# Patient Record
Sex: Male | Born: 1960 | Race: White | Hispanic: No | Marital: Single | Smoking: Current every day smoker
Health system: Southern US, Community
[De-identification: ages and names within clinical notes are randomized; demographics above are authoritative.]

## PROBLEM LIST (undated history)

## (undated) VITALS — BP 90/61 | HR 90 | Temp 98.1°F | Resp 16 | Ht 73.0 in | Wt 143.0 lb

## (undated) DIAGNOSIS — R569 Unspecified convulsions: Secondary | ICD-10-CM

## (undated) DIAGNOSIS — J449 Chronic obstructive pulmonary disease, unspecified: Secondary | ICD-10-CM

## (undated) DIAGNOSIS — F419 Anxiety disorder, unspecified: Secondary | ICD-10-CM

## (undated) DIAGNOSIS — F1010 Alcohol abuse, uncomplicated: Secondary | ICD-10-CM

## (undated) DIAGNOSIS — F329 Major depressive disorder, single episode, unspecified: Secondary | ICD-10-CM

## (undated) DIAGNOSIS — F32A Depression, unspecified: Secondary | ICD-10-CM

## (undated) DIAGNOSIS — I639 Cerebral infarction, unspecified: Secondary | ICD-10-CM

## (undated) DIAGNOSIS — B1920 Unspecified viral hepatitis C without hepatic coma: Secondary | ICD-10-CM

## (undated) DIAGNOSIS — F1410 Cocaine abuse, uncomplicated: Secondary | ICD-10-CM

## (undated) HISTORY — PX: NO PAST SURGERIES: SHX2092

---

## 2000-01-31 ENCOUNTER — Emergency Department (HOSPITAL_COMMUNITY): Admission: EM | Admit: 2000-01-31 | Discharge: 2000-01-31 | Payer: Self-pay | Admitting: *Deleted

## 2001-04-30 ENCOUNTER — Emergency Department (HOSPITAL_COMMUNITY): Admission: EM | Admit: 2001-04-30 | Discharge: 2001-04-30 | Payer: Self-pay | Admitting: *Deleted

## 2004-06-06 ENCOUNTER — Emergency Department (HOSPITAL_COMMUNITY): Admission: EM | Admit: 2004-06-06 | Discharge: 2004-06-06 | Payer: Self-pay | Admitting: Emergency Medicine

## 2005-02-17 ENCOUNTER — Emergency Department (HOSPITAL_COMMUNITY): Admission: EM | Admit: 2005-02-17 | Discharge: 2005-02-17 | Payer: Self-pay | Admitting: *Deleted

## 2006-12-15 ENCOUNTER — Emergency Department (HOSPITAL_COMMUNITY): Admission: EM | Admit: 2006-12-15 | Discharge: 2006-12-15 | Payer: Self-pay | Admitting: Emergency Medicine

## 2007-04-07 ENCOUNTER — Emergency Department (HOSPITAL_COMMUNITY): Admission: EM | Admit: 2007-04-07 | Discharge: 2007-04-07 | Payer: Self-pay | Admitting: Emergency Medicine

## 2007-04-29 ENCOUNTER — Emergency Department (HOSPITAL_COMMUNITY): Admission: EM | Admit: 2007-04-29 | Discharge: 2007-04-29 | Payer: Self-pay | Admitting: Emergency Medicine

## 2008-11-25 ENCOUNTER — Emergency Department (HOSPITAL_COMMUNITY): Admission: EM | Admit: 2008-11-25 | Discharge: 2008-11-25 | Payer: Self-pay | Admitting: Emergency Medicine

## 2008-12-01 ENCOUNTER — Emergency Department (HOSPITAL_COMMUNITY): Admission: EM | Admit: 2008-12-01 | Discharge: 2008-12-01 | Payer: Self-pay | Admitting: Emergency Medicine

## 2009-01-26 ENCOUNTER — Emergency Department (HOSPITAL_COMMUNITY): Admission: EM | Admit: 2009-01-26 | Discharge: 2009-01-26 | Payer: Self-pay | Admitting: Emergency Medicine

## 2009-01-30 ENCOUNTER — Emergency Department (HOSPITAL_COMMUNITY): Admission: EM | Admit: 2009-01-30 | Discharge: 2009-01-30 | Payer: Self-pay | Admitting: Emergency Medicine

## 2009-02-06 ENCOUNTER — Inpatient Hospital Stay (HOSPITAL_COMMUNITY): Admission: EM | Admit: 2009-02-06 | Discharge: 2009-02-07 | Payer: Self-pay | Admitting: Emergency Medicine

## 2009-06-27 ENCOUNTER — Emergency Department (HOSPITAL_COMMUNITY): Admission: EM | Admit: 2009-06-27 | Discharge: 2009-06-27 | Payer: Self-pay | Admitting: Emergency Medicine

## 2009-12-14 ENCOUNTER — Emergency Department (HOSPITAL_COMMUNITY): Admission: EM | Admit: 2009-12-14 | Discharge: 2009-12-14 | Payer: Self-pay | Admitting: Emergency Medicine

## 2009-12-18 ENCOUNTER — Emergency Department (HOSPITAL_COMMUNITY): Admission: EM | Admit: 2009-12-18 | Discharge: 2009-12-18 | Payer: Self-pay | Admitting: Emergency Medicine

## 2009-12-25 ENCOUNTER — Emergency Department (HOSPITAL_COMMUNITY): Admission: EM | Admit: 2009-12-25 | Discharge: 2009-12-26 | Payer: Self-pay | Admitting: Emergency Medicine

## 2010-02-04 ENCOUNTER — Emergency Department (HOSPITAL_BASED_OUTPATIENT_CLINIC_OR_DEPARTMENT_OTHER): Admission: EM | Admit: 2010-02-04 | Discharge: 2010-02-04 | Payer: Self-pay | Admitting: Emergency Medicine

## 2010-02-04 ENCOUNTER — Ambulatory Visit: Payer: Self-pay | Admitting: Diagnostic Radiology

## 2010-03-25 ENCOUNTER — Emergency Department (HOSPITAL_COMMUNITY): Admission: EM | Admit: 2010-03-25 | Discharge: 2010-03-26 | Payer: Self-pay | Admitting: Emergency Medicine

## 2010-03-28 ENCOUNTER — Emergency Department (HOSPITAL_COMMUNITY): Admission: EM | Admit: 2010-03-28 | Discharge: 2010-03-29 | Payer: Self-pay | Admitting: Emergency Medicine

## 2010-03-29 ENCOUNTER — Ambulatory Visit: Payer: Self-pay | Admitting: Psychiatry

## 2010-04-02 ENCOUNTER — Emergency Department (HOSPITAL_COMMUNITY): Admission: EM | Admit: 2010-04-02 | Discharge: 2010-04-03 | Payer: Self-pay | Admitting: Emergency Medicine

## 2010-05-23 ENCOUNTER — Emergency Department (HOSPITAL_COMMUNITY)
Admission: EM | Admit: 2010-05-23 | Discharge: 2010-05-24 | Payer: Self-pay | Source: Home / Self Care | Admitting: Emergency Medicine

## 2010-05-29 ENCOUNTER — Emergency Department (HOSPITAL_COMMUNITY): Admission: EM | Admit: 2010-05-29 | Discharge: 2010-05-30 | Payer: Self-pay | Admitting: Emergency Medicine

## 2010-08-26 ENCOUNTER — Observation Stay (HOSPITAL_COMMUNITY)
Admission: EM | Admit: 2010-08-26 | Discharge: 2010-08-27 | Payer: Self-pay | Source: Home / Self Care | Attending: Internal Medicine | Admitting: Internal Medicine

## 2010-09-01 ENCOUNTER — Ambulatory Visit: Admit: 2010-09-01 | Payer: Self-pay

## 2010-09-19 ENCOUNTER — Encounter: Payer: Self-pay | Admitting: Cardiology

## 2010-10-28 ENCOUNTER — Emergency Department (HOSPITAL_COMMUNITY)
Admission: EM | Admit: 2010-10-28 | Discharge: 2010-10-28 | Disposition: A | Discharge status: Home / Self Care | Payer: Self-pay | Attending: Emergency Medicine | Admitting: Emergency Medicine

## 2010-10-28 ENCOUNTER — Emergency Department (HOSPITAL_COMMUNITY): Payer: Self-pay

## 2010-10-28 DIAGNOSIS — S42209A Unspecified fracture of upper end of unspecified humerus, initial encounter for closed fracture: Secondary | ICD-10-CM | POA: Insufficient documentation

## 2010-10-28 DIAGNOSIS — M25519 Pain in unspecified shoulder: Secondary | ICD-10-CM | POA: Insufficient documentation

## 2010-10-28 DIAGNOSIS — W010XXA Fall on same level from slipping, tripping and stumbling without subsequent striking against object, initial encounter: Secondary | ICD-10-CM | POA: Insufficient documentation

## 2010-11-07 LAB — URINALYSIS, ROUTINE W REFLEX MICROSCOPIC
Bilirubin Urine: NEGATIVE
Glucose, UA: 250 mg/dL — AB
Hgb urine dipstick: NEGATIVE
Ketones, ur: NEGATIVE mg/dL
Nitrite: NEGATIVE
Specific Gravity, Urine: 1.016 (ref 1.005–1.030)
pH: 6.5 (ref 5.0–8.0)

## 2010-11-07 LAB — RAPID URINE DRUG SCREEN, HOSP PERFORMED
Barbiturates: NOT DETECTED
Cocaine: NOT DETECTED
Opiates: NOT DETECTED
Tetrahydrocannabinol: NOT DETECTED

## 2010-11-07 LAB — CBC
HCT: 38.9 % — ABNORMAL LOW (ref 39.0–52.0)
Hemoglobin: 13.1 g/dL (ref 13.0–17.0)
MCH: 30.3 pg (ref 26.0–34.0)
MCV: 90 fL (ref 78.0–100.0)
Platelets: 133 10*3/uL — ABNORMAL LOW (ref 150–400)
RBC: 4.3 MIL/uL (ref 4.22–5.81)
RBC: 4.33 MIL/uL (ref 4.22–5.81)
RDW: 13.3 % (ref 11.5–15.5)
WBC: 6.1 10*3/uL (ref 4.0–10.5)

## 2010-11-07 LAB — DIFFERENTIAL
Basophils Absolute: 0 10*3/uL (ref 0.0–0.1)
Eosinophils Absolute: 0 10*3/uL (ref 0.0–0.7)
Eosinophils Absolute: 0.1 10*3/uL (ref 0.0–0.7)
Eosinophils Relative: 2 % (ref 0–5)
Lymphocytes Relative: 35 % (ref 12–46)
Lymphs Abs: 0.9 10*3/uL (ref 0.7–4.0)
Monocytes Absolute: 0.2 10*3/uL (ref 0.1–1.0)
Monocytes Relative: 5 % (ref 3–12)
Neutro Abs: 3.7 10*3/uL (ref 1.7–7.7)
Neutrophils Relative %: 55 % (ref 43–77)
Neutrophils Relative %: 77 % (ref 43–77)

## 2010-11-07 LAB — BASIC METABOLIC PANEL
BUN: 9 mg/dL (ref 6–23)
CO2: 27 mEq/L (ref 19–32)
Calcium: 8.5 mg/dL (ref 8.4–10.5)
Calcium: 8.6 mg/dL (ref 8.4–10.5)
Chloride: 107 mEq/L (ref 96–112)
Creatinine, Ser: 0.73 mg/dL (ref 0.4–1.5)
GFR calc Af Amer: >60 mL/min (ref >60)
GFR calc Af Amer: >60 mL/min (ref >60)
Potassium: 4.7 mEq/L (ref 3.5–5.1)
Sodium: 140 mEq/L (ref 135–145)

## 2010-11-07 LAB — BETA-HYDROXYBUTYRIC ACID

## 2010-11-07 LAB — INSULIN, RANDOM: Insulin: 4 u[IU]/mL (ref 3–28)

## 2010-11-07 LAB — GLUCOSE, CAPILLARY
Glucose-Capillary: 109 mg/dL — ABNORMAL HIGH (ref 70–99)
Glucose-Capillary: 130 mg/dL — ABNORMAL HIGH (ref 70–99)
Glucose-Capillary: 133 mg/dL — ABNORMAL HIGH (ref 70–99)
Glucose-Capillary: 149 mg/dL — ABNORMAL HIGH (ref 70–99)
Glucose-Capillary: 150 mg/dL — ABNORMAL HIGH (ref 70–99)
Glucose-Capillary: 84 mg/dL (ref 70–99)

## 2010-11-07 LAB — HEPATIC FUNCTION PANEL
AST: 111 U/L — ABNORMAL HIGH (ref 0–37)
Albumin: 3.4 g/dL — ABNORMAL LOW (ref 3.5–5.2)
Alkaline Phosphatase: 90 U/L (ref 39–117)
Total Bilirubin: 0.5 mg/dL (ref 0.3–1.2)

## 2010-11-07 LAB — C-PEPTIDE: C-Peptide: 0.9 ng/mL (ref 0.80–3.90)

## 2010-11-09 LAB — RAPID URINE DRUG SCREEN, HOSP PERFORMED
Cocaine: NOT DETECTED
Tetrahydrocannabinol: NOT DETECTED

## 2010-11-09 LAB — POCT CARDIAC MARKERS
CKMB, poc: 3.3 ng/mL (ref 1.0–8.0)
CKMB, poc: 3.5 ng/mL (ref 1.0–8.0)
Myoglobin, poc: 106 ng/mL (ref 12–200)
Troponin i, poc: <0.05 ng/mL (ref 0.00–0.09)

## 2010-11-09 LAB — DIFFERENTIAL
Eosinophils Absolute: 0 10*3/uL (ref 0.0–0.7)
Eosinophils Relative: 0 % (ref 0–5)
Lymphs Abs: 1.4 10*3/uL (ref 0.7–4.0)
Monocytes Absolute: 0.4 10*3/uL (ref 0.1–1.0)
Monocytes Relative: 4 % (ref 3–12)

## 2010-11-09 LAB — CBC
Hemoglobin: 13.2 g/dL (ref 13.0–17.0)
MCH: 30.8 pg (ref 26.0–34.0)
MCHC: 35 g/dL (ref 30.0–36.0)
MCV: 88.1 fL (ref 78.0–100.0)
RBC: 4.28 MIL/uL (ref 4.22–5.81)

## 2010-11-09 LAB — POCT I-STAT, CHEM 8
Calcium, Ion: 1.06 mmol/L — ABNORMAL LOW (ref 1.12–1.32)
Creatinine, Ser: 0.8 mg/dL (ref 0.4–1.5)
Glucose, Bld: 119 mg/dL — ABNORMAL HIGH (ref 70–99)
Hemoglobin: 14.6 g/dL (ref 13.0–17.0)
TCO2: 23 mmol/L (ref 0–100)

## 2010-11-10 LAB — RAPID URINE DRUG SCREEN, HOSP PERFORMED
Amphetamines: NOT DETECTED
Barbiturates: NOT DETECTED
Opiates: NOT DETECTED

## 2010-11-10 LAB — URINALYSIS, ROUTINE W REFLEX MICROSCOPIC
Hgb urine dipstick: NEGATIVE
Specific Gravity, Urine: 1.004 — ABNORMAL LOW (ref 1.005–1.030)
Urobilinogen, UA: 0.2 mg/dL (ref 0.0–1.0)

## 2010-11-10 LAB — CBC
HCT: 39.1 % (ref 39.0–52.0)
MCV: 88.3 fL (ref 78.0–100.0)
RBC: 4.43 MIL/uL (ref 4.22–5.81)
RDW: 13.1 % (ref 11.5–15.5)
WBC: 5.8 10*3/uL (ref 4.0–10.5)

## 2010-11-10 LAB — COMPREHENSIVE METABOLIC PANEL
Alkaline Phosphatase: 123 U/L — ABNORMAL HIGH (ref 39–117)
BUN: 7 mg/dL (ref 6–23)
CO2: 25 mEq/L (ref 19–32)
Chloride: 108 mEq/L (ref 96–112)
Glucose, Bld: 88 mg/dL (ref 70–99)
Potassium: 3.7 mEq/L (ref 3.5–5.1)
Total Bilirubin: 0.5 mg/dL (ref 0.3–1.2)

## 2010-11-10 LAB — DIFFERENTIAL
Basophils Absolute: 0.1 10*3/uL (ref 0.0–0.1)
Basophils Relative: 1 % (ref 0–1)
Neutro Abs: 2.3 10*3/uL (ref 1.7–7.7)
Neutrophils Relative %: 40 % — ABNORMAL LOW (ref 43–77)

## 2010-11-11 ENCOUNTER — Emergency Department (HOSPITAL_COMMUNITY): Payer: Self-pay

## 2010-11-11 ENCOUNTER — Emergency Department (HOSPITAL_COMMUNITY)
Admission: EM | Admit: 2010-11-11 | Discharge: 2010-11-11 | Disposition: A | Discharge status: Home / Self Care | Payer: Self-pay | Attending: Emergency Medicine | Admitting: Emergency Medicine

## 2010-11-11 DIAGNOSIS — S42209A Unspecified fracture of upper end of unspecified humerus, initial encounter for closed fracture: Secondary | ICD-10-CM | POA: Insufficient documentation

## 2010-11-11 DIAGNOSIS — M25429 Effusion, unspecified elbow: Secondary | ICD-10-CM | POA: Insufficient documentation

## 2010-11-11 DIAGNOSIS — M25619 Stiffness of unspecified shoulder, not elsewhere classified: Secondary | ICD-10-CM | POA: Insufficient documentation

## 2010-11-11 DIAGNOSIS — M25519 Pain in unspecified shoulder: Secondary | ICD-10-CM | POA: Insufficient documentation

## 2010-11-11 DIAGNOSIS — G40909 Epilepsy, unspecified, not intractable, without status epilepticus: Secondary | ICD-10-CM | POA: Insufficient documentation

## 2010-11-11 DIAGNOSIS — W19XXXA Unspecified fall, initial encounter: Secondary | ICD-10-CM | POA: Insufficient documentation

## 2010-11-11 DIAGNOSIS — M25529 Pain in unspecified elbow: Secondary | ICD-10-CM | POA: Insufficient documentation

## 2010-11-11 DIAGNOSIS — Y929 Unspecified place or not applicable: Secondary | ICD-10-CM | POA: Insufficient documentation

## 2010-11-11 LAB — DIFFERENTIAL
Basophils Absolute: 0 10*3/uL (ref 0.0–0.1)
Basophils Relative: 0 % (ref 0–1)
Basophils Relative: 1 % (ref 0–1)
Eosinophils Relative: 3 % (ref 0–5)
Lymphocytes Relative: 43 % (ref 12–46)
Lymphocytes Relative: 52 % — ABNORMAL HIGH (ref 12–46)
Lymphs Abs: 2.4 10*3/uL (ref 0.7–4.0)
Monocytes Absolute: 0.4 10*3/uL (ref 0.1–1.0)
Monocytes Absolute: 0.4 10*3/uL (ref 0.1–1.0)
Monocytes Relative: 10 % (ref 3–12)
Monocytes Relative: 9 % (ref 3–12)
Neutro Abs: 1.7 10*3/uL (ref 1.7–7.7)

## 2010-11-11 LAB — COMPREHENSIVE METABOLIC PANEL
AST: 343 U/L — ABNORMAL HIGH (ref 0–37)
Albumin: 2.9 g/dL — ABNORMAL LOW (ref 3.5–5.2)
Alkaline Phosphatase: 89 U/L (ref 39–117)
Chloride: 109 mEq/L (ref 96–112)
GFR calc Af Amer: >60 mL/min (ref >60)
Potassium: 3.3 mEq/L — ABNORMAL LOW (ref 3.5–5.1)
Total Bilirubin: 0.7 mg/dL (ref 0.3–1.2)
Total Protein: 5.9 g/dL — ABNORMAL LOW (ref 6.0–8.3)

## 2010-11-11 LAB — URINALYSIS, ROUTINE W REFLEX MICROSCOPIC
Bilirubin Urine: NEGATIVE
Ketones, ur: NEGATIVE mg/dL
Nitrite: NEGATIVE
Urobilinogen, UA: 0.2 mg/dL (ref 0.0–1.0)
pH: 6 (ref 5.0–8.0)

## 2010-11-11 LAB — RAPID URINE DRUG SCREEN, HOSP PERFORMED
Barbiturates: NOT DETECTED
Benzodiazepines: NOT DETECTED
Cocaine: NOT DETECTED
Opiates: NOT DETECTED
Tetrahydrocannabinol: NOT DETECTED
Tetrahydrocannabinol: NOT DETECTED

## 2010-11-11 LAB — CBC
HCT: 40.5 % (ref 39.0–52.0)
Hemoglobin: 13.7 g/dL (ref 13.0–17.0)
MCHC: 33.8 g/dL (ref 30.0–36.0)
Platelets: 124 10*3/uL — ABNORMAL LOW (ref 150–400)
RBC: 3.63 MIL/uL — ABNORMAL LOW (ref 4.22–5.81)
RBC: 4.53 MIL/uL (ref 4.22–5.81)
RDW: 13.5 % (ref 11.5–15.5)
WBC: 3.8 10*3/uL — ABNORMAL LOW (ref 4.0–10.5)

## 2010-11-11 LAB — POCT CARDIAC MARKERS
CKMB, poc: 2.8 ng/mL (ref 1.0–8.0)
Troponin i, poc: <0.05 ng/mL (ref 0.00–0.09)

## 2010-11-11 LAB — BASIC METABOLIC PANEL
GFR calc non Af Amer: >60 mL/min (ref >60)
Glucose, Bld: 125 mg/dL — ABNORMAL HIGH (ref 70–99)
Potassium: 3.5 mEq/L (ref 3.5–5.1)
Sodium: 140 mEq/L (ref 135–145)

## 2010-11-11 LAB — ETHANOL: Alcohol, Ethyl (B): 244 mg/dL — ABNORMAL HIGH (ref 0–10)

## 2010-11-11 LAB — TRICYCLICS SCREEN, URINE: TCA Scrn: NOT DETECTED

## 2010-11-12 LAB — CBC
HCT: 37.8 % — ABNORMAL LOW (ref 39.0–52.0)
MCH: 31.6 pg (ref 26.0–34.0)
MCHC: 33.7 g/dL (ref 30.0–36.0)
MCV: 93.8 fL (ref 78.0–100.0)
Platelets: 128 10*3/uL — ABNORMAL LOW (ref 150–400)
RDW: 13.6 % (ref 11.5–15.5)
WBC: 6.5 10*3/uL (ref 4.0–10.5)

## 2010-11-12 LAB — URINALYSIS, ROUTINE W REFLEX MICROSCOPIC
Bilirubin Urine: NEGATIVE
Glucose, UA: NEGATIVE mg/dL
Nitrite: NEGATIVE
Specific Gravity, Urine: 1.005 (ref 1.005–1.030)
pH: 5.5 (ref 5.0–8.0)

## 2010-11-12 LAB — COMPREHENSIVE METABOLIC PANEL
Albumin: 3.6 g/dL (ref 3.5–5.2)
Alkaline Phosphatase: 108 U/L (ref 39–117)
BUN: 6 mg/dL (ref 6–23)
Calcium: 8.7 mg/dL (ref 8.4–10.5)
Creatinine, Ser: 0.73 mg/dL (ref 0.4–1.5)
Glucose, Bld: 78 mg/dL (ref 70–99)
Total Protein: 7.2 g/dL (ref 6.0–8.3)

## 2010-11-12 LAB — APTT: aPTT: 33 seconds (ref 24–37)

## 2010-11-12 LAB — DIFFERENTIAL
Basophils Relative: 1 % (ref 0–1)
Lymphocytes Relative: 38 % (ref 12–46)
Lymphs Abs: 2.5 10*3/uL (ref 0.7–4.0)
Monocytes Absolute: 0.5 10*3/uL (ref 0.1–1.0)
Monocytes Relative: 8 % (ref 3–12)
Neutro Abs: 3.2 10*3/uL (ref 1.7–7.7)
Neutrophils Relative %: 50 % (ref 43–77)

## 2010-11-12 LAB — RAPID URINE DRUG SCREEN, HOSP PERFORMED
Opiates: NOT DETECTED
Tetrahydrocannabinol: NOT DETECTED

## 2010-11-12 LAB — PROTIME-INR
INR: 1 (ref 0.00–1.49)
Prothrombin Time: 13.1 seconds (ref 11.6–15.2)

## 2010-11-14 LAB — URINALYSIS, ROUTINE W REFLEX MICROSCOPIC
Glucose, UA: NEGATIVE mg/dL
Hgb urine dipstick: NEGATIVE
Ketones, ur: NEGATIVE mg/dL
Protein, ur: NEGATIVE mg/dL

## 2010-11-14 LAB — CBC
HCT: 39.6 % (ref 39.0–52.0)
Hemoglobin: 13.3 g/dL (ref 13.0–17.0)
RBC: 4.26 MIL/uL (ref 4.22–5.81)

## 2010-11-14 LAB — BASIC METABOLIC PANEL
Calcium: 8.6 mg/dL (ref 8.4–10.5)
GFR calc Af Amer: >60 mL/min (ref >60)
GFR calc non Af Amer: >60 mL/min (ref >60)
Potassium: 4.4 mEq/L (ref 3.5–5.1)
Sodium: 144 mEq/L (ref 135–145)

## 2010-11-14 LAB — DIFFERENTIAL
Lymphocytes Relative: 48 % — ABNORMAL HIGH (ref 12–46)
Lymphs Abs: 3.1 10*3/uL (ref 0.7–4.0)
Monocytes Absolute: 0.7 10*3/uL (ref 0.1–1.0)
Monocytes Relative: 10 % (ref 3–12)
Neutro Abs: 2.3 10*3/uL (ref 1.7–7.7)

## 2010-11-14 LAB — POCT TOXICOLOGY PANEL: Benzodiazepines: POSITIVE

## 2010-11-15 LAB — POCT I-STAT, CHEM 8
Creatinine, Ser: 1 mg/dL (ref 0.4–1.5)
Glucose, Bld: 71 mg/dL (ref 70–99)
Hemoglobin: 14.6 g/dL (ref 13.0–17.0)
Potassium: 3.8 mEq/L (ref 3.5–5.1)

## 2010-11-15 LAB — RAPID URINE DRUG SCREEN, HOSP PERFORMED
Amphetamines: NOT DETECTED
Barbiturates: NOT DETECTED

## 2010-11-15 LAB — POCT CARDIAC MARKERS
CKMB, poc: 1.8 ng/mL (ref 1.0–8.0)
CKMB, poc: 4.3 ng/mL (ref 1.0–8.0)
Myoglobin, poc: 124 ng/mL (ref 12–200)
Troponin i, poc: <0.05 ng/mL (ref 0.00–0.09)
Troponin i, poc: <0.05 ng/mL (ref 0.00–0.09)

## 2010-11-15 LAB — CBC
HCT: 38.5 % — ABNORMAL LOW (ref 39.0–52.0)
MCV: 91.7 fL (ref 78.0–100.0)
RBC: 4.21 MIL/uL — ABNORMAL LOW (ref 4.22–5.81)
WBC: 5.9 10*3/uL (ref 4.0–10.5)

## 2010-11-15 LAB — DIFFERENTIAL
Eosinophils Absolute: 0.1 10*3/uL (ref 0.0–0.7)
Lymphs Abs: 2.6 10*3/uL (ref 0.7–4.0)
Monocytes Relative: 7 % (ref 3–12)
Neutrophils Relative %: 47 % (ref 43–77)

## 2010-11-25 ENCOUNTER — Emergency Department (HOSPITAL_COMMUNITY)
Admission: EM | Admit: 2010-11-25 | Discharge: 2010-11-25 | Disposition: A | Discharge status: Home / Self Care | Payer: Self-pay | Attending: Emergency Medicine | Admitting: Emergency Medicine

## 2010-11-25 DIAGNOSIS — G40909 Epilepsy, unspecified, not intractable, without status epilepticus: Secondary | ICD-10-CM | POA: Insufficient documentation

## 2010-11-25 DIAGNOSIS — M25519 Pain in unspecified shoulder: Secondary | ICD-10-CM | POA: Insufficient documentation

## 2010-11-25 DIAGNOSIS — Z09 Encounter for follow-up examination after completed treatment for conditions other than malignant neoplasm: Secondary | ICD-10-CM | POA: Insufficient documentation

## 2010-11-25 DIAGNOSIS — X58XXXA Exposure to other specified factors, initial encounter: Secondary | ICD-10-CM | POA: Insufficient documentation

## 2010-11-25 DIAGNOSIS — S42209A Unspecified fracture of upper end of unspecified humerus, initial encounter for closed fracture: Secondary | ICD-10-CM | POA: Insufficient documentation

## 2010-11-25 DIAGNOSIS — G8929 Other chronic pain: Secondary | ICD-10-CM | POA: Insufficient documentation

## 2010-12-01 LAB — RAPID URINE DRUG SCREEN, HOSP PERFORMED
Amphetamines: NOT DETECTED
Barbiturates: NOT DETECTED
Benzodiazepines: NOT DETECTED
Opiates: POSITIVE — AB

## 2010-12-01 LAB — POCT I-STAT, CHEM 8
BUN: 5 mg/dL — ABNORMAL LOW (ref 6–23)
Calcium, Ion: 1.09 mmol/L — ABNORMAL LOW (ref 1.12–1.32)
Chloride: 104 mEq/L (ref 96–112)
Creatinine, Ser: 0.7 mg/dL (ref 0.4–1.5)
Glucose, Bld: 94 mg/dL (ref 70–99)
HCT: 45 % (ref 39.0–52.0)
Hemoglobin: 15.3 g/dL (ref 13.0–17.0)
Potassium: 4.4 meq/L (ref 3.5–5.1)
Sodium: 143 meq/L (ref 135–145)
TCO2: 26 mmol/L (ref 0–100)

## 2010-12-01 LAB — DIFFERENTIAL
Lymphocytes Relative: 45 % (ref 12–46)
Lymphs Abs: 2.8 10*3/uL (ref 0.7–4.0)
Monocytes Relative: 5 % (ref 3–12)
Neutro Abs: 3.1 10*3/uL (ref 1.7–7.7)
Neutrophils Relative %: 49 % (ref 43–77)

## 2010-12-01 LAB — URINALYSIS, ROUTINE W REFLEX MICROSCOPIC
Glucose, UA: NEGATIVE mg/dL
Hgb urine dipstick: NEGATIVE
Specific Gravity, Urine: 1.008 (ref 1.005–1.030)
pH: 5.5 (ref 5.0–8.0)

## 2010-12-01 LAB — CBC
MCV: 91.7 fL (ref 78.0–100.0)
RBC: 4.42 MIL/uL (ref 4.22–5.81)
WBC: 6.4 10*3/uL (ref 4.0–10.5)

## 2010-12-01 LAB — ETHANOL: Alcohol, Ethyl (B): 243 mg/dL — ABNORMAL HIGH (ref 0–10)

## 2010-12-05 LAB — POCT I-STAT, CHEM 8
BUN: <3 mg/dL — ABNORMAL LOW (ref 6–23)
BUN: 8 mg/dL (ref 6–23)
Chloride: 102 mEq/L (ref 96–112)
Chloride: 105 mEq/L (ref 96–112)
Creatinine, Ser: 0.9 mg/dL (ref 0.4–1.5)
Creatinine, Ser: 0.8 mg/dL (ref 0.4–1.5)
Glucose, Bld: 93 mg/dL (ref 70–99)
Hemoglobin: 13.3 g/dL (ref 13.0–17.0)
Potassium: 4.1 mEq/L (ref 3.5–5.1)
Sodium: 141 mEq/L (ref 135–145)
Sodium: 138 mEq/L (ref 135–145)
TCO2: 22 mmol/L (ref 0–100)

## 2010-12-05 LAB — BASIC METABOLIC PANEL
BUN: 6 mg/dL (ref 6–23)
Creatinine, Ser: 0.86 mg/dL (ref 0.4–1.5)
GFR calc non Af Amer: >60 mL/min (ref >60)
Potassium: 4.5 mEq/L (ref 3.5–5.1)

## 2010-12-05 LAB — TROPONIN I: Troponin I: 0.01 ng/mL (ref 0.00–0.06)

## 2010-12-05 LAB — URINALYSIS, ROUTINE W REFLEX MICROSCOPIC
Bilirubin Urine: NEGATIVE
Bilirubin Urine: NEGATIVE
Glucose, UA: NEGATIVE mg/dL
Hgb urine dipstick: NEGATIVE
Hgb urine dipstick: NEGATIVE
Ketones, ur: NEGATIVE mg/dL
Nitrite: NEGATIVE
Specific Gravity, Urine: 1.006 (ref 1.005–1.030)
Urobilinogen, UA: 1 mg/dL (ref 0.0–1.0)
pH: 6.5 (ref 5.0–8.0)
pH: 5.5 (ref 5.0–8.0)

## 2010-12-05 LAB — DIFFERENTIAL
Eosinophils Absolute: 0.1 10*3/uL (ref 0.0–0.7)
Lymphocytes Relative: 40 % (ref 12–46)
Lymphocytes Relative: 40 % (ref 12–46)
Lymphs Abs: 1.9 10*3/uL (ref 0.7–4.0)
Lymphs Abs: 2.5 10*3/uL (ref 0.7–4.0)
Monocytes Relative: 9 % (ref 3–12)
Neutro Abs: 2.4 10*3/uL (ref 1.7–7.7)
Neutrophils Relative %: 48 % (ref 43–77)
Neutrophils Relative %: 51 % (ref 43–77)

## 2010-12-05 LAB — RAPID URINE DRUG SCREEN, HOSP PERFORMED
Amphetamines: NOT DETECTED
Amphetamines: NOT DETECTED
Barbiturates: NOT DETECTED
Barbiturates: NOT DETECTED
Cocaine: NOT DETECTED
Opiates: NOT DETECTED
Tetrahydrocannabinol: NOT DETECTED

## 2010-12-05 LAB — CBC
HCT: 35 % — ABNORMAL LOW (ref 39.0–52.0)
HCT: 37.5 % — ABNORMAL LOW (ref 39.0–52.0)
MCV: 91.7 fL (ref 78.0–100.0)
MCV: 94.2 fL (ref 78.0–100.0)
Platelets: 168 10*3/uL (ref 150–400)
Platelets: 221 10*3/uL (ref 150–400)
RBC: 3.92 MIL/uL — ABNORMAL LOW (ref 4.22–5.81)
RBC: 3.72 MIL/uL — ABNORMAL LOW (ref 4.22–5.81)
WBC: 4.9 10*3/uL (ref 4.0–10.5)
WBC: 4.9 10*3/uL (ref 4.0–10.5)
WBC: 6.4 10*3/uL (ref 4.0–10.5)

## 2010-12-05 LAB — URINE CULTURE: Colony Count: NO GROWTH

## 2010-12-05 LAB — CARDIAC PANEL(CRET KIN+CKTOT+MB+TROPI)
CK, MB: 4 ng/mL (ref 0.3–4.0)
Relative Index: 1.4 (ref 0.0–2.5)
Relative Index: 1.8 (ref 0.0–2.5)
Total CK: 291 U/L — ABNORMAL HIGH (ref 7–232)
Troponin I: 0.02 ng/mL (ref 0.00–0.06)

## 2010-12-05 LAB — CK TOTAL AND CKMB (NOT AT ARMC)
CK, MB: 4.5 ng/mL — ABNORMAL HIGH (ref 0.3–4.0)
CK, MB: 7.1 ng/mL — ABNORMAL HIGH (ref 0.3–4.0)
Relative Index: 2.7 — ABNORMAL HIGH (ref 0.0–2.5)
Total CK: 196 U/L (ref 7–232)
Total CK: 263 U/L — ABNORMAL HIGH (ref 7–232)

## 2010-12-05 LAB — TSH: TSH: 1.752 u[IU]/mL (ref 0.350–4.500)

## 2010-12-05 LAB — POCT CARDIAC MARKERS
Myoglobin, poc: 96 ng/mL (ref 12–200)
Troponin i, poc: <0.05 ng/mL (ref 0.00–0.09)
Troponin i, poc: <0.05 ng/mL (ref 0.00–0.09)

## 2010-12-05 LAB — GLUCOSE, CAPILLARY: Glucose-Capillary: 71 mg/dL (ref 70–99)

## 2010-12-07 LAB — CBC
MCHC: 34.1 g/dL (ref 30.0–36.0)
Platelets: 165 10*3/uL (ref 150–400)
RBC: 3.8 MIL/uL — ABNORMAL LOW (ref 4.22–5.81)
RDW: 14.5 % (ref 11.5–15.5)

## 2010-12-07 LAB — DIFFERENTIAL
Basophils Absolute: 0 10*3/uL (ref 0.0–0.1)
Basophils Relative: 1 % (ref 0–1)
Eosinophils Absolute: 0.1 10*3/uL (ref 0.0–0.7)
Neutro Abs: 1.1 10*3/uL — ABNORMAL LOW (ref 1.7–7.7)
Neutrophils Relative %: 39 % — ABNORMAL LOW (ref 43–77)

## 2010-12-07 LAB — URINALYSIS, ROUTINE W REFLEX MICROSCOPIC
Glucose, UA: NEGATIVE mg/dL
Hgb urine dipstick: NEGATIVE
Protein, ur: NEGATIVE mg/dL
Specific Gravity, Urine: 1.011 (ref 1.005–1.030)
Urobilinogen, UA: 0.2 mg/dL (ref 0.0–1.0)

## 2010-12-07 LAB — POCT I-STAT, CHEM 8
Chloride: 99 mEq/L (ref 96–112)
HCT: 38 % — ABNORMAL LOW (ref 39.0–52.0)
Hemoglobin: 12.9 g/dL — ABNORMAL LOW (ref 13.0–17.0)
Potassium: 3.7 mEq/L (ref 3.5–5.1)
Sodium: 136 mEq/L (ref 135–145)

## 2010-12-07 LAB — RAPID URINE DRUG SCREEN, HOSP PERFORMED: Barbiturates: NOT DETECTED

## 2010-12-07 LAB — GLUCOSE, CAPILLARY: Glucose-Capillary: 80 mg/dL (ref 70–99)

## 2010-12-08 LAB — DIFFERENTIAL
Lymphocytes Relative: 26 % (ref 12–46)
Lymphs Abs: 1.7 10*3/uL (ref 0.7–4.0)
Monocytes Absolute: 0.5 10*3/uL (ref 0.1–1.0)
Monocytes Relative: 7 % (ref 3–12)
Neutro Abs: 4.1 10*3/uL (ref 1.7–7.7)
Neutrophils Relative %: 63 % (ref 43–77)

## 2010-12-08 LAB — COMPREHENSIVE METABOLIC PANEL
Albumin: 3.5 g/dL (ref 3.5–5.2)
Alkaline Phosphatase: 87 U/L (ref 39–117)
BUN: 16 mg/dL (ref 6–23)
Calcium: 8.8 mg/dL (ref 8.4–10.5)
Glucose, Bld: 65 mg/dL — ABNORMAL LOW (ref 70–99)
Potassium: 3.5 mEq/L (ref 3.5–5.1)
Sodium: 133 mEq/L — ABNORMAL LOW (ref 135–145)
Total Protein: 6.5 g/dL (ref 6.0–8.3)

## 2010-12-08 LAB — CBC
HCT: 38 % — ABNORMAL LOW (ref 39.0–52.0)
Hemoglobin: 12.8 g/dL — ABNORMAL LOW (ref 13.0–17.0)
MCHC: 33.7 g/dL (ref 30.0–36.0)
Platelets: 141 10*3/uL — ABNORMAL LOW (ref 150–400)
RDW: 14.4 % (ref 11.5–15.5)

## 2010-12-08 LAB — URINALYSIS, ROUTINE W REFLEX MICROSCOPIC
Bilirubin Urine: NEGATIVE
Ketones, ur: NEGATIVE mg/dL
Nitrite: NEGATIVE
Protein, ur: NEGATIVE mg/dL

## 2011-01-10 NOTE — Discharge Summary (Signed)
NAME:  Mario Andrews, Mario Andrews NO.:  [id number]   MEDICAL RECORD NO.:  [id number]           PATIENT TYPE:   LOCATION:                                 FACILITY:   PHYSICIAN:  Michelene Gardener, MD    DATE OF BIRTH:  04-30-1961   DATE OF ADMISSION:  DATE OF DISCHARGE:                               DISCHARGE SUMMARY   DISCHARGE DIAGNOSES:  1. Chest pain, most likely related to cocaine, it is noncardiac.  2. Tobacco abuse.  3. Cocaine abuse.  4. Alcohol intoxication without withdrawal symptoms.   DISCHARGE MEDICATIONS:  1. Multivitamin 1 tablet p.o. once daily.  2. Thiamine 100 mg once a day.  3. Folic acid 1 mg p.o. once daily.   CONSULTATIONS:  None.   PROCEDURES:  None.   Follow up with primary doctor within a week.   COURSE OF HOSPITALIZATION:  This is a 50 year old male who presented to  the hospital complaining of atypical chest pain.  His drug toxicology  came positive for cocaine.  His chest pain is most likely related his  cocaine.  The patient was admitted to the hospital for further  evaluation.  He was placed in telemetry.  Three sets of troponin and  cardiac enzymes were done and they came to be normal.  His telemetry  monitor did not show any evidence of acute arrhythmia.  His pain  actually resolved and he did not have any recurrence during this  hospitalization.  Most likely, his pain is related to his cocaine abuse  and no need for further workup at this time.   The patient also had alcohol intoxication.  He was watched in the  hospital for withdrawal symptoms, but he remains asymptomatic without  withdrawal symptoms.  He started on Ativan protocol, but then it was  discontinued because of lack of symptoms.  I will discharge him on  multivitamin, thiamine, and folic acid and I advised him about quitting  alcohol.  This patient also had positive cocaine which he denied, but  again I advised him about not taking cocaine.  He will be discharged  today  in stable condition.   Total assessment time is 40 minutes.      Michelene Gardener, MD  Electronically Signed    NAE/MEDQ  D:  02/07/2009  T:  02/08/2009  Job:  [id number]

## 2011-01-10 NOTE — H&P (Signed)
NAME:  Mario Andrews, Mario Andrews NO.:  [id number]   MEDICAL RECORD NO.:  [id number]          PATIENT TYPE:  INP   LOCATION:  1439                         FACILITY:  [facility]   PHYSICIAN:  Della Goo, M.D. DATE OF BIRTH:  Mar 22, 1961   DATE OF ADMISSION:  02/05/2009  DATE OF DISCHARGE:                              HISTORY & PHYSICAL   PRIMARY CARE PHYSICIAN:  Unassigned.   CHIEF COMPLAINT:  Chest pain.   HISTORY OF PRESENT ILLNESS:  This is a 50 year old male who presents to  the emergency department with complaints of intermittent chest pain  which has been occurring off and on for the past 24 hours.  The patient  reports having sharp pain in his midchest area associated with shortness  of breath lasting for minutes at a time.  He denies having any nausea,  vomiting, diaphoresis associated with the pain.  The patient reports he  had a similar episodes to this 2 weeks ago.  The patient rated his pain  at the worst as being 8-9 out of 10.  The pain is in the midchest area.  The patient denies having any fevers, chills, cough, congestion.   PAST MEDICAL HISTORY:  Reported history of seizures.  However, the  patient is currently on no medications.   ALLERGIES:  No known medication allergies.  The patient does have a FOOD  ALLERGY TO PEANUTS.   SOCIAL HISTORY:  The patient is a smoker.  He reports smoking 1 pack of  cigarettes daily.  He reports alcohol usage of 2-3 beers daily.  He  denies any illicit drug usage, however, his urine drug screen is  positive for cocaine.   FAMILY HISTORY:  Unable to obtain review of systems.  Pertinents are  mentioned above.   PHYSICAL EXAMINATION FINDINGS:  This is a thin, disheveled 50 year old  Caucasian male in no visible discomfort or acute distress.  His vital  signs are temperature of 97.8, blood pressure 105/69, heart rate 57,  respirations 16, O2 saturation 97% on room air.  HEENT EXAMINATION:  Normocephalic, atraumatic.   Pupils are reactive to  light bilaterally and symmetric.  Extraocular movements are intact.  Funduscopic benign.  There is no scleral icterus.  Nares are patent  bilaterally.  Oropharynx is clear.  NECK:  Supple, full range of motion.  No thyromegaly, adenopathy,  jugular venous distention.  CARDIOVASCULAR:  Regular rate and rhythm.  No murmurs, gallops or rubs.  LUNGS:  Clear to auscultation bilaterally.  ABDOMEN:  Positive bowel sounds, soft, nontender, nondistended.  EXTREMITIES:  Without cyanosis, clubbing or edema.  NEUROLOGIC EXAMINATION:  The patient is somnolent but arousable.  His  cranial nerves are intact.  Speech is clear.  There are no focal  deficits on examination.   LABORATORY STUDIES:  White blood cell count 4.9, hemoglobin 12.3,  hematocrit 35.0, MCV 94.2, platelets 175.  Sodium 138, potassium 4.7,  chloride 105, CO2 22, BUN 8, creatinine 0.8 and glucose 105.  Alcohol  level 249.  Urine drug screen positive for cocaine.   PLAN OF CARE:  Cardiac markers with a  myoglobin of 96.0, CK-MB 1.7,  troponin less than 0.05.  Chest x-ray reveals no acute disease process.  An EKG reveals a normal sinus rhythm without acute ST-segment changes.   ASSESSMENT:  A 50 year old male being admitted with:  1. Atypical chest pain.  2. Tobacco abuse.  3. Cocaine abuse.  4. Alcohol intoxication.   PLAN:  The patient will be admitted to a telemetry area.  Cardiac  enzymes will be performed.  The patient will be placed on the IV Ativan  protocol for alcohol withdrawal.  DVT and GI prophylaxis will also be  ordered.  Further cardiac workup will be decided pending the patient's  clinical course and results of his studies.      Della Goo, M.D.  Electronically Signed     HJ/MEDQ  D:  02/06/2009  T:  02/06/2009  Job:  [id number]

## 2011-01-22 ENCOUNTER — Emergency Department (HOSPITAL_COMMUNITY): Payer: Self-pay

## 2011-01-22 ENCOUNTER — Emergency Department (HOSPITAL_COMMUNITY)
Admission: EM | Admit: 2011-01-22 | Discharge: 2011-01-22 | Disposition: A | Discharge status: Home / Self Care | Payer: Self-pay | Attending: Emergency Medicine | Admitting: Emergency Medicine

## 2011-01-22 DIAGNOSIS — R569 Unspecified convulsions: Secondary | ICD-10-CM | POA: Insufficient documentation

## 2011-01-22 DIAGNOSIS — W010XXA Fall on same level from slipping, tripping and stumbling without subsequent striking against object, initial encounter: Secondary | ICD-10-CM | POA: Insufficient documentation

## 2011-01-22 DIAGNOSIS — S42209A Unspecified fracture of upper end of unspecified humerus, initial encounter for closed fracture: Secondary | ICD-10-CM | POA: Insufficient documentation

## 2011-01-22 DIAGNOSIS — M25519 Pain in unspecified shoulder: Secondary | ICD-10-CM | POA: Insufficient documentation

## 2011-03-18 ENCOUNTER — Emergency Department (HOSPITAL_COMMUNITY)
Admission: EM | Admit: 2011-03-18 | Discharge: 2011-03-18 | Discharge status: Against Medical Advice | Payer: Self-pay | Attending: Emergency Medicine | Admitting: Emergency Medicine

## 2011-03-18 DIAGNOSIS — F1010 Alcohol abuse, uncomplicated: Secondary | ICD-10-CM | POA: Insufficient documentation

## 2011-03-29 ENCOUNTER — Emergency Department (HOSPITAL_COMMUNITY)
Admission: EM | Admit: 2011-03-29 | Discharge: 2011-03-30 | Disposition: A | Discharge status: Home / Self Care | Payer: Self-pay | Attending: Emergency Medicine | Admitting: Emergency Medicine

## 2011-03-29 DIAGNOSIS — X58XXXA Exposure to other specified factors, initial encounter: Secondary | ICD-10-CM | POA: Insufficient documentation

## 2011-03-29 DIAGNOSIS — F1010 Alcohol abuse, uncomplicated: Secondary | ICD-10-CM | POA: Insufficient documentation

## 2011-03-29 DIAGNOSIS — D649 Anemia, unspecified: Secondary | ICD-10-CM | POA: Insufficient documentation

## 2011-03-29 DIAGNOSIS — IMO0002 Reserved for concepts with insufficient information to code with codable children: Secondary | ICD-10-CM | POA: Insufficient documentation

## 2011-03-29 DIAGNOSIS — R748 Abnormal levels of other serum enzymes: Secondary | ICD-10-CM | POA: Insufficient documentation

## 2011-03-29 LAB — CBC
HCT: 35.2 % — ABNORMAL LOW (ref 39.0–52.0)
Hemoglobin: 11.9 g/dL — ABNORMAL LOW (ref 13.0–17.0)
MCHC: 33.8 g/dL (ref 30.0–36.0)
RBC: 4.07 MIL/uL — ABNORMAL LOW (ref 4.22–5.81)
WBC: 5.4 10*3/uL (ref 4.0–10.5)

## 2011-03-29 LAB — COMPREHENSIVE METABOLIC PANEL
ALT: 113 U/L — ABNORMAL HIGH (ref 0–53)
Alkaline Phosphatase: 105 U/L (ref 39–117)
Chloride: 107 mEq/L (ref 96–112)
GFR calc Af Amer: >60 mL/min (ref >60)
Glucose, Bld: 90 mg/dL (ref 70–99)
Potassium: 3.7 mEq/L (ref 3.5–5.1)
Sodium: 141 mEq/L (ref 135–145)
Total Bilirubin: 0.3 mg/dL (ref 0.3–1.2)
Total Protein: 7.2 g/dL (ref 6.0–8.3)

## 2011-03-29 LAB — RAPID URINE DRUG SCREEN, HOSP PERFORMED
Barbiturates: NOT DETECTED
Benzodiazepines: NOT DETECTED
Cocaine: NOT DETECTED
Tetrahydrocannabinol: NOT DETECTED

## 2011-03-29 LAB — DIFFERENTIAL
Basophils Absolute: 0 10*3/uL (ref 0.0–0.1)
Basophils Relative: 1 % (ref 0–1)
Lymphocytes Relative: 53 % — ABNORMAL HIGH (ref 12–46)
Monocytes Absolute: 0.4 10*3/uL (ref 0.1–1.0)
Neutro Abs: 1.8 10*3/uL (ref 1.7–7.7)
Neutrophils Relative %: 33 % — ABNORMAL LOW (ref 43–77)

## 2011-03-29 LAB — GLUCOSE, CAPILLARY: Glucose-Capillary: 96 mg/dL (ref 70–99)

## 2011-04-05 ENCOUNTER — Emergency Department (HOSPITAL_COMMUNITY)
Admission: EM | Admit: 2011-04-05 | Discharge: 2011-04-05 | Disposition: A | Discharge status: Home / Self Care | Payer: Self-pay | Attending: Emergency Medicine | Admitting: Emergency Medicine

## 2011-04-05 DIAGNOSIS — F17200 Nicotine dependence, unspecified, uncomplicated: Secondary | ICD-10-CM | POA: Insufficient documentation

## 2011-04-05 DIAGNOSIS — R21 Rash and other nonspecific skin eruption: Secondary | ICD-10-CM | POA: Insufficient documentation

## 2011-04-05 DIAGNOSIS — F1010 Alcohol abuse, uncomplicated: Secondary | ICD-10-CM | POA: Insufficient documentation

## 2011-05-02 ENCOUNTER — Emergency Department (HOSPITAL_COMMUNITY)
Admission: EM | Admit: 2011-05-02 | Discharge: 2011-05-02 | Disposition: A | Discharge status: Home / Self Care | Payer: Self-pay | Attending: Emergency Medicine | Admitting: Emergency Medicine

## 2011-05-02 DIAGNOSIS — M25519 Pain in unspecified shoulder: Secondary | ICD-10-CM | POA: Insufficient documentation

## 2011-06-09 LAB — ETHANOL: Alcohol, Ethyl (B): 14 — ABNORMAL HIGH

## 2011-06-09 LAB — DIFFERENTIAL
Basophils Absolute: 0
Basophils Relative: 1
Eosinophils Absolute: 0.2
Eosinophils Relative: 3
Lymphocytes Relative: 26
Lymphs Abs: 1.6
Monocytes Absolute: 0.6
Monocytes Relative: 10
Neutro Abs: 3.9
Neutrophils Relative %: 61

## 2011-06-09 LAB — URINALYSIS, ROUTINE W REFLEX MICROSCOPIC
Bilirubin Urine: NEGATIVE
Glucose, UA: NEGATIVE
Hgb urine dipstick: NEGATIVE
Ketones, ur: NEGATIVE
Nitrite: NEGATIVE
Protein, ur: NEGATIVE
Specific Gravity, Urine: 1.005
Urobilinogen, UA: 0.2
pH: 7

## 2011-06-09 LAB — RAPID URINE DRUG SCREEN, HOSP PERFORMED: Benzodiazepines: POSITIVE — AB

## 2011-06-09 LAB — CBC
HCT: 39.3
Hemoglobin: 13.2
MCHC: 33.5
MCV: 91.9
Platelets: 246
RBC: 4.28
RDW: 13.9
WBC: 6.4

## 2011-06-09 LAB — POCT I-STAT CREATININE
Creatinine, Ser: 0.9
Operator id: 285491

## 2011-06-09 LAB — HEPATIC FUNCTION PANEL
AST: 38 — ABNORMAL HIGH
Albumin: 3.3 — ABNORMAL LOW

## 2011-06-09 LAB — I-STAT 8, (EC8 V) (CONVERTED LAB)
Acid-Base Excess: 2
Hemoglobin: 15
Operator id: 285491
Potassium: 4.4
Sodium: 138
TCO2: 30
pH, Ven: 7.352 — ABNORMAL HIGH

## 2011-06-12 LAB — COMPREHENSIVE METABOLIC PANEL
ALT: 67 — ABNORMAL HIGH
CO2: 30
Calcium: 8.8
Chloride: 109
Creatinine, Ser: 0.89
GFR calc non Af Amer: >60
Glucose, Bld: 82
Sodium: 146 — ABNORMAL HIGH
Total Bilirubin: 0.2 — ABNORMAL LOW

## 2011-06-12 LAB — DIFFERENTIAL
Basophils Absolute: 0.1
Eosinophils Absolute: 0.1
Lymphs Abs: 2.9
Neutrophils Relative %: 41 — ABNORMAL LOW

## 2011-06-12 LAB — CBC
Hemoglobin: 12.4 — ABNORMAL LOW
MCHC: 33.7
MCV: 90.1
RBC: 4.08 — ABNORMAL LOW

## 2011-06-12 LAB — RAPID URINE DRUG SCREEN, HOSP PERFORMED
Barbiturates: NOT DETECTED
Benzodiazepines: POSITIVE — AB

## 2011-06-12 LAB — LIPASE, BLOOD: Lipase: 22

## 2011-06-12 LAB — ACETAMINOPHEN LEVEL: Acetaminophen (Tylenol), Serum: <10 — ABNORMAL LOW

## 2011-06-12 LAB — SALICYLATE LEVEL: Salicylate Lvl: <4

## 2011-08-03 ENCOUNTER — Emergency Department (HOSPITAL_COMMUNITY)
Admission: EM | Admit: 2011-08-03 | Discharge: 2011-08-03 | Disposition: A | Discharge status: Home / Self Care | Payer: Self-pay | Attending: Emergency Medicine | Admitting: Emergency Medicine

## 2011-08-03 ENCOUNTER — Encounter: Payer: Self-pay | Admitting: Emergency Medicine

## 2011-08-03 ENCOUNTER — Emergency Department (HOSPITAL_COMMUNITY): Payer: Self-pay

## 2011-08-03 ENCOUNTER — Other Ambulatory Visit: Payer: Self-pay

## 2011-08-03 DIAGNOSIS — F17200 Nicotine dependence, unspecified, uncomplicated: Secondary | ICD-10-CM | POA: Insufficient documentation

## 2011-08-03 DIAGNOSIS — F1010 Alcohol abuse, uncomplicated: Secondary | ICD-10-CM | POA: Insufficient documentation

## 2011-08-03 DIAGNOSIS — R0602 Shortness of breath: Secondary | ICD-10-CM | POA: Insufficient documentation

## 2011-08-03 DIAGNOSIS — R079 Chest pain, unspecified: Secondary | ICD-10-CM | POA: Insufficient documentation

## 2011-08-03 DIAGNOSIS — F10929 Alcohol use, unspecified with intoxication, unspecified: Secondary | ICD-10-CM

## 2011-08-03 HISTORY — DX: Anxiety disorder, unspecified: F41.9

## 2011-08-03 LAB — COMPREHENSIVE METABOLIC PANEL WITH GFR
ALT: 91 U/L — ABNORMAL HIGH (ref 0–53)
AST: 123 U/L — ABNORMAL HIGH (ref 0–37)
Albumin: 3.7 g/dL (ref 3.5–5.2)
Alkaline Phosphatase: 121 U/L — ABNORMAL HIGH (ref 39–117)
BUN: 4 mg/dL — ABNORMAL LOW (ref 6–23)
CO2: 24 meq/L (ref 19–32)
Calcium: 9.1 mg/dL (ref 8.4–10.5)
Chloride: 107 meq/L (ref 96–112)
Creatinine, Ser: 0.65 mg/dL (ref 0.50–1.35)
GFR calc Af Amer: >90 mL/min
GFR calc non Af Amer: >90 mL/min
Glucose, Bld: 90 mg/dL (ref 70–99)
Potassium: 3.5 meq/L (ref 3.5–5.1)
Sodium: 141 meq/L (ref 135–145)
Total Bilirubin: 0.3 mg/dL (ref 0.3–1.2)
Total Protein: 7.3 g/dL (ref 6.0–8.3)

## 2011-08-03 LAB — CARDIAC PANEL(CRET KIN+CKTOT+MB+TROPI)
CK, MB: 3.4 ng/mL (ref 0.3–4.0)
Relative Index: 2.1 (ref 0.0–2.5)
Total CK: 160 U/L (ref 7–232)
Troponin I: <0.3 ng/mL

## 2011-08-03 LAB — LIPASE, BLOOD: Lipase: 22 U/L (ref 11–59)

## 2011-08-03 MED ORDER — FAMOTIDINE IN NACL 20-0.9 MG/50ML-% IV SOLN
20.0000 mg | Freq: Once | INTRAVENOUS | Status: AC
  Administered 2011-08-03: 20 mg via INTRAVENOUS
  Filled 2011-08-03: qty 50

## 2011-08-03 MED ORDER — ONDANSETRON HCL 4 MG/2ML IJ SOLN
4.0000 mg | Freq: Once | INTRAMUSCULAR | Status: AC
  Administered 2011-08-03: 4 mg via INTRAVENOUS
  Filled 2011-08-03: qty 2

## 2011-08-03 MED ORDER — SODIUM CHLORIDE 0.9 % IV BOLUS (SEPSIS)
1000.0000 mL | Freq: Once | INTRAVENOUS | Status: AC
  Administered 2011-08-03: 1000 mL via INTRAVENOUS

## 2011-08-03 MED ORDER — THIAMINE HCL 100 MG/ML IJ SOLN
100.0000 mg | Freq: Once | INTRAMUSCULAR | Status: AC
  Administered 2011-08-03: 100 mg via INTRAVENOUS
  Filled 2011-08-03: qty 2

## 2011-08-03 MED ORDER — FAMOTIDINE 20 MG PO TABS: 20.0000 mg | ORAL_TABLET | Freq: Two times a day (BID) | ORAL | Status: DC

## 2011-08-03 NOTE — ED Provider Notes (Addendum)
History     CSN: [id number] Arrival date & time: 08/03/2011  4:59 PM   First MD Initiated Contact with Patient 08/03/11 1704      Chief Complaint  Patient presents with  . Chest Pain    described as sob when he starts to fall asleep  . Alcohol Intoxication   patient with a known history of chronic alcohol abuse and COPD. He reports intermittent chest pain and shortness of breath for "3-4 years. He was also requesting something, "for my nerves". Patient appears to be intoxicated and smells of alcohol. He's had no fever or cough. He denies any abdominal pain. He denies any nausea or vomiting. At this point in time. He states it is not her primary doctor. Patient denies any previous stress testing in the past  (Consider location/radiation/quality/duration/timing/severity/associated sxs/prior treatment) HPI  Past Medical History  Diagnosis Date  . Anxiety     History reviewed. No pertinent past surgical history.  No family history on file.  History  Substance Use Topics  . Smoking status: Current Everyday Smoker -- 0.5 packs/day    Types: Cigarettes  . Smokeless tobacco: Not on file  . Alcohol Use: Yes     1 beer today      Review of Systems  Unable to perform ROS: Other    Allergies  Review of patient's allergies indicates no known allergies.  Home Medications   Current Outpatient Rx  Name Route Sig Dispense Refill  . TRAZODONE HCL 50 MG PO TABS Oral Take 50 mg by mouth at bedtime.      Marland Kitchen FAMOTIDINE 20 MG PO TABS Oral Take 1 tablet (20 mg total) by mouth 2 (two) times daily. 30 tablet 0    BP 98/58  Pulse 75  Temp(Src) 97.7 F (36.5 C) (Oral)  Resp 16  Ht 6\' 1"  (1.854 m)  Wt 157 lb (71.215 kg)  BMI 20.71 kg/m2  SpO2 95%  Physical Exam  Nursing note and vitals reviewed. Constitutional: He is oriented to person, place, and time. He appears well-developed and well-nourished.       Somewhat disheveled, spells of alcohol, appears to be intoxicated  HENT:    Head: Normocephalic and atraumatic.  Eyes: Conjunctivae and EOM are normal. Pupils are equal, round, and reactive to light.  Neck: Neck supple.  Cardiovascular: Normal rate and regular rhythm.  Exam reveals no gallop and no friction rub.   No murmur heard. Pulmonary/Chest: Breath sounds normal. He has no wheezes. He has no rales. He exhibits no tenderness.  Abdominal: Soft. Bowel sounds are normal. He exhibits no distension. There is no tenderness. There is no rebound and no guarding.  Musculoskeletal: Normal range of motion.  Neurological: He is alert and oriented to person, place, and time. No cranial nerve deficit. Coordination normal.  Skin: Skin is warm and dry. No rash noted.  Psychiatric: He has a normal mood and affect.    ED Course  Procedures (including critical care time)  Labs Reviewed  COMPREHENSIVE METABOLIC PANEL - Abnormal; Notable for the following:    BUN 4 (*)    AST 123 (*)    ALT 91 (*)    Alkaline Phosphatase 121 (*)    All other components within normal limits  ETHANOL - Abnormal; Notable for the following:    Alcohol, Ethyl (B) 254 (*)    All other components within normal limits  CARDIAC PANEL(CRET KIN+CKTOT+MB+TROPI)  LIPASE, BLOOD  POCT I-STAT TROPONIN I  DRUGS OF ABUSE SCREEN W  ALC, ROUTINE URINE  I-STAT TROPONIN I   Dg Chest 2 View  08/03/2011  *RADIOLOGY REPORT*  Clinical Data: Chest pain with cough and shortness of breath.  CHEST - 2 VIEW  Comparison: 08/26/2010.  Findings: COPD is present with hyperinflation.  Normal heart size. No infiltrates or failure.  No effusion or pneumothorax. Bones unremarkable. Improved aeration compared with priors with clearing of left base atelectasis.  IMPRESSION: COPD, no active disease.  Original Report Authenticated By: Elsie Stain, M.D.     1. Alcohol intoxication   2. Alcohol abuse   3. Chest pain       MDM  Pt is seen and examined;  Initial history and physical completed.  Will follow.         Results for orders placed during the hospital encounter of 08/03/11  CARDIAC PANEL(CRET KIN+CKTOT+MB+TROPI)      Component Value Range   Total CK 160  7 - 232 (U/L)   CK, MB 3.4  0.3 - 4.0 (ng/mL)   Troponin I <0.30  <0.30 (ng/mL)   Relative Index 2.1  0.0 - 2.5   COMPREHENSIVE METABOLIC PANEL      Component Value Range   Sodium 141  135 - 145 (mEq/L)   Potassium 3.5  3.5 - 5.1 (mEq/L)   Chloride 107  96 - 112 (mEq/L)   CO2 24  19 - 32 (mEq/L)   Glucose, Bld 90  70 - 99 (mg/dL)   BUN 4 (*) 6 - 23 (mg/dL)   Creatinine, Ser 1.61  0.50 - 1.35 (mg/dL)   Calcium 9.1  8.4 - 09.6 (mg/dL)   Total Protein 7.3  6.0 - 8.3 (g/dL)   Albumin 3.7  3.5 - 5.2 (g/dL)   AST 045 (*) 0 - 37 (U/L)   ALT 91 (*) 0 - 53 (U/L)   Alkaline Phosphatase 121 (*) 39 - 117 (U/L)   Total Bilirubin 0.3  0.3 - 1.2 (mg/dL)   GFR calc non Af Amer >90  >90 (mL/min)   GFR calc Af Amer >90  >90 (mL/min)  LIPASE, BLOOD      Component Value Range   Lipase 22  11 - 59 (U/L)  ETHANOL      Component Value Range   Alcohol, Ethyl (B) 254 (*) 0 - 11 (mg/dL)  POCT I-STAT TROPONIN I      Component Value Range   Troponin i, poc 0.00  0.00 - 0.08 (ng/mL)   Comment 3            No results found.  Date: 08/03/2011  Rate: 81  Rhythm: normal sinus rhythm  QRS Axis: normal  Intervals: normal  ST/T Wave abnormalities: nonspecific ST changes and diffuse ST elev, possible pericarditis, but unchanged from previ.   Conduction Disutrbances:none  Narrative Interpretation:   Old EKG Reviewed: unchanged   9:40 PM Patient is reassessed. He is fast asleep, no acute distress. Denies any chest pain. Will check a three-hour cardiac marker and likely discharged home for outpatient followup.   9:41 PM Patient remained stable. He has essentially been chest pain-free. The entire visit here. Doubt cardiac etiology for his symptoms, which have really been chronic over months to years. He is discharged home and given. Followup  instructions. May need outpatient studies, including stress test or EGD. Patient was encouraged to return to ED for any concerns. The patient was told to avoid heavy drinking.  He is discharged home in stable and improved condition  Venesha Petraitis A. Patrica Duel, MD 08/03/11 2141  Lorelle Gibbs. Patrica Duel, MD 08/03/11 2142

## 2011-08-03 NOTE — ED Notes (Signed)
MD at bedside. 

## 2011-08-03 NOTE — ED Notes (Signed)
Vital signs stable. 

## 2011-08-03 NOTE — ED Notes (Signed)
Chest pain every night when pt tries to go to sleep at night for the last 3-4 years. "I just need something for my nerves."  -- "I have a hole in my heart."

## 2011-08-03 NOTE — ED Notes (Signed)
Patient transported to X-ray 

## 2011-08-03 NOTE — ED Notes (Signed)
Patient is resting comfortably. 

## 2011-08-03 NOTE — ED Notes (Signed)
Rx sent to Pharm

## 2011-08-28 ENCOUNTER — Encounter (HOSPITAL_COMMUNITY): Payer: Self-pay

## 2011-08-28 ENCOUNTER — Emergency Department (HOSPITAL_COMMUNITY)
Admission: EM | Admit: 2011-08-28 | Discharge: 2011-08-29 | Disposition: A | Discharge status: Home / Self Care | Payer: Self-pay | Attending: Emergency Medicine | Admitting: Emergency Medicine

## 2011-08-28 DIAGNOSIS — Z79899 Other long term (current) drug therapy: Secondary | ICD-10-CM | POA: Insufficient documentation

## 2011-08-28 DIAGNOSIS — F17200 Nicotine dependence, unspecified, uncomplicated: Secondary | ICD-10-CM | POA: Insufficient documentation

## 2011-08-28 DIAGNOSIS — F411 Generalized anxiety disorder: Secondary | ICD-10-CM | POA: Insufficient documentation

## 2011-08-28 DIAGNOSIS — K029 Dental caries, unspecified: Secondary | ICD-10-CM | POA: Insufficient documentation

## 2011-08-28 DIAGNOSIS — S0990XA Unspecified injury of head, initial encounter: Secondary | ICD-10-CM | POA: Insufficient documentation

## 2011-08-28 DIAGNOSIS — R51 Headache: Secondary | ICD-10-CM | POA: Insufficient documentation
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
## 2011-08-29 MED ORDER — IBUPROFEN 200 MG PO TABS
400.0000 mg | ORAL_TABLET | Freq: Once | ORAL | Status: AC
  Administered 2011-08-29: 400 mg via ORAL
  Filled 2011-08-29: qty 2

## 2011-08-29 NOTE — ED Notes (Signed)
MD at bedside. 
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
## 2011-08-29 NOTE — ED Notes (Signed)
Informing pa that the patient doesn't have a ride home.

## 2011-08-29 NOTE — ED Notes (Signed)
Patient is talking with family at this time on phone

## 2011-08-29 NOTE — ED Notes (Signed)
Patient stable and discharged to waiting room per md order.  Patient given bus pass for when it starts up running

## 2011-08-29 NOTE — ED Provider Notes (Signed)
Medical screening examination/treatment/procedure(s) were performed by non-physician practitioner and as supervising physician I was immediately available for consultation/collaboration.   Joya Gaskins, MD 08/29/11 [phone]

## 2011-11-06 ENCOUNTER — Other Ambulatory Visit: Payer: Self-pay

## 2011-11-06 ENCOUNTER — Emergency Department (HOSPITAL_COMMUNITY): Payer: Medicaid Other

## 2011-11-06 ENCOUNTER — Emergency Department (HOSPITAL_COMMUNITY)
Admission: EM | Admit: 2011-11-06 | Discharge: 2011-11-06 | Disposition: A | Discharge status: Home / Self Care | Payer: Medicaid Other | Attending: Emergency Medicine | Admitting: Emergency Medicine

## 2011-11-06 DIAGNOSIS — R296 Repeated falls: Secondary | ICD-10-CM | POA: Insufficient documentation

## 2011-11-06 DIAGNOSIS — F10929 Alcohol use, unspecified with intoxication, unspecified: Secondary | ICD-10-CM

## 2011-11-06 DIAGNOSIS — F1010 Alcohol abuse, uncomplicated: Secondary | ICD-10-CM | POA: Insufficient documentation

## 2011-11-06 DIAGNOSIS — Y92009 Unspecified place in unspecified non-institutional (private) residence as the place of occurrence of the external cause: Secondary | ICD-10-CM | POA: Insufficient documentation

## 2011-11-06 DIAGNOSIS — R55 Syncope and collapse: Secondary | ICD-10-CM | POA: Insufficient documentation

## 2011-11-06 DIAGNOSIS — R220 Localized swelling, mass and lump, head: Secondary | ICD-10-CM | POA: Insufficient documentation

## 2011-11-06 DIAGNOSIS — M5030 Other cervical disc degeneration, unspecified cervical region: Secondary | ICD-10-CM | POA: Insufficient documentation

## 2011-11-06 DIAGNOSIS — S022XXA Fracture of nasal bones, initial encounter for closed fracture: Secondary | ICD-10-CM | POA: Insufficient documentation

## 2011-11-06 LAB — URINE MICROSCOPIC-ADD ON

## 2011-11-06 LAB — BASIC METABOLIC PANEL
CO2: 24 mEq/L (ref 19–32)
Chloride: 102 mEq/L (ref 96–112)
GFR calc Af Amer: >90 mL/min (ref >90)
Potassium: 3.9 mEq/L (ref 3.5–5.1)
Sodium: 138 mEq/L (ref 135–145)

## 2011-11-06 LAB — DIFFERENTIAL
Basophils Absolute: 0 10*3/uL (ref 0.0–0.1)
Basophils Relative: 1 % (ref 0–1)
Lymphocytes Relative: 45 % (ref 12–46)
Neutro Abs: 2.7 10*3/uL (ref 1.7–7.7)
Neutrophils Relative %: 45 % (ref 43–77)

## 2011-11-06 LAB — CBC
HCT: 39.6 % (ref 39.0–52.0)
Platelets: 161 10*3/uL (ref 150–400)
RDW: 13.9 % (ref 11.5–15.5)
WBC: 5.9 10*3/uL (ref 4.0–10.5)

## 2011-11-06 LAB — URINALYSIS, ROUTINE W REFLEX MICROSCOPIC
Glucose, UA: NEGATIVE mg/dL
Leukocytes, UA: NEGATIVE
Nitrite: NEGATIVE
Protein, ur: NEGATIVE mg/dL
pH: 6 (ref 5.0–8.0)

## 2011-11-06 LAB — RAPID URINE DRUG SCREEN, HOSP PERFORMED
Barbiturates: NOT DETECTED
Benzodiazepines: NOT DETECTED
Cocaine: POSITIVE — AB

## 2011-11-06 LAB — ETHANOL: Alcohol, Ethyl (B): 231 mg/dL — ABNORMAL HIGH (ref 0–11)

## 2011-11-06 NOTE — Discharge Instructions (Signed)
Stop drinking alcohol. See the Dr. your choice about your nose fracture, if needed.  Alcohol Intoxication You have alcohol intoxication when the amount of alcohol that you have consumed has impaired your ability to mentally and physically function. There are a variety of factors that contribute to the level at which alcohol intoxication can occur, such as age, gender, weight, frequency of alcohol consumption, medication use, and the presence of other medical conditions, such as diabetes, seizures, or heart conditions. The blood alcohol level test measures the concentration of alcohol in your blood. In most states, your blood alcohol level must be lower than 80 mg/dL (9.60%) to legally drive. However, many dangerous effects of alcohol can occur at much lower levels. Alcohol directly impairs the normal chemical activity of the brain and is said to be a chemical depressant. Alcohol can cause drowsiness, stupor, respiratory failure, and coma. Other physical effects can include headache, vomiting, vomiting of blood, abdominal pain, a fast heartbeat, difficulty breathing, anxiety, and amnesia. Alcohol intoxication can also lead to dangerous and life-threatening activities, such as fighting, dangerous operation of vehicles or heavy machinery, and risky sexual behavior. Alcohol can be especially dangerous when taken with other drugs. Some of these drugs are:  Sedatives.   Painkillers.   Marijuana.   Tranquilizers.   Antihistamines.   Muscle relaxants.   Seizure medicine.  Many of the effects of acute alcohol intoxication are temporary. However, repeated alcohol intoxication can lead to severe medical illnesses. If you have alcohol intoxication, you should:  Stay hydrated. Drink enough water and fluids to keep your urine clear or pale yellow. Avoid excessive caffeine because this can further lead to dehydration.   Eat a healthy diet. You may have residual nausea, headache, and loss of appetite, but  it is still important that you maintain good nutrition. You can start with clear liquids.   Take nonsteroidal anti-inflammatory medications as needed for headaches, but make sure to do so with small meals. You should avoid acetaminophen for several days after having alcohol intoxication because the combination of alcohol and acetaminophen can be toxic to your liver.  If you have frequent alcohol intoxication, ask your friends and family if they think you have a drinking problem. For further help, contact:  Your caregiver.   Alcoholics Anonymous (AA).   A drug or alcohol rehabilitation program.  SEEK MEDICAL CARE IF:   You have persistent vomiting.   You have persistent pain in any part of your body.   You do not feel better after a few days.  SEEK IMMEDIATE MEDICAL CARE IF:   You become shaky or tremble when you try to stop drinking.   You shake uncontrollably (seizure).   You throw up (vomit) blood. This may be bright red or it may look like black coffee grounds.   You have blood in the stool. This may be bright red or appear as a black, tarry, bad smelling stool.   You become lightheaded or faint.  ANY OF THESE SYMPTOMS MAY REPRESENT A SERIOUS PROBLEM THAT IS AN EMERGENCY. Do not wait to see if the symptoms will go away. Get medical help right away. Call your local emergency services (911 in [place] DO NOT drive yourself to the hospital. MAKE SURE YOU:   Understand these instructions.   Will watch your condition.   Will get help right away if you are not doing well or get worse.  Document Released: 05/24/2005 Document Revised: 08/03/2011 Document Reviewed: 01/31/2010 [facility] Patient Information 2012 [place], [place].Nasal  Fracture A fracture is a break in the bone. A nasal fracture is a broken nose. Minor breaks do not need treatment. Serious breaks may need surgery.  HOME CARE  Put ice on the injured area.   Put ice in a plastic bag.   Place a towel between your skin  and the bag.   Leave the ice on for 15 to 20 minutes, 3 to 4 times a day.   Only take medicine as told by your doctor.   If your nose bleeds, squeeze your nose shut gently. Sit upright for 10 minutes.   Do not play contact sports for 3 to 4 weeks or as told by your doctor.  GET HELP RIGHT AWAY IF:   You have more pain or severe pain.   You keep having nosebleeds.   The shape of your nose does not return to normal after 5 days.   You have yellowish white fluid (pus) coming from your nose.   Your nose bleeds for over 20 minutes.   Clear fluid drains from your nose.   You have a grape-like puffiness (swelling) on the inside of your nose.   You have trouble moving your eyes.   You keep throwing up (vomiting).  MAKE SURE YOU:   Understand these instructions.   Will watch this condition.   Will get help right away if you are not doing well or get worse.  Document Released: 05/23/2008 Document Revised: 08/03/2011 Document Reviewed: 11/28/2010 [facility] Patient Information 2012 [place], [place].

## 2011-11-06 NOTE — ED Notes (Signed)
Spoke with sister-in-law Amen Staszak at [phone] aware pt has been discharged and is ready to go home

## 2011-11-06 NOTE — ED Provider Notes (Signed)
History     CSN: [id number]  Arrival date & time 11/06/11  1700   First MD Initiated Contact with Patient 11/06/11 1727      Chief Complaint  Patient presents with  . Fall  . Alcohol Intoxication    (Consider location/radiation/quality/duration/timing/severity/associated sxs/prior treatment) HPI Comments: Mario Andrews is a 51 y.o. male who states he was walking into his kitchen when he "blacked out.". He fell forward, striking his face. He admits to drinking alcohol this morning. He was transferred here by EMS under full spinal immobilization. He has not taken anything at home for the problem, nor was treated by EMS. He complains of pain only in his face. He denies recent illness. Patient is a frequent visitor to the emergency department for difficulties with alcohol intoxication.  Patient is a 51 y.o. male presenting with fall and intoxication. The history is provided by the patient.  Fall  Alcohol Intoxication    Past Medical History  Diagnosis Date  . Anxiety     No past surgical history on file.  No family history on file.  History  Substance Use Topics  . Smoking status: Current Everyday Smoker -- 0.5 packs/day    Types: Cigarettes  . Smokeless tobacco: Not on file  . Alcohol Use: Yes     1 beer today      Review of Systems  All other systems reviewed and are negative.    Allergies  Peanut-containing drug products  Home Medications   Current Outpatient Rx  Name Route Sig Dispense Refill  . TRAZODONE HCL 50 MG PO TABS Oral Take 100 mg by mouth at bedtime.       BP 90/56  Pulse 56  Temp(Src) 98.2 F (36.8 C) (Oral)  Resp 16  SpO2 91%  Physical Exam  Nursing note and vitals reviewed. Constitutional: He is oriented to person, place, and time. He appears well-developed and well-nourished.  HENT:  Head: Normocephalic.  Right Ear: External ear normal.  Left Ear: External ear normal.       This chart, but on the nares, bilaterally, and in the  mouth. The nose is swollen and tender. There is no midface crepitation or deformity.  Eyes: Conjunctivae and EOM are normal. Pupils are equal, round, and reactive to light. Right eye exhibits no discharge. Left eye exhibits no discharge.  Neck: Normal range of motion and phonation normal. Neck supple.  Cardiovascular: Normal rate, regular rhythm, normal heart sounds and intact distal pulses.   Pulmonary/Chest: Effort normal and breath sounds normal. He exhibits no bony tenderness.  Abdominal: Soft. Normal appearance. There is no tenderness.  Musculoskeletal: Normal range of motion.  Neurological: He is alert and oriented to person, place, and time. He has normal strength. No cranial nerve deficit or sensory deficit. He exhibits normal muscle tone. Coordination normal.  Skin: Skin is warm, dry and intact.  Psychiatric: He has a normal mood and affect. His behavior is normal. Judgment and thought content normal.    ED Course  Procedures (including critical care time)  Labs Reviewed  BASIC METABOLIC PANEL - Abnormal; Notable for the following:    Glucose, Bld 102 (*)    All other components within normal limits  URINALYSIS, ROUTINE W REFLEX MICROSCOPIC - Abnormal; Notable for the following:    Hgb urine dipstick LARGE (*)    All other components within normal limits  ETHANOL - Abnormal; Notable for the following:    Alcohol, Ethyl (B) 231 (*)    All  other components within normal limits  URINE RAPID DRUG SCREEN (HOSP PERFORMED) - Abnormal; Notable for the following:    Cocaine POSITIVE (*)    All other components within normal limits  CBC  DIFFERENTIAL  URINE MICROSCOPIC-ADD ON  LAB REPORT - SCANNED   Ct Head Wo Contrast  11/06/2011  *RADIOLOGY REPORT*  Clinical Data:  Fall, trauma, alcohol desiccation  CT HEAD WITHOUT CONTRAST CT MAXILLOFACIAL WITHOUT CONTRAST CT CERVICAL SPINE WITHOUT CONTRAST  Technique:  Multidetector CT imaging of the head, cervical spine, and maxillofacial  structures were performed using the standard protocol without intravenous contrast. Multiplanar CT image reconstructions of the cervical spine and maxillofacial structures were also generated.  Comparison:  03/26/2010  CT HEAD  Findings: No acute intracranial hemorrhage, definite infarction, mass lesion, midline shift, herniation, hydrocephalus, or extra- axial fluid collection.  Normal gray-white matter differentiation. No focal edema.  Cisterns patent.  No cerebellar abnormality. Symmetric orbits.  Mastoids clear.  Fractures  of the nasal bones and anterior aspect of the nasal septum noted.  Mucosal thickening and maxillary sinus air fluid levels could represent blood the sinuses.  IMPRESSION: No acute intracranial process  Nasal bone and anterior nasal septum fractures  Maxillary sinus air fluid levels, suspect dependent blood products.  CT MAXILLOFACIAL  Findings:  Acute minimally-displaced fractures noted of the nasal bones and anterior nasal septum, slightly worse on the left.  Other facial bones appear intact.  Specifically, the mandible, skull base, zygomas, pterygoid plates, hard palate, maxillary walls, and orbits appear intact.  No orbital blowout fracture or orbital asymmetry.  No proptosis.  Poor dentition noted.  Chronic mucosal thickening in the maxillary sinuses.  Maxillary sinus small air fluid levels symmetrically, suspect dependent blood products from the facial fractures.  IMPRESSION: Acute minimally-displaced fractures of the nasal bones and anterior aspect of the nasal septum, slightly worse on the left.  Sinus opacification and fluid levels, suspect blood products from the acute facial trauma  Orbits appear intact.  CT CERVICAL SPINE  Findings:   Mild diffuse degenerative changes and spondylosis.  No compression fracture, wedge shaped deformity or focal kyphosis.  No acute osseous abnormality.  Intact odontoid.  Normal prevertebral soft tissues.  Facets aligned.  Foramina patent.  Lung apices  demonstrate small subpleural apical blebs.  IMPRESSION: Degenerative changes.  No acute fracture demonstrated.  Stable exam.  Original Report Authenticated By: Judie Petit. Ruel Favors, M.D.   Ct Cervical Spine Wo Contrast  11/06/2011  *RADIOLOGY REPORT*  Clinical Data:  Fall, trauma, alcohol desiccation  CT HEAD WITHOUT CONTRAST CT MAXILLOFACIAL WITHOUT CONTRAST CT CERVICAL SPINE WITHOUT CONTRAST  Technique:  Multidetector CT imaging of the head, cervical spine, and maxillofacial structures were performed using the standard protocol without intravenous contrast. Multiplanar CT image reconstructions of the cervical spine and maxillofacial structures were also generated.  Comparison:  03/26/2010  CT HEAD  Findings: No acute intracranial hemorrhage, definite infarction, mass lesion, midline shift, herniation, hydrocephalus, or extra- axial fluid collection.  Normal gray-white matter differentiation. No focal edema.  Cisterns patent.  No cerebellar abnormality. Symmetric orbits.  Mastoids clear.  Fractures  of the nasal bones and anterior aspect of the nasal septum noted.  Mucosal thickening and maxillary sinus air fluid levels could represent blood the sinuses.  IMPRESSION: No acute intracranial process  Nasal bone and anterior nasal septum fractures  Maxillary sinus air fluid levels, suspect dependent blood products.  CT MAXILLOFACIAL  Findings:  Acute minimally-displaced fractures noted of the nasal bones and anterior nasal  septum, slightly worse on the left.  Other facial bones appear intact.  Specifically, the mandible, skull base, zygomas, pterygoid plates, hard palate, maxillary walls, and orbits appear intact.  No orbital blowout fracture or orbital asymmetry.  No proptosis.  Poor dentition noted.  Chronic mucosal thickening in the maxillary sinuses.  Maxillary sinus small air fluid levels symmetrically, suspect dependent blood products from the facial fractures.  IMPRESSION: Acute minimally-displaced fractures of  the nasal bones and anterior aspect of the nasal septum, slightly worse on the left.  Sinus opacification and fluid levels, suspect blood products from the acute facial trauma  Orbits appear intact.  CT CERVICAL SPINE  Findings:   Mild diffuse degenerative changes and spondylosis.  No compression fracture, wedge shaped deformity or focal kyphosis.  No acute osseous abnormality.  Intact odontoid.  Normal prevertebral soft tissues.  Facets aligned.  Foramina patent.  Lung apices demonstrate small subpleural apical blebs.  IMPRESSION: Degenerative changes.  No acute fracture demonstrated.  Stable exam.  Original Report Authenticated By: Judie Petit. Ruel Favors, M.D.   Ct Maxillofacial Wo Cm  11/06/2011  *RADIOLOGY REPORT*  Clinical Data:  Fall, trauma, alcohol desiccation  CT HEAD WITHOUT CONTRAST CT MAXILLOFACIAL WITHOUT CONTRAST CT CERVICAL SPINE WITHOUT CONTRAST  Technique:  Multidetector CT imaging of the head, cervical spine, and maxillofacial structures were performed using the standard protocol without intravenous contrast. Multiplanar CT image reconstructions of the cervical spine and maxillofacial structures were also generated.  Comparison:  03/26/2010  CT HEAD  Findings: No acute intracranial hemorrhage, definite infarction, mass lesion, midline shift, herniation, hydrocephalus, or extra- axial fluid collection.  Normal gray-white matter differentiation. No focal edema.  Cisterns patent.  No cerebellar abnormality. Symmetric orbits.  Mastoids clear.  Fractures  of the nasal bones and anterior aspect of the nasal septum noted.  Mucosal thickening and maxillary sinus air fluid levels could represent blood the sinuses.  IMPRESSION: No acute intracranial process  Nasal bone and anterior nasal septum fractures  Maxillary sinus air fluid levels, suspect dependent blood products.  CT MAXILLOFACIAL  Findings:  Acute minimally-displaced fractures noted of the nasal bones and anterior nasal septum, slightly worse on the  left.  Other facial bones appear intact.  Specifically, the mandible, skull base, zygomas, pterygoid plates, hard palate, maxillary walls, and orbits appear intact.  No orbital blowout fracture or orbital asymmetry.  No proptosis.  Poor dentition noted.  Chronic mucosal thickening in the maxillary sinuses.  Maxillary sinus small air fluid levels symmetrically, suspect dependent blood products from the facial fractures.  IMPRESSION: Acute minimally-displaced fractures of the nasal bones and anterior aspect of the nasal septum, slightly worse on the left.  Sinus opacification and fluid levels, suspect blood products from the acute facial trauma  Orbits appear intact.  CT CERVICAL SPINE  Findings:   Mild diffuse degenerative changes and spondylosis.  No compression fracture, wedge shaped deformity or focal kyphosis.  No acute osseous abnormality.  Intact odontoid.  Normal prevertebral soft tissues.  Facets aligned.  Foramina patent.  Lung apices demonstrate small subpleural apical blebs.  IMPRESSION: Degenerative changes.  No acute fracture demonstrated.  Stable exam.  Original Report Authenticated By: Judie Petit. Ruel Favors, M.D.     1. Nasal fracture   2. Alcohol intoxication   3. Syncope       MDM  Syncope with EtOH intoxication. Doubt cardiac arrhythymia, CVA, ACS, PE.    Plan: Home Medications- none; Home Treatments- stop EtOH; Recommended follow up- PCP of choice prn  Flint Melter, MD 11/07/11 1306

## 2011-11-06 NOTE — ED Notes (Signed)
Pt has been drinking some alcohol today. Ambulated to kitchen, fell forward and hit nose on counter. Bruising and swelling to nose. Pt reports loc. + headache left forehead.

## 2011-11-06 NOTE — ED Notes (Signed)
Pt 's left nostril bleeding slightly. Cleaned nostril and applied gauze to it.

## 2011-11-06 NOTE — ED Notes (Signed)
Antwion Carpenter- sister in law [phone]

## 2011-11-08 NOTE — Progress Notes (Signed)
On 11/06/11 Pt listed as self pay with no insurance coverage Pt confirms he is self pay [place] resident CM and [facility] coordinator spoke with him Pt offered [facility] services to assist with finding a [place] self pay provider Pt accepted information    

## 2012-02-12 ENCOUNTER — Emergency Department (HOSPITAL_COMMUNITY)
Admission: EM | Admit: 2012-02-12 | Discharge: 2012-02-13 | Disposition: A | Discharge status: Home / Self Care | Payer: Medicaid Other | Attending: Emergency Medicine | Admitting: Emergency Medicine

## 2012-02-12 ENCOUNTER — Encounter (HOSPITAL_COMMUNITY): Payer: Self-pay | Admitting: *Deleted

## 2012-02-12 DIAGNOSIS — G40909 Epilepsy, unspecified, not intractable, without status epilepticus: Secondary | ICD-10-CM | POA: Insufficient documentation

## 2012-02-12 DIAGNOSIS — F17200 Nicotine dependence, unspecified, uncomplicated: Secondary | ICD-10-CM | POA: Insufficient documentation

## 2012-02-12 DIAGNOSIS — F1010 Alcohol abuse, uncomplicated: Secondary | ICD-10-CM | POA: Insufficient documentation

## 2012-02-12 DIAGNOSIS — F411 Generalized anxiety disorder: Secondary | ICD-10-CM | POA: Insufficient documentation

## 2012-02-12 HISTORY — DX: Major depressive disorder, single episode, unspecified: F32.9

## 2012-02-12 HISTORY — DX: Unspecified convulsions: R56.9

## 2012-02-12 HISTORY — DX: Depression, unspecified: F32.A

## 2012-02-12 LAB — CBC
HCT: 39.4 % (ref 39.0–52.0)
MCHC: 35.3 g/dL (ref 30.0–36.0)
RDW: 14.7 % (ref 11.5–15.5)
WBC: 7.9 10*3/uL (ref 4.0–10.5)

## 2012-02-12 LAB — DIFFERENTIAL
Basophils Absolute: 0 10*3/uL (ref 0.0–0.1)
Basophils Relative: 0 % (ref 0–1)
Lymphocytes Relative: 55 % — ABNORMAL HIGH (ref 12–46)
Monocytes Absolute: 0.5 10*3/uL (ref 0.1–1.0)
Neutro Abs: 2.8 10*3/uL (ref 1.7–7.7)
Neutrophils Relative %: 36 % — ABNORMAL LOW (ref 43–77)

## 2012-02-12 LAB — RAPID URINE DRUG SCREEN, HOSP PERFORMED
Amphetamines: NOT DETECTED
Benzodiazepines: NOT DETECTED
Cocaine: POSITIVE — AB
Opiates: NOT DETECTED

## 2012-02-12 LAB — BASIC METABOLIC PANEL
CO2: 27 mEq/L (ref 19–32)
Chloride: 103 mEq/L (ref 96–112)
GFR calc Af Amer: >90 mL/min (ref >90)
Potassium: 4.5 mEq/L (ref 3.5–5.1)
Sodium: 139 mEq/L (ref 135–145)

## 2012-02-12 LAB — ETHANOL: Alcohol, Ethyl (B): 324 mg/dL — ABNORMAL HIGH (ref 0–11)

## 2012-02-12 NOTE — ED Notes (Signed)
Pt. Gave a urine sample, Pt. Put water in specimen cup. Urine was clear and specimen cup was cold to touch.

## 2012-02-12 NOTE — ED Notes (Signed)
Per EMS - pt from home, pt reported to have seizure tonight, pt smells of strong ETOH, pt also admits to being suicidal w/o a plan. Pt w/ hx of seizures however EMS states no evidence of seizure, pt A&Ox4 on arrival, ambulatory w/ steady gait.

## 2012-02-12 NOTE — ED Notes (Signed)
Pt given food

## 2012-02-13 ENCOUNTER — Inpatient Hospital Stay (HOSPITAL_COMMUNITY)
Admission: AD | Admit: 2012-02-13 | Discharge: 2012-02-16 | DRG: 897 | Disposition: A | Discharge status: Home / Self Care | Payer: Federal, State, Local not specified - Other | Source: Ambulatory Visit | Attending: Psychiatry | Admitting: Psychiatry

## 2012-02-13 ENCOUNTER — Encounter (HOSPITAL_COMMUNITY): Payer: Self-pay | Admitting: *Deleted

## 2012-02-13 DIAGNOSIS — F10229 Alcohol dependence with intoxication, unspecified: Secondary | ICD-10-CM

## 2012-02-13 DIAGNOSIS — F1994 Other psychoactive substance use, unspecified with psychoactive substance-induced mood disorder: Secondary | ICD-10-CM

## 2012-02-13 DIAGNOSIS — Z8673 Personal history of transient ischemic attack (TIA), and cerebral infarction without residual deficits: Secondary | ICD-10-CM

## 2012-02-13 DIAGNOSIS — F10239 Alcohol dependence with withdrawal, unspecified: Principal | ICD-10-CM | POA: Diagnosis present

## 2012-02-13 DIAGNOSIS — J449 Chronic obstructive pulmonary disease, unspecified: Secondary | ICD-10-CM | POA: Diagnosis present

## 2012-02-13 DIAGNOSIS — Z79899 Other long term (current) drug therapy: Secondary | ICD-10-CM

## 2012-02-13 DIAGNOSIS — F1020 Alcohol dependence, uncomplicated: Secondary | ICD-10-CM | POA: Diagnosis present

## 2012-02-13 DIAGNOSIS — J4489 Other specified chronic obstructive pulmonary disease: Secondary | ICD-10-CM | POA: Diagnosis present

## 2012-02-13 DIAGNOSIS — Z23 Encounter for immunization: Secondary | ICD-10-CM

## 2012-02-13 DIAGNOSIS — F1410 Cocaine abuse, uncomplicated: Secondary | ICD-10-CM | POA: Diagnosis present

## 2012-02-13 DIAGNOSIS — F10939 Alcohol use, unspecified with withdrawal, unspecified: Principal | ICD-10-CM | POA: Diagnosis present

## 2012-02-13 HISTORY — DX: Chronic obstructive pulmonary disease, unspecified: J44.9

## 2012-02-13 MED ORDER — IBUPROFEN 600 MG PO TABS: 600.0000 mg | ORAL_TABLET | Freq: Three times a day (TID) | ORAL | Status: DC | PRN

## 2012-02-13 MED ORDER — LORAZEPAM 1 MG PO TABS: 0.0000 mg | ORAL_TABLET | Freq: Two times a day (BID) | ORAL | Status: DC

## 2012-02-13 MED ORDER — ADULT MULTIVITAMIN W/MINERALS CH
1.0000 | ORAL_TABLET | Freq: Every day | ORAL | Status: DC
  Administered 2012-02-13: 1 via ORAL
  Filled 2012-02-13: qty 1

## 2012-02-13 MED ORDER — ONDANSETRON HCL 4 MG PO TABS: 4.0000 mg | ORAL_TABLET | Freq: Three times a day (TID) | ORAL | Status: DC | PRN

## 2012-02-13 MED ORDER — ALUM & MAG HYDROXIDE-SIMETH 200-200-20 MG/5ML PO SUSP: 30.0000 mL | ORAL | Status: DC | PRN

## 2012-02-13 MED ORDER — ZOLPIDEM TARTRATE 5 MG PO TABS: 5.0000 mg | ORAL_TABLET | Freq: Every evening | ORAL | Status: DC | PRN

## 2012-02-13 MED ORDER — LEVETIRACETAM 500 MG PO TABS
500.0000 mg | ORAL_TABLET | Freq: Two times a day (BID) | ORAL | Status: DC
  Administered 2012-02-13 (×2): 500 mg via ORAL
  Filled 2012-02-13 (×2): qty 1

## 2012-02-13 MED ORDER — FOLIC ACID 1 MG PO TABS
1.0000 mg | ORAL_TABLET | Freq: Every day | ORAL | Status: DC
  Administered 2012-02-13: 1 mg via ORAL
  Filled 2012-02-13: qty 1

## 2012-02-13 MED ORDER — LORAZEPAM 1 MG PO TABS: 1.0000 mg | ORAL_TABLET | Freq: Four times a day (QID) | ORAL | Status: DC | PRN

## 2012-02-13 MED ORDER — LORAZEPAM 1 MG PO TABS
1.0000 mg | ORAL_TABLET | Freq: Three times a day (TID) | ORAL | Status: DC | PRN
  Filled 2012-02-13: qty 1

## 2012-02-13 MED ORDER — NICOTINE 21 MG/24HR TD PT24
21.0000 mg | MEDICATED_PATCH | Freq: Every day | TRANSDERMAL | Status: DC
  Administered 2012-02-13: 21 mg via TRANSDERMAL
  Filled 2012-02-13: qty 1

## 2012-02-13 MED ORDER — THIAMINE HCL 100 MG/ML IJ SOLN: 100.0000 mg | Freq: Every day | INTRAMUSCULAR | Status: DC

## 2012-02-13 MED ORDER — LORAZEPAM 1 MG PO TABS
0.0000 mg | ORAL_TABLET | Freq: Four times a day (QID) | ORAL | Status: DC
  Administered 2012-02-13: 1 mg via ORAL

## 2012-02-13 MED ORDER — LORAZEPAM 2 MG/ML IJ SOLN: 1.0000 mg | Freq: Four times a day (QID) | INTRAMUSCULAR | Status: DC | PRN

## 2012-02-13 MED ORDER — VITAMIN B-1 100 MG PO TABS
100.0000 mg | ORAL_TABLET | Freq: Every day | ORAL | Status: DC
  Administered 2012-02-13: 100 mg via ORAL
  Filled 2012-02-13: qty 1

## 2012-02-13 MED ORDER — PNEUMOCOCCAL VAC POLYVALENT 25 MCG/0.5ML IJ INJ
0.5000 mL | INJECTION | INTRAMUSCULAR | Status: AC
  Administered 2012-02-14: 0.5 mL via INTRAMUSCULAR

## 2012-02-13 MED ORDER — ACETAMINOPHEN 325 MG PO TABS: 650.0000 mg | ORAL_TABLET | ORAL | Status: DC | PRN

## 2012-02-13 NOTE — Tx Team (Signed)
Initial Interdisciplinary Treatment Plan  PATIENT STRENGTHS: (choose at least two) Ability for insight Average or above average intelligence Capable of independent living Communication skills General fund of knowledge Motivation for treatment/growth Physical Health Special hobby/interest Supportive family/friends Work skills  PATIENT STRESSORS: Loss of mother 2 yrs ago Legal issues Noncompliance of medications   PROBLEM LIST: Problem List/Patient Goals Date to be addressed Date deferred Reason deferred Estimated date of resolution  "Help me get off alcohol" 02/14/12     "Help me get my life straightened out off of stress" 02/14/12           Substance abuse 02/14/12     depression 02/14/12     Increased risk for suicide 02/14/12                        DISCHARGE CRITERIA:  Ability to meet basic life and health needs Adequate post-discharge living arrangements Improved stabilization in mood, thinking, and/or behavior Medical problems require only outpatient monitoring Motivation to continue treatment in a less acute level of care Need for constant or close observation no longer present Reduction of life-threatening or endangering symptoms to within safe limits Safe-care adequate arrangements made Verbal commitment to aftercare and medication compliance Withdrawal symptoms are absent or subacute and managed without 24-hour nursing intervention  PRELIMINARY DISCHARGE PLAN: Attend 12-step recovery group Outpatient therapy Participate in family therapy Return to previous living arrangement  PATIENT/FAMIILY INVOLVEMENT: This treatment plan has been presented to and reviewed with the patient, Mario Andrews, and/or family member.  The patient and family have been given the opportunity to ask questions and make suggestions.  [facility] 02/13/2012, 11:41 PM

## 2012-02-13 NOTE — ED Provider Notes (Signed)
History     CSN: [id number]  Arrival date & time 02/12/12  2121   First MD Initiated Contact with Patient 02/12/12 2347      12:17 AM HPI Pt is here requesting ETOH detox. Reports last drink was early this afternoon. Denies drug abuse otherwise. Reports when he does not drink he will develop abdominal pain. Denies tremors , or n/v.  Reports no prior h/o detox. Patient currently denies SI or HI. Denies seizures The history is provided by the patient.    Past Medical History  Diagnosis Date  . Anxiety   . Seizures     No past surgical history on file.  No family history on file.  History  Substance Use Topics  . Smoking status: Current Everyday Smoker -- 0.5 packs/day    Types: Cigarettes  . Smokeless tobacco: Not on file  . Alcohol Use: Yes     1 beer today      Review of Systems  Gastrointestinal: Positive for abdominal pain. Negative for nausea and vomiting.  Neurological: Negative for tremors.  Psychiatric/Behavioral: Positive for behavioral problems. Negative for suicidal ideas and self-injury.  All other systems reviewed and are negative.    Allergies  Peanut-containing drug products  Home Medications   Current Outpatient Rx  Name Route Sig Dispense Refill  . TRAZODONE HCL 50 MG PO TABS Oral Take 100 mg by mouth at bedtime.       BP 102/74  Pulse 80  Temp 98.3 F (36.8 C) (Oral)  Resp 16  SpO2 97%  Physical Exam  Vitals reviewed. Constitutional: He is oriented to person, place, and time. He appears well-developed and well-nourished.  HENT:  Head: Normocephalic and atraumatic.  Eyes: Pupils are equal, round, and reactive to light.  Neurological: He is alert and oriented to person, place, and time.  Skin: Skin is warm and dry. No rash noted. No erythema. No pallor.  Psychiatric: He has a normal mood and affect. His behavior is normal.       Malodorous of ETOH    ED Course  Procedures  Results for orders placed during the hospital encounter  of 02/12/12  CBC      Component Value Range   WBC 7.9  4.0 - 10.5 K/uL   RBC 4.48  4.22 - 5.81 MIL/uL   Hemoglobin 13.9  13.0 - 17.0 g/dL   HCT 78.2  95.6 - 21.3 %   MCV 87.9  78.0 - 100.0 fL   MCH 31.0  26.0 - 34.0 pg   MCHC 35.3  30.0 - 36.0 g/dL   RDW 08.6  57.8 - 46.9 %   Platelets 190  150 - 400 K/uL  DIFFERENTIAL      Component Value Range   Neutrophils Relative 36 (*) 43 - 77 %   Neutro Abs 2.8  1.7 - 7.7 K/uL   Lymphocytes Relative 55 (*) 12 - 46 %   Lymphs Abs 4.3 (*) 0.7 - 4.0 K/uL   Monocytes Relative 6  3 - 12 %   Monocytes Absolute 0.5  0.1 - 1.0 K/uL   Eosinophils Relative 3  0 - 5 %   Eosinophils Absolute 0.2  0.0 - 0.7 K/uL   Basophils Relative 0  0 - 1 %   Basophils Absolute 0.0  0.0 - 0.1 K/uL  BASIC METABOLIC PANEL      Component Value Range   Sodium 139  135 - 145 mEq/L   Potassium 4.5  3.5 - 5.1 mEq/L  Chloride 103  96 - 112 mEq/L   CO2 27  19 - 32 mEq/L   Glucose, Bld 79  70 - 99 mg/dL   BUN 4 (*) 6 - 23 mg/dL   Creatinine, Ser 1.61  0.50 - 1.35 mg/dL   Calcium 9.1  8.4 - 09.6 mg/dL   GFR calc non Af Amer >90  >90 mL/min   GFR calc Af Amer >90  >90 mL/min  ETHANOL      Component Value Range   Alcohol, Ethyl (B) 324 (*) 0 - 11 mg/dL  URINE RAPID DRUG SCREEN (HOSP PERFORMED)      Component Value Range   Opiates NONE DETECTED  NONE DETECTED   Cocaine POSITIVE (*) NONE DETECTED   Benzodiazepines NONE DETECTED  NONE DETECTED   Amphetamines NONE DETECTED  NONE DETECTED   Tetrahydrocannabinol NONE DETECTED  NONE DETECTED   Barbiturates NONE DETECTED  NONE DETECTED     MDM    Spoke with Barth Kirks, ACT who is aware of the patient and will evaluate him   Thomasene Lot, Cordelia Poche 02/13/12 0116

## 2012-02-13 NOTE — BHH Counselor (Signed)
Contacted [facility] to make a referral to the facility on patients behalf for detox. Spoke to Prosser whom verified that [facility] has bed availability. However, she took this writers contact number and stated that she would have to call back in 2o minutes as they are currently backed up taking other referrals.
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
## 2012-02-13 NOTE — BHH Counselor (Signed)
Pt referred to [facility] for inpatient detox. Pending review by their medical director.  *Staff at [facility] is concerned about patient's seizure history, patient unable to report what type of seizure's he may be having, seizure diagnosis is questionable, and he is not on any meds for his seizures if he is truly having a seizure.  Pt also referred to [facility] for consideration. Contacted the Assessment office and spoke to staff-Ella. Writer asked Samson Frederic to run patient for a inpatient admission as it has been difficulty trying to place patient at [facility] for detox.
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
## 2012-02-13 NOTE — ED Provider Notes (Signed)
  Physical Exam  BP 127/91  Pulse 109  Temp 98.2 F (36.8 C) (Oral)  Resp 16  SpO2 98%  Physical Exam  ED Course  Procedures  MDM Accepted at behavioral health. Dr readling      Harrold Donath R. Rubin Payor, MD 02/13/12 [id number]

## 2012-02-13 NOTE — ED Provider Notes (Signed)
Medical screening examination/treatment/procedure(s) were performed by non-physician practitioner and as supervising physician I was immediately available for consultation/collaboration.   Hanley Seamen, MD 02/13/12 [phone]

## 2012-02-14 DIAGNOSIS — F1020 Alcohol dependence, uncomplicated: Secondary | ICD-10-CM | POA: Diagnosis present

## 2012-02-14 MED ORDER — CHLORDIAZEPOXIDE HCL 25 MG PO CAPS
25.0000 mg | ORAL_CAPSULE | ORAL | Status: DC
  Administered 2012-02-16: 25 mg via ORAL
  Filled 2012-02-14: qty 1

## 2012-02-14 MED ORDER — ACETAMINOPHEN 325 MG PO TABS
650.0000 mg | ORAL_TABLET | Freq: Four times a day (QID) | ORAL | Status: DC | PRN
Start: 2012-02-14 — End: 2012-02-16

## 2012-02-14 MED ORDER — CHLORDIAZEPOXIDE HCL 25 MG PO CAPS
25.0000 mg | ORAL_CAPSULE | Freq: Four times a day (QID) | ORAL | Status: AC
  Administered 2012-02-14 (×2): 25 mg via ORAL
  Filled 2012-02-14 (×2): qty 1

## 2012-02-14 MED ORDER — CHLORDIAZEPOXIDE HCL 25 MG PO CAPS
25.0000 mg | ORAL_CAPSULE | Freq: Four times a day (QID) | ORAL | Status: DC | PRN
  Filled 2012-02-14: qty 1

## 2012-02-14 MED ORDER — ONDANSETRON 4 MG PO TBDP: 4.0000 mg | ORAL_TABLET | Freq: Four times a day (QID) | ORAL | Status: DC | PRN

## 2012-02-14 MED ORDER — HYDROXYZINE HCL 25 MG PO TABS: 25.0000 mg | ORAL_TABLET | Freq: Four times a day (QID) | ORAL | Status: DC | PRN

## 2012-02-14 MED ORDER — THIAMINE HCL 100 MG/ML IJ SOLN
100.0000 mg | Freq: Once | INTRAMUSCULAR | Status: AC
  Administered 2012-02-14: 100 mg via INTRAMUSCULAR

## 2012-02-14 MED ORDER — ADULT MULTIVITAMIN W/MINERALS CH
1.0000 | ORAL_TABLET | Freq: Every day | ORAL | Status: DC
  Administered 2012-02-14 – 2012-02-16 (×3): 1 via ORAL
  Filled 2012-02-14 (×5): qty 1

## 2012-02-14 MED ORDER — ALUM & MAG HYDROXIDE-SIMETH 200-200-20 MG/5ML PO SUSP: 30.0000 mL | ORAL | Status: DC | PRN

## 2012-02-14 MED ORDER — CHLORDIAZEPOXIDE HCL 25 MG PO CAPS
25.0000 mg | ORAL_CAPSULE | Freq: Three times a day (TID) | ORAL | Status: AC
  Administered 2012-02-15 (×3): 25 mg via ORAL
  Filled 2012-02-14 (×2): qty 1

## 2012-02-14 MED ORDER — VITAMIN B-1 100 MG PO TABS
100.0000 mg | ORAL_TABLET | Freq: Every day | ORAL | Status: DC
  Administered 2012-02-15 – 2012-02-16 (×2): 100 mg via ORAL
  Filled 2012-02-14 (×4): qty 1

## 2012-02-14 MED ORDER — MAGNESIUM HYDROXIDE 400 MG/5ML PO SUSP: 30.0000 mL | Freq: Every day | ORAL | Status: DC | PRN

## 2012-02-14 MED ORDER — HYDROXYZINE HCL 50 MG PO TABS
50.0000 mg | ORAL_TABLET | Freq: Every evening | ORAL | Status: DC | PRN
  Administered 2012-02-14 – 2012-02-15 (×3): 50 mg via ORAL

## 2012-02-14 MED ORDER — CHLORDIAZEPOXIDE HCL 25 MG PO CAPS: 25.0000 mg | ORAL_CAPSULE | Freq: Every day | ORAL | Status: DC

## 2012-02-14 MED ORDER — LOPERAMIDE HCL 2 MG PO CAPS: 2.0000 mg | ORAL_CAPSULE | ORAL | Status: DC | PRN

## 2012-02-14 NOTE — Progress Notes (Signed)
Pt. Denies Si/HI and denies A/V Hallucinations.  Py. Attended group this evening.  Support given.
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
## 2012-02-14 NOTE — Progress Notes (Signed)
BHH Group Notes:  (Counselor/Nursing/MHT/Case Management/Adjunct)  02/14/2012 2:29 PM  Type of Therapy:  Group Therapy at 11 and 1:15  Participation Level:  Did Not Attend   Clide Dales 02/14/2012, 2:29 PM
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
## 2012-02-14 NOTE — Progress Notes (Signed)
D: Pt denies SI/HI; pt seems to be in a sad mood and has a flat facial expression; pt denies any complaints and is appropriate and cooperative with staff  A: Pt given emotional support by staff; pt is encouraged to come to staff members with questions and/or concerns  R: Pt remains in a sad mood but is still cooperative and appropriate with staff; will continue to monitor pt for safety

## 2012-02-14 NOTE — Progress Notes (Signed)
D:Patient cooperative and resting complained of anxiety.  Patient new to the unit.  Patient denies SI/HI and denies AVH.  Patient flat and depressed. Patient had questions about advanced directives upon admission A: Patient given hydroxyzine for anxiety @ 0057 and offered encouragement and support.  Patient was given advanced directives handout for more information. R: Patient stated he would read advanced directives handout, Patient in bed resting no apparent distress.  Staff to monitor Q10min for safety.

## 2012-02-14 NOTE — Discharge Planning (Signed)
New patient attended AM group; good participation.  States he was supposed to go to [facility], but they were concerned about seizures.  Was there 3 years ago.  Also living at [facility] for awhile, but could not afford to continue.  Does well when living in structured setting, but plans to return to sister's house at d/c.  Unable to identify anything that he can do differently this time to stay sober.  Limited insight.  Works as a Designer, fashion/clothing.
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
## 2012-02-14 NOTE — H&P (Signed)
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  He states he has been drinking 5 (40 oz) beers a day for the last 8 years.  He notes that he also does crack occasionally.  He says his last use was 6/17.     He states his sleep is poor, he is significantly depressed, has increased anxiety and he notes that he has hopelessness.  Past Psychiatric History: Diagnosis:    Hospitalizations:  [facility] 2010  Outpatient Care:  [facility]  Substance Abuse Care:  Self-Mutilation:  Suicidal Attempts:  Violent Behaviors:   Past Medical History:   Past Medical History  Diagnosis Date  . COPD (chronic obstructive pulmonary disease)   . Seizures     last seizure 2 months ago   Seizure History:   Allergies:   Allergies  Allergen Reactions  . Peanut-Containing Drug Products Hives   PTA Medications: Prescriptions prior to admission  Medication Sig Dispense Refill  . traZODone (DESYREL) 50 MG tablet Take 100 mg by mouth at bedtime.         Previous Psychotropic Medications:  Medication/Dose                 Substance Abuse History in the last 12 months: Substance Age of 1st Use Last Use Amount Specific Type  Nicotine      Alcohol      Cannabis      Opiates      Cocaine      Methamphetamines      LSD      Ecstasy      Benzodiazepines      Caffeine      Inhalants      Others:                         Consequences of Substance Abuse: Withdrawal Symptoms:   Headaches Tremors  Social History: Current Place of Residence:   Place of Birth:   Family Members: Marital Status:  Single Children:  Sons:  Daughters: Relationships: Education:  9th grade Educational Problems/Performance: Religious Beliefs/Practices: History of Abuse  (Emotional/Phsycial/Sexual) Occupational Experiences; Hotel manager History:   Legal History: Court date for "Assault with a deadly weapon." July 9th Hobbies/Interests:  Family History:  History reviewed. No pertinent family history. ROS: Negative with the exception of the HPI. PE: Completed by MD in ED and I agree with those findings.  Mental Status Examination/Evaluation: Objective:  Appearance: Disheveled  Eye Contact::  Fair  Speech:  Clear and Coherent  Volume:  Normal  Mood:  Anxious and Depressed  Affect:  Congruent  Thought Process:  Intact and Linear  Orientation:  Full  Thought Content:  WDL  Suicidal Thoughts:  No  Homicidal Thoughts:  No  Memory:  Immediate;   Fair  Judgement:  Fair  Insight:  Fair  Psychomotor Activity:  Normal  tremors  Concentration:  Fair  Recall:  Fair  Akathisia:  No  Handed:    AIMS (if indicated):     Assets:  Communication Skills Desire for Improvement Housing Social Support Talents/Skills  Sleep:  Number of Hours: 5     Laboratory/X-Ray Psychological Evaluation(s)  UDS+ Cocaine [id number]    Assessment:    AXIS I:  Alcohol dependence, cocaine abuse AXIS II:  Deferred AXIS III:   Past Medical History  Diagnosis Date  . Anxiety   . Depression   . COPD (chronic obstructive pulmonary disease)   . Seizures     last seizure 2 months ago   AXIS IV:  problems with access to health care services AXIS V:  41-50 serious symptoms  Treatment Plan/Recommendations:  Treatment Plan Summary:  1. Daily contact with patient to assess and evaluate symptoms and progress in treatment.  2. Medication management  3. The patient will deny suicidal ideations or homicidal ideations for 48 hours prior to discharge and have a depression and anxiety rating of 3 or less. The patient will also deny any auditory or visual hallucinations or delusional thinking.  4. The patient will deny any symptoms of substance withdrawal at time of discharge.     Treatment Plan Summary: 1. Admit for crisis management, stabilization and detox. 2. Treat medical problems as needed. 3. Address emotional and mental health issues with the potential to increase risk for relapse. 4. ELOS: 3-5 days Current Medications:  Current Facility-Administered Medications  Medication Dose Route Frequency Provider Last Rate Last Dose  . acetaminophen (TYLENOL) tablet 650 mg  650 mg Oral Q6H PRN Wonda Cerise, MD      . alum & mag hydroxide-simeth (MAALOX/MYLANTA) 200-200-20 MG/5ML suspension 30 mL  30 mL Oral Q4H PRN Wonda Cerise, MD      . chlordiazePOXIDE (LIBRIUM) capsule 25 mg  25 mg Oral Q6H PRN Wonda Cerise, MD      . chlordiazePOXIDE (LIBRIUM) capsule 25 mg  25 mg Oral QID Alyson Kuroski-Mazzei, DO       Followed by  . chlordiazePOXIDE (LIBRIUM) capsule 25 mg  25 mg Oral TID Alyson Kuroski-Mazzei, DO       Followed by  . chlordiazePOXIDE (LIBRIUM) capsule 25 mg  25 mg Oral BH-qamhs Alyson Kuroski-Mazzei, DO       Followed by  . chlordiazePOXIDE (LIBRIUM) capsule 25 mg  25 mg Oral Daily Alyson Kuroski-Mazzei, DO      . hydrOXYzine (ATARAX/VISTARIL) tablet 25 mg  25 mg Oral Q6H PRN Wonda Cerise, MD      . hydrOXYzine (ATARAX/VISTARIL) tablet 50 mg  50 mg Oral QHS PRN,MR X 1 Wonda Cerise, MD   50 mg at 02/14/12 0057  . loperamide (IMODIUM) capsule 2-4 mg  2-4 mg Oral PRN Wonda Cerise, MD      . magnesium hydroxide (MILK OF MAGNESIA) suspension 30 mL  30 mL Oral Daily PRN Wonda Cerise, MD      . multivitamin with minerals tablet 1 tablet  1 tablet Oral Daily Wonda Cerise, MD   1 tablet at 02/14/12 0802  . ondansetron (ZOFRAN-ODT) disintegrating tablet 4 mg  4 mg Oral Q6H PRN Wonda Cerise, MD      . pneumococcal 23 valent vaccine (PNU-IMMUNE) injection 0.5 mL  0.5 mL Intramuscular Tomorrow-1000 Alyson Kuroski-Mazzei, DO   0.5 mL at 02/14/12 1031  . thiamine (B-1) injection 100 mg  100 mg Intramuscular Once Wonda Cerise, MD   100 mg at 02/14/12 0054  . thiamine (VITAMIN B-1) tablet 100  mg  100 mg Oral Daily Wonda Cerise, MD       Facility-Administered Medications Ordered in Other Encounters  Medication Dose Route Frequency Provider Last Rate Last Dose  . DISCONTD: acetaminophen (TYLENOL) tablet 650 mg  650 mg Oral Q4H PRN Thomasene Lot, PA-C      . DISCONTD: alum & mag hydroxide-simeth (MAALOX/MYLANTA) 200-200-20 MG/5ML suspension 30 mL  30 mL Oral PRN Thomasene Lot, PA-C      . DISCONTD: folic acid (FOLVITE) tablet 1 mg  1 mg Oral Daily Thomasene Lot, PA-C   1 mg at 02/13/12 0951  . DISCONTD: ibuprofen (ADVIL,MOTRIN) tablet 600 mg  600 mg Oral Q8H PRN Thomasene Lot, PA-C      . DISCONTD: levETIRAcetam (KEPPRA) tablet 500 mg  500 mg Oral BID Lyanne Co, MD   500 mg at 02/13/12 2155  . DISCONTD: LORazepam (ATIVAN) injection 1 mg  1 mg Intravenous Q6H PRN Thomasene Lot, PA-C      . DISCONTD: LORazepam (ATIVAN) tablet 0-4 mg  0-4 mg Oral Q6H Thomasene Lot, PA-C   1 mg at 02/13/12 1317  . DISCONTD: LORazepam (ATIVAN) tablet 0-4 mg  0-4 mg Oral Q12H Thomasene Lot, PA-C      . DISCONTD: LORazepam (ATIVAN) tablet 1 mg  1 mg Oral Q8H PRN Thomasene Lot, PA-C      . DISCONTD: LORazepam (ATIVAN) tablet 1 mg  1 mg Oral Q6H PRN Thomasene Lot, PA-C      . DISCONTD: multivitamin with minerals tablet 1 tablet  1 tablet Oral Daily Thomasene Lot, PA-C   1 tablet at 02/13/12 0951  . DISCONTD: nicotine (NICODERM CQ - dosed in mg/24 hours) patch 21 mg  21 mg Transdermal Daily Thomasene Lot, PA-C   21 mg at 02/13/12 0951  . DISCONTD: ondansetron (ZOFRAN) tablet 4 mg  4 mg Oral Q8H PRN Thomasene Lot, PA-C      . DISCONTD: thiamine (B-1) injection 100 mg  100 mg Intravenous Daily Thomasene Lot, PA-C      . DISCONTD: thiamine (VITAMIN B-1) tablet 100 mg  100 mg Oral Daily Thomasene Lot, PA-C   100 mg at 02/13/12 0951  . DISCONTD: zolpidem (AMBIEN) tablet 5 mg  5 mg Oral QHS PRN Thomasene Lot, PA-C        Observation Level/Precautions:  Routine  Laboratory:     Psychotherapy:    Medications:    Routine PRN Medications:  Yes  Consultations:    Discharge Concerns:    Other:     Lloyd Huger T. Shakenna Herrero PAC for  Dr. Lupe Carney 6/19/20131:56 PM

## 2012-02-15 DIAGNOSIS — F1020 Alcohol dependence, uncomplicated: Secondary | ICD-10-CM

## 2012-02-15 MED ORDER — ENSURE COMPLETE PO LIQD
237.0000 mL | Freq: Every day | ORAL | Status: DC
  Administered 2012-02-15: 237 mL via ORAL

## 2012-02-15 NOTE — Progress Notes (Signed)
Patient ID: Mario Andrews, male   DOB: 09-13-1960, 51 y.o.   MRN: [id number] D: Pt. Lying in bed, sits up to talk to writer, no tremors noted. Pt. Concerns about sleeping tonight. Pt. Denies SHI. A: Writer to administer Vistaril for sleep.  Writer encouraged group tonight. R: Pt. Agreed to attend group and will receive sleep med. Staff will monitor q27min for safety.

## 2012-02-15 NOTE — Progress Notes (Signed)
02/15/2012         Time: 1415      Group Topic/Focus: The focus of this group is on discussing various styles of communication and communicating assertively using 'I' (feeling) statements.   Participation Level: Minimal  Participation Quality: Redirectable  Affect: Blunted  Cognitive: Alert  Additional Comments: Patient participated with encouragement, engaging in frequent negative self-talk.   Veleda Mun 02/15/2012 3:46 PM

## 2012-02-15 NOTE — Treatment Plan (Signed)
Interdisciplinary Treatment Plan Update (Adult)  Date: 02/15/2012  Time Reviewed: 11:19 AM   Progress in Treatment: Attending groups: Yes Participating in groups: Yes Taking medication as prescribed: Yes Tolerating medication: Yes   Family/Significant other contact made:  No Patient understands diagnosis:  Yes  As evidenced by asking for help with alcohol detox Discussing patient identified problems/goals with staff:  Yes  See below Medical problems stabilized or resolved:  Yes Denies suicidal/homicidal ideation: Yes  In AM group Issues/concerns per patient self-inventory:  Yes  Depression 8, hopelessness 6,  Sleep poor Other:  New problem(s) identified: N/A  Reason for Continuation of Hospitalization: Depression Medication stabilization Withdrawal symptoms  Interventions implemented related to continuation of hospitalization:   Additional comments:  Estimated length of stay:  Discharge Plan:  New goal(s): N/A  Review of initial/current patient goals per problem list:   1.  Goal(s): Safely detox from alcohol  Met:  No  Target date:6/22  As evidenced by: stable vitals, CIWA score of 0  2.  Goal (s): Identify comprehensive sobriety plan  Met:  No  Target date:6/21  As evidenced by:so far, the only thing Van says is that he does not want to go to rehab  3.  Goal(s): Decrease depression and anxiety  Met:  No  Target date:6/22  As evidenced by: Onalee Hua will rate both at a 4 or less at d/c  4.  Goal(s):  Met:  No  Target date:  As evidenced by:  Attendees: Patient:     Family:     Physician:  Lupe Carney 02/15/2012 11:19 AM   Nursing:    02/15/2012 11:19 AM   Case Manager:  Richelle Ito, LCSW 02/15/2012 11:19 AM   Counselor:  02/15/2012 11:19 AM   Other:     Other:     Other:     Other:      Scribe for Treatment Team:   Ida Rogue, 02/15/2012 11:19 AM

## 2012-02-15 NOTE — Progress Notes (Signed)
[facility] Group Notes:  (Counselor/Nursing/MHT/Case Management/Adjunct)  02/15/2012 4:08 PM  Type of Therapy:  Group Therapy at 11  Participation Level:  Minimal  Participation Quality:  Attentive  Affect:  Appropriate  Cognitive:  Alert and Oriented  Insight:  None shared  Engagement in Group:  Limited  Engagement in Therapy:  Limited  Modes of Intervention:  Orientation, Socialization and Support  Summary of Progress/Problems: Mario Andrews attended first group therapy session of stay and was very attentive but quiet during discussion on balance in life.  He did share with group that "alcohol and other stuff led to his hospitalization."    Clide Dales 02/15/2012, 4:08 PM

## 2012-02-15 NOTE — Progress Notes (Signed)
D) Patient reports sleep was poor last night, appetite good, energy level normal, ability to pay attention good. Rates depression as 8/10 and feelings of hopelessness as 6/10. Reports sedation from withdrawal medications, denies SI thoughts and endorses headaches and lightheadedness. Patient presents as disheveled and flat. A) Patient encouraged to increase fluid intake and to review coping mechanisms for stress. Also encouraged to attend AA meeting this AM.  R) Patient receptive to interaction with staff.  Joice Lofts RN MS EdS 02/15/2012  11:12 AM

## 2012-02-15 NOTE — Progress Notes (Signed)
[facility] Adult Inpatient Family/Significant Other Collateral Contact  Collateral Contact:  Patient's sister, Mitchel Delduca at [phone] been identified by the patient as the family member/significant other who can provide for collateral information. With written consent from the patient, several attempts have been made to contact sister without success. Message on phone states "Unavailable; please try again."    First Attempt made 02/14/2012 at 2:00 PM  Second attempt made on 02/15/2012  at 10:30 am and 2:49 PM   Case manager has made contact with sister and reports patient will be unable to return to her home.   Clide Dales 02/15/2012 2:50 PM

## 2012-02-15 NOTE — Discharge Planning (Signed)
Undeterred by the news that he cannot return to his sister's house, Colon reports that he will find another place to stay.  Plans to call father today.  Let him know I needed to know where he will land.  He agreed to let me know.  Rejects rehab.  Wants to get back to work again "so I can support my kids."  C/O minimal withdrawal symptoms.

## 2012-02-15 NOTE — Progress Notes (Signed)
[facility] MD Progress Note                                         02/15/2012    Mario Andrews 07-18-1961    [id number] Hospital day #2  Dx:  Alcohol dependence The patient was seen today and reports the following:  Sleep: poor sleep Appetite: good  Mild>(1-10) >Severe  Hopelessness (1-10): Depression (1-10):  Anxiety (1-10):  Suicidal Ideation: The patient denies suicidal ideation. Plan: None Intent: None Means:  None Homicidal Ideation: The patient denies homicidal ideation. Plan: None Intent: None Means: None  Eye Contact: Good.  General Appearance/Behavior: cooperative and pleasant. Motor Behavior: normal Speech: clear and coherant  Mental Status: oriented x 3 Level of Consciousness:  alert Mood: euthymic Affect: congruent  Thought Process: linear Thought Content: without AH/VH Perception: intact  Judgment: fair Insight: fair Cognition: at least average       height is 6\' 1"  (1.854 m) and weight is 63.504 kg (140 lb). His oral temperature is 98.1 F (36.7 C). His blood pressure is 96/67 and his pulse is 104. His respiration is 17.   . chlordiazePOXIDE  25 mg Oral QID   Followed by  . chlordiazePOXIDE  25 mg Oral TID   Followed by  . chlordiazePOXIDE  25 mg Oral BH-qamhs   Followed by  . chlordiazePOXIDE  25 mg Oral Daily  . feeding supplement  237 mL Oral QPC supper  . multivitamin with minerals  1 tablet Oral Daily  . thiamine  100 mg Oral Daily    No results found for this or any previous visit (from the past 48 hour(s).   ROS:    Constitutional: WDWN AAF NAD   GI: Negative for N,V,D,C   Neuro: Negative for dizziness, blurred vision, visual changes, headaches   Resp: Negative for wheezing, SOB, cough   Cardio: Negative for CP, diaphoresis, fatigue   MSK: Negative for joint pain, swelling, DROM, or ambulatory difficulties.  Time is spent with the patient discussing his symptoms.  He reports no new symptoms and denies further symptoms of  withdrawal.  He states he is attending groups.  He is on day two of his detox protocol.  Treatment plan: 1. Continue current plan of care with no changes at this time. 2. ELOS is 3-5 days.  Rona Ravens. Obera Stauch [facility] 02/15/2012

## 2012-02-15 NOTE — Progress Notes (Signed)
Brief Nutrition Note  Reason: nutrition risk for unintentional weight loss > 10 lb over 1 month.   Patient reported his appetite is well now. He reported a 20 lb weight loss. He stated his previous weight was 160 lb. Patient 87.5% of UBW. Prior to admission patient reported he only ate one meal a day, typically a Film/video editor. Patient voiced snack preferences. He l;ikes cheese, crackers and yogurt. He agreed to try Chocolate Ensure to increase caloric intake.   Wt Readings from Last 10 Encounters:  02/13/12 140 lb (63.504 kg)  08/28/11 161 lb (73.029 kg)  08/03/11 157 lb (71.215 kg)   Height: 6'1" Weight: 140 lb. BMI: 18.4 kg/m^2 (Underweight)  Patient was without any nutrition related questions or concerns. I have encouraged the patient to eat regularly.   Will order patient 1 Ensure nutrition supplement daily.  Recommend provide patient snacks with protein at snack times: cheese or yogurt. (patient said no peanut butter)  RD available for nutrition needs.  Iven Finn [facility] [phone]

## 2012-02-15 NOTE — Progress Notes (Signed)
Patient ID: Mario Andrews, male   DOB: 1961-07-19, 51 y.o.   MRN: [id number] D: Pt. In bed resting, eyes closed, no distress noted. A: Pt. Respirations even, unlabored. R:  Staff will monitor q41min for safety.

## 2012-02-16 MED ORDER — TRAZODONE HCL 50 MG PO TABS: 100.0000 mg | ORAL_TABLET | Freq: Every day | ORAL | Status: DC

## 2012-02-16 NOTE — [facility] Suicide Risk Assessment (Signed)
Suicide Risk Assessment  Discharge Assessment     Demographic factors:  Male;Caucasian;Unemployed    Current Mental Status Per Nursing Assessment::   On Admission:    At Discharge:     Current Mental Status Per Physician:pt is awake, alert, and oriented.  He has spontaneous speech.  He has a strong accent. He came to [place] because his father, with whom he was living, had a stoke and moved in with pt's brother.  When he came to [place], to live with his sister.  She was using drugs. He began smoking Cocaine from 17th until he came to the ED.  He has gone to rehab X2  He 2-3 40 oz been daily  He knew to come to [facility].  He denies Si HI He denies AH VH  His sister called to charge pt with assault with a knife  - another reason to come to [facility].  He demonstrates he is cognitively intact.He completed the 9th grade and was 51 yo. He has worked as a Designer, fashion/clothing.  He repeated 1st grade.  He says he has stopped thinking about alcohol or cocaine.  His judgment is good; insight is improved.    Loss Factors: Decrease in vocational status  Historical Factors:    Risk Reduction Factors:      Continued Clinical Symptoms:  Alcohol/Substance Abuse/Dependencies  Discharge Diagnoses:   AXIS I:  Substance Abuse AXIS II:  Deferred AXIS III:   Past Medical History  Diagnosis Date  . Anxiety   . Depression   . COPD (chronic obstructive pulmonary disease)   . Seizures     last seizure 2 months ago   AXIS IV:  economic problems, educational problems, housing problems, problems related to social environment and problems with primary support group AXIS V:  61-70 mild symptoms  Cognitive Features That Contribute To Risk:  Thought constriction (tunnel vision)    Suicide Risk:  Minimal: No identifiable suicidal ideation.  Patients presenting with no risk factors but with morbid ruminations; may be classified as minimal risk based on the severity of the depressive symptoms  Plan Of Care/Follow-up  recommendations:  Activity:  pt will go to [facility] to get medications  Other:  Pt will attend AA and go regularly Wed. Nights.   Damiean Lukes 02/16/2012, 1:24 PM

## 2012-02-16 NOTE — Progress Notes (Signed)
[facility] Group Notes:  (Counselor/Nursing/MHT/Case Management/Adjunct)  02/16/2012  Type of Therapy:  Group Therapy at 11  Participation Level:  Active  Participation Quality:  Attentive  Affect:  Appropriate  Cognitive:  Appropriate  Insight:  Limited  Engagement in Group:  Good  Engagement in Therapy:  Limited  Modes of Intervention:  Education and Support  Summary of Progress/Problems:  Levie was attentive to discussion on Post Acute Withdrawal Syndrome (PAWS).  This was the first group therapy session Makael showed interest in.   [facility] Group Notes:  (Counselor/Nursing/MHT/Case Management/Adjunct)  02/16/2012  Type of Therapy:  Group Therapy at 1:15  Participation Level:  Did Not Attend  Clide Dales 02/16/2012, 3:56 PM

## 2012-02-16 NOTE — Progress Notes (Signed)
[facility] Case Management Discharge Plan:  Will you be returning to the same living situation after discharge: No. At discharge, do you have transportation home?:Yes,  buspass Do you have the ability to pay for your medications:Yes,  no meds  Interagency Information:     Release of information consent forms completed and in the chart;  Patient's signature needed at discharge.  Patient to Follow up at:  Follow-up Information    Follow up with [facility] AA mtgs in 90 days.  Get a sponser.         Patient denies SI/HI:   Yes,  yes    Safety Planning and Suicide Prevention discussed:  Yes,  yes  Barrier to discharge identified:No.  Summary and Recommendations:   Mario Andrews 02/16/2012, 10:50 AM

## 2012-02-16 NOTE — Progress Notes (Signed)
Patient ID: Mario Andrews, male   DOB: 1960/12/16, 51 y.o.   MRN: [id number] Discharge Note. (D)Pt reported he feels ready to be discharged. Pt shared that he has gotten the help he needed and plans on returning to AA. Pt also reported he is going to look into getting a sponsor. Pt denied any SI/HI. Denied any hallucinations. Pt denied any pain. Pt's affect and mood appropriate and congruent. (A)Dischrage instructions given. Rx for Trazodone given, education on medication done. Encouraged pt to continue with AA to maintain sobriety. All questions answered. Bus pass given. (R)Pt receptive to education. Pt reported he feels good and that he will continue on his path to sobriety.

## 2012-02-19 NOTE — Progress Notes (Signed)
Patient Discharge Instructions: No f/u.  Wandra Scot, 02/19/2012, 11:54 AM

## 2012-02-20 ENCOUNTER — Emergency Department (HOSPITAL_COMMUNITY)
Admission: EM | Admit: 2012-02-20 | Discharge: 2012-02-21 | Disposition: A | Discharge status: Other Acute Inpatient Hospital | Payer: Medicaid Other | Attending: Emergency Medicine | Admitting: Emergency Medicine

## 2012-02-20 ENCOUNTER — Encounter (HOSPITAL_COMMUNITY): Payer: Self-pay

## 2012-02-20 DIAGNOSIS — F1910 Other psychoactive substance abuse, uncomplicated: Secondary | ICD-10-CM | POA: Insufficient documentation

## 2012-02-20 DIAGNOSIS — IMO0002 Reserved for concepts with insufficient information to code with codable children: Secondary | ICD-10-CM | POA: Insufficient documentation

## 2012-02-20 DIAGNOSIS — F3289 Other specified depressive episodes: Secondary | ICD-10-CM | POA: Insufficient documentation

## 2012-02-20 DIAGNOSIS — F329 Major depressive disorder, single episode, unspecified: Secondary | ICD-10-CM | POA: Insufficient documentation

## 2012-02-20 LAB — COMPREHENSIVE METABOLIC PANEL
ALT: 75 U/L — ABNORMAL HIGH (ref 0–53)
AST: 119 U/L — ABNORMAL HIGH (ref 0–37)
Alkaline Phosphatase: 116 U/L (ref 39–117)
GFR calc Af Amer: >90 mL/min (ref >90)
Glucose, Bld: 92 mg/dL (ref 70–99)
Potassium: 3.5 mEq/L (ref 3.5–5.1)
Sodium: 138 mEq/L (ref 135–145)
Total Protein: 7.5 g/dL (ref 6.0–8.3)

## 2012-02-20 LAB — CBC
Hemoglobin: 11.6 g/dL — ABNORMAL LOW (ref 13.0–17.0)
MCHC: 33.8 g/dL (ref 30.0–36.0)
RBC: 3.89 MIL/uL — ABNORMAL LOW (ref 4.22–5.81)

## 2012-02-20 LAB — RAPID URINE DRUG SCREEN, HOSP PERFORMED
Amphetamines: NOT DETECTED
Benzodiazepines: POSITIVE — AB
Tetrahydrocannabinol: NOT DETECTED

## 2012-02-20 LAB — DIFFERENTIAL
Basophils Relative: 3 % — ABNORMAL HIGH (ref 0–1)
Lymphs Abs: 2.9 10*3/uL (ref 0.7–4.0)
Monocytes Relative: 13 % — ABNORMAL HIGH (ref 3–12)
Neutro Abs: 3 10*3/uL (ref 1.7–7.7)
Neutrophils Relative %: 42 % — ABNORMAL LOW (ref 43–77)

## 2012-02-20 MED ORDER — THIAMINE HCL 100 MG/ML IJ SOLN: 100.0000 mg | Freq: Every day | INTRAMUSCULAR | Status: DC

## 2012-02-20 MED ORDER — LORAZEPAM 2 MG/ML IJ SOLN: 1.0000 mg | Freq: Four times a day (QID) | INTRAMUSCULAR | Status: DC | PRN

## 2012-02-20 MED ORDER — VITAMIN B-1 100 MG PO TABS
100.0000 mg | ORAL_TABLET | Freq: Every day | ORAL | Status: DC
  Administered 2012-02-20: 100 mg via ORAL
  Filled 2012-02-20: qty 1

## 2012-02-20 MED ORDER — ALUM & MAG HYDROXIDE-SIMETH 200-200-20 MG/5ML PO SUSP: 30.0000 mL | ORAL | Status: DC | PRN

## 2012-02-20 MED ORDER — ACETAMINOPHEN 325 MG PO TABS: 650.0000 mg | ORAL_TABLET | ORAL | Status: DC | PRN

## 2012-02-20 MED ORDER — ZOLPIDEM TARTRATE 5 MG PO TABS: 5.0000 mg | ORAL_TABLET | Freq: Every evening | ORAL | Status: DC | PRN

## 2012-02-20 MED ORDER — LORAZEPAM 1 MG PO TABS: 1.0000 mg | ORAL_TABLET | Freq: Four times a day (QID) | ORAL | Status: DC | PRN

## 2012-02-20 MED ORDER — ADULT MULTIVITAMIN W/MINERALS CH
1.0000 | ORAL_TABLET | Freq: Every day | ORAL | Status: DC
  Administered 2012-02-20: 1 via ORAL
  Filled 2012-02-20: qty 1

## 2012-02-20 MED ORDER — FOLIC ACID 1 MG PO TABS
1.0000 mg | ORAL_TABLET | Freq: Every day | ORAL | Status: DC
  Administered 2012-02-20: 1 mg via ORAL
  Filled 2012-02-20: qty 1

## 2012-02-20 MED ORDER — NICOTINE 21 MG/24HR TD PT24: 21.0000 mg | MEDICATED_PATCH | Freq: Every day | TRANSDERMAL | Status: DC

## 2012-02-20 MED ORDER — LORAZEPAM 1 MG PO TABS
1.0000 mg | ORAL_TABLET | Freq: Three times a day (TID) | ORAL | Status: DC | PRN
  Administered 2012-02-20: 1 mg via ORAL
  Filled 2012-02-20: qty 1

## 2012-02-20 NOTE — BH Assessment (Signed)
Assessment Note   Mario Andrews is a 51 y.o. male who presents to [facility] with SI/Depression.  Pt reports recent d/c from [facility] on 02/16/12 and returns req help with detox from alcohol/cocaine and depression.  Pt says he is SI w/plan to cut wrists.  Pt admits to being non-compliant with d/c plan from [facility].  Pt says he started using alcohol/crack after he was d/c on Friday. Pt says he's been drinking 2-40's daily since Friday and used $100 Crack on Friday and Saturday.  Pt.'s stressors: legal charges(Assualt w/deadly weapon and B&E w/Larceny) court dates are 02/28/12 and 03/05/12.  Pt told this Clinical research associate doesn't know why he wasn't compliant with d/c plan but now he's ready to work on getting help and staying sober--"I was looking at the environment around me and it's going to get worse, I feel like I'm going to hurt myself".  This Clinical research associate did not find d/c summary from [facility] to confirm d/c plan and follow up.   Axis I: Mood Disorder NOS and Substance Abuse Axis II: Deferred Axis III:  Past Medical History  Diagnosis Date  . Anxiety   . Depression   . COPD (chronic obstructive pulmonary disease)   . Seizures     last seizure 2 months ago   Axis IV: housing problems, other psychosocial or environmental problems, problems related to legal system/crime, problems related to social environment and problems with primary support group Axis V: 31-40 impairment in reality testing  Past Medical History:  Past Medical History  Diagnosis Date  . Anxiety   . Depression   . COPD (chronic obstructive pulmonary disease)   . Seizures     last seizure 2 months ago    History reviewed. No pertinent past surgical history.  Family History: History reviewed. No pertinent family history.  Social History:  reports that he has been smoking Cigarettes.  He has a 18 pack-year smoking history. He does not have any smokeless tobacco history on file. He reports that he drinks alcohol. He reports that he uses illicit drugs  ("Crack" cocaine).  Additional Social History:  Alcohol / Drug Use Pain Medications: None Prescriptions: None Over the Counter: None History of alcohol / drug use?: Yes Longest period of sobriety (when/how long): 1 week Negative Consequences of Use: Personal relationships;Legal Substance #1 Name of Substance 1: Alcohol  1 - Age of First Use: 14 YOM 1 - Amount (size/oz): 2-40's 1 - Frequency: Daily  1 - Duration: 5 Days  1 - Last Use / Amount: 02/20/12 Substance #2 Name of Substance 2: Crack 2 - Age of First Use: 17 YOM  2 - Amount (size/oz): $100 2 - Frequency: Daily  2 - Duration: 2 Days---Used Friday and Saturday after d/c from [facility] on 02/16/12 2 - Last Use / Amount: 02/17/12  CIWA: CIWA-Ar BP: 101/70 mmHg Pulse Rate: 77  Nausea and Vomiting: no nausea and no vomiting Tactile Disturbances: none Tremor: no tremor Auditory Disturbances: not present Paroxysmal Sweats: no sweat visible Visual Disturbances: not present Anxiety: no anxiety, at ease Headache, Fullness in Head: none present Agitation: normal activity Orientation and Clouding of Sensorium: oriented and can do serial additions CIWA-Ar Total: 0  COWS: Clinical Opiate Withdrawal Scale (COWS) Resting Pulse Rate: Pulse Rate 80 or below Sweating: No report of chills or flushing Restlessness: Able to sit still Pupil Size: Pupils pinned or normal size for room light Bone or Joint Aches: Not present Runny Nose or Tearing: Not present GI Upset: No GI symptoms Tremor:  No tremor Yawning: No yawning Anxiety or Irritability: None  Allergies:  Allergies  Allergen Reactions  . Peanut-Containing Drug Products Hives    Home Medications:  (Not in a hospital admission)  OB/GYN Status:  No LMP for male patient.  General Assessment Data Location of Assessment: [facility] Living Arrangements: Other relatives (Currently lives with brother ) Can pt return to current living arrangement?: Yes Admission Status: Voluntary Is  patient capable of signing voluntary admission?: Yes Transfer from: Acute Hospital Referral Source: MD  Education Status Is patient currently in school?: No Current Grade: None Highest grade of school patient has completed: 9th - "I just quit going" Name of school: None  Contact person: None   Risk to self Suicidal Ideation: Yes-Currently Present Suicidal Intent: No Is patient at risk for suicide?: Yes Suicidal Plan?: Yes-Currently Present Specify Current Suicidal Plan: Plan to cut wrists  Access to Means: Yes Specify Access to Suicidal Means: Sharps, Knives, Razors What has been your use of drugs/alcohol within the last 12 months?: Abusing alcohol, cocaine  Previous Attempts/Gestures: No How many times?: 0  Other Self Harm Risks: None Triggers for Past Attempts: None known Intentional Self Injurious Behavior: None Family Suicide History: No Recent stressful life event(s): Other (Comment) (Recent d/c from [facility]; Non compliance w/ discharge plan ) Persecutory voices/beliefs?: No Depression: Yes Depression Symptoms: Loss of interest in usual pleasures Substance abuse history and/or treatment for substance abuse?: Yes Suicide prevention information given to non-admitted patients: Not applicable  Risk to Others Homicidal Ideation: No Thoughts of Harm to Others: No Current Homicidal Intent: No Current Homicidal Plan: No Access to Homicidal Means: No Identified Victim: None  History of harm to others?: No Assessment of Violence: None Noted Violent Behavior Description: None  Does patient have access to weapons?: No Criminal Charges Pending?: Yes Describe Pending Criminal Charges: Assault w/deadly weapon; B&E w/larceny Does patient have a court date: Yes Court Date: 02/28/12 (03/05/12)  Psychosis Hallucinations: None noted Delusions: None noted  Mental Status Report Appear/Hygiene: Disheveled;Poor hygiene Eye Contact: Fair Motor Activity: Unremarkable Speech:  Logical/coherent Level of Consciousness: Alert Mood: Sad Affect: Sad Anxiety Level: None Thought Processes: Coherent;Relevant Judgement: Impaired Orientation: Person;Place;Time;Situation Obsessive Compulsive Thoughts/Behaviors: None  Cognitive Functioning Concentration: Normal Memory: Recent Intact;Remote Intact IQ: Average Insight: Poor Impulse Control: Fair Appetite: Fair Weight Loss: 0  Weight Gain: 0  Sleep: No Change Total Hours of Sleep: 8  Vegetative Symptoms: None  ADLScreening [facility] Assessment Services) Patient's cognitive ability adequate to safely complete daily activities?: Yes Patient able to express need for assistance with ADLs?: Yes Independently performs ADLs?: Yes  Abuse/Neglect [facility]) Physical Abuse: Denies Verbal Abuse: Denies Sexual Abuse: Denies  Prior Inpatient Therapy Prior Inpatient Therapy: Yes Prior Therapy Dates: 2013 Prior Therapy Facilty/Provider(s): [facility] Reason for Treatment: Detox/Depression   Prior Outpatient Therapy Prior Outpatient Therapy: No Prior Therapy Dates: None  Prior Therapy Facilty/Provider(s): None  Reason for Treatment: None   ADL Screening (condition at time of admission) Patient's cognitive ability adequate to safely complete daily activities?: Yes Patient able to express need for assistance with ADLs?: Yes Independently performs ADLs?: Yes Weakness of Legs: None Weakness of Arms/Hands: None  Home Assistive Devices/Equipment Home Assistive Devices/Equipment: Eyeglasses  Therapy Consults (therapy consults require a physician order) PT Evaluation Needed: No OT Evalulation Needed: No SLP Evaluation Needed: No Abuse/Neglect Assessment (Assessment to be complete while patient is alone) Physical Abuse: Denies Verbal Abuse: Denies Sexual Abuse: Denies Exploitation of patient/patient's resources: Denies Self-Neglect: Denies Values / Beliefs Cultural  Requests During Hospitalization: None Spiritual Requests During  Hospitalization: None Consults Spiritual Care Consult Needed: No Social Work Consult Needed: No Merchant navy officer (For Healthcare) Advance Directive: Patient does not have advance directive;Patient would not like information Pre-existing out of facility DNR order (yellow form or pink MOST form): No Nutrition Screen Diet: Regular Unintentional weight loss greater than 10lbs within the last month: No Problems chewing or swallowing foods and/or liquids: No Home Tube Feeding or Total Parenteral Nutrition (TPN): No Patient appears severely malnourished: No  Additional Information 1:1 In Past 12 Months?: No CIRT Risk: No Elopement Risk: No Does patient have medical clearance?: Yes     Disposition:  Disposition Disposition of Patient: Inpatient treatment program;Referred to Type of inpatient treatment program: Adult Patient referred to: Other (Comment) [facility])  On Site Evaluation by:   Reviewed with Physician:     Murrell Redden 02/20/2012 9:45 PM

## 2012-02-20 NOTE — ED Notes (Signed)
Pt states he has plan to kill himself by cutting his wrists.  He is also requesting drug and etoh treatment.  Denies HI.

## 2012-02-20 NOTE — ED Notes (Signed)
Pt reports he was d/c from [facility] on last Friday and started back drinking and smoking crack. Pt states he is homeless and was d/c with medication and a plan that he did not comply with, pt states he did not take the medication he was given because it makes him sick. Pt uninterested in answering questions, states he just wants food and a warm blanket, Hotel manager out of his room, stating you can read my chart or check my "stuff" all the paperwork from "them" is in there.

## 2012-02-20 NOTE — ED Notes (Signed)
Two pt belonging bags locked in locker 34

## 2012-02-20 NOTE — ED Provider Notes (Signed)
History     CSN: [id number]  Arrival date & time 02/20/12  1756   First MD Initiated Contact with Patient 02/20/12 1859      Chief Complaint  Patient presents with  . Suicidal    (Consider location/radiation/quality/duration/timing/severity/associated sxs/prior treatment) HPI The patient is feeling suicidal for the past several days. He is depressed over family matters wishes to cut his wrists. Also wishes help with his cocaine and alcohol problems. No treatment prior to coming here. Patient is currently depressed also feels anxious. No other associated symptoms Past Medical History  Diagnosis Date  . Anxiety   . Depression   . COPD (chronic obstructive pulmonary disease)   . Seizures     last seizure 2 months ago    History reviewed. No pertinent past surgical history.  History reviewed. No pertinent family history.  History  Substance Use Topics  . Smoking status: Current Everyday Smoker -- 1.0 packs/day for 18 years    Types: Cigarettes  . Smokeless tobacco: Not on file  . Alcohol Use: Yes     4 qts/day      Review of Systems  Constitutional: Negative.   HENT: Negative.   Respiratory: Negative.   Cardiovascular: Negative.   Gastrointestinal: Negative.   Musculoskeletal: Negative.   Skin: Negative.   Neurological: Negative.   Hematological: Negative.   Psychiatric/Behavioral: Positive for suicidal ideas, dysphoric mood and agitation.    Allergies  Peanut-containing drug products  Home Medications  No current outpatient prescriptions on file.  BP 101/70  Pulse 77  Temp 98 F (36.7 C) (Oral)  Resp 16  SpO2 95%  Physical Exam  Nursing note and vitals reviewed. Constitutional: He appears well-developed and well-nourished.  HENT:  Head: Normocephalic and atraumatic.  Eyes: Conjunctivae are normal. Pupils are equal, round, and reactive to light.  Neck: Neck supple. No tracheal deviation present. No thyromegaly present.  Cardiovascular: Normal rate  and regular rhythm.   No murmur heard. Pulmonary/Chest: Effort normal and breath sounds normal.  Abdominal: Soft. Bowel sounds are normal. He exhibits no distension. There is no tenderness.  Musculoskeletal: Normal range of motion. He exhibits no edema and no tenderness.  Neurological: He is alert. Coordination normal.  Skin: Skin is warm and dry. No rash noted.  Psychiatric:       Depressed affect, tearful    ED Course  Procedures (including critical care time)  Labs Reviewed  CBC - Abnormal; Notable for the following:    RBC 3.89 (*)     Hemoglobin 11.6 (*)     HCT 34.3 (*)     All other components within normal limits  DIFFERENTIAL - Abnormal; Notable for the following:    Neutrophils Relative 42 (*)     Monocytes Relative 13 (*)     Basophils Relative 3 (*)     Basophils Absolute 0.2 (*)     All other components within normal limits  COMPREHENSIVE METABOLIC PANEL  ETHANOL  URINE RAPID DRUG SCREEN (HOSP PERFORMED)   No results found.   No diagnosis found.   Results for orders placed during the hospital encounter of 02/20/12  CBC      Component Value Range   WBC 7.1  4.0 - 10.5 K/uL   RBC 3.89 (*) 4.22 - 5.81 MIL/uL   Hemoglobin 11.6 (*) 13.0 - 17.0 g/dL   HCT 86.5 (*) 78.4 - 69.6 %   MCV 88.2  78.0 - 100.0 fL   MCH 29.8  26.0 - 34.0 pg  MCHC 33.8  30.0 - 36.0 g/dL   RDW 16.1  09.6 - 04.5 %   Platelets 168  150 - 400 K/uL  DIFFERENTIAL      Component Value Range   Neutrophils Relative 42 (*) 43 - 77 %   Neutro Abs 3.0  1.7 - 7.7 K/uL   Lymphocytes Relative 40  12 - 46 %   Lymphs Abs 2.9  0.7 - 4.0 K/uL   Monocytes Relative 13 (*) 3 - 12 %   Monocytes Absolute 0.9  0.1 - 1.0 K/uL   Eosinophils Relative 2  0 - 5 %   Eosinophils Absolute 0.1  0.0 - 0.7 K/uL   Basophils Relative 3 (*) 0 - 1 %   Basophils Absolute 0.2 (*) 0.0 - 0.1 K/uL   WBC Morphology ATYPICAL LYMPHOCYTES    COMPREHENSIVE METABOLIC PANEL      Component Value Range   Sodium 138  135 - 145  mEq/L   Potassium 3.5  3.5 - 5.1 mEq/L   Chloride 105  96 - 112 mEq/L   CO2 21  19 - 32 mEq/L   Glucose, Bld 92  70 - 99 mg/dL   BUN 12  6 - 23 mg/dL   Creatinine, Ser 4.09  0.50 - 1.35 mg/dL   Calcium 8.7  8.4 - 81.1 mg/dL   Total Protein 7.5  6.0 - 8.3 g/dL   Albumin 3.9  3.5 - 5.2 g/dL   AST 914 (*) 0 - 37 U/L   ALT 75 (*) 0 - 53 U/L   Alkaline Phosphatase 116  39 - 117 U/L   Total Bilirubin 0.3  0.3 - 1.2 mg/dL   GFR calc non Af Amer >90  >90 mL/min   GFR calc Af Amer >90  >90 mL/min  ETHANOL      Component Value Range   Alcohol, Ethyl (B) 257 (*) 0 - 11 mg/dL  URINE RAPID DRUG SCREEN (HOSP PERFORMED)      Component Value Range   Opiates NONE DETECTED  NONE DETECTED   Cocaine POSITIVE (*) NONE DETECTED   Benzodiazepines POSITIVE (*) NONE DETECTED   Amphetamines NONE DETECTED  NONE DETECTED   Tetrahydrocannabinol NONE DETECTED  NONE DETECTED   Barbiturates NONE DETECTED  NONE DETECTED   No results found.  MDM  Plan telemetry psychiatry consultation act team consultation. Patient likely requires inpatient stay for detox and psychiatric counseling. Patient voluntary present Diagnosis #1 polysubstance abuse #2 suicidal ideation        Doug Sou, MD 02/20/12 2010

## 2012-02-20 NOTE — Discharge Summary (Signed)
Physician Discharge Summary Note  Patient:  Mario Andrews is an 51 y.o., male MRN:  [id number] DOB:  03/19/1961 Patient phone:  [phone] (home)  Patient address:   [address] [place] [address],   Date of Admission:  02/13/2012 Date of Discharge: 02/16/2012  Reason for Admission: Alcohol detox  Discharge Diagnoses: Principal Problem:  *Alcohol dependence Discharge Diagnoses:  AXIS I: Substance Abuse  AXIS II: Deferred  AXIS III:  Past Medical History   Diagnosis  Date   .  Anxiety    .  Depression    .  COPD (chronic obstructive pulmonary disease)    .  Seizures      last seizure 2 months ago   AXIS IV: economic problems, educational problems, housing problems, problems related to social environment and problems with primary support group  AXIS V: 61-70 mild symptoms  Level of Care:  Outpatient.   Hospital Course: Kabeer presented asking for assistance with alcohol and drug detox. He had been using alcohol and cocaine in significant amounts prior to admission.  He was stabilized and admitted to [facility].  The Librium protocol was ordered but the patient declined to use the medication.  He initially slept most of the first 24 hours on the unit.  His participation in unit programming was minimal in that he did not attend groups.  He did not participate in discharge planning, and did not have any plan to continue his sobriety upon discharge.  He was offered residential treatment, outpatient treatment or long term residential.  Stelios had no strong desire to pursue any further treatment.  He elected to go to [facility] for his medication and will attend AA meetings once a week.  While his withdrawal symptoms had decreased significantly, he had limited insight. He requested discharge and a bus pass.  He was in full contact with reality, denied SI/HI, voiced no AH/VH.  He was discharged in stable and improved condition.  Consults: none  Significant Diagnostic Studies:   none  Discharge Vitals:   Blood pressure 110/78, pulse 71, temperature 96.5 F (35.8 C), temperature source Oral, resp. rate 20, height 6\' 1"  (1.854 m), weight 63.504 kg (140 lb).  Mental Status Exam: See Mental Status Examination and Suicide Risk Assessment completed by Attending Physician prior to discharge.  Discharge destination:  home  Is patient on multiple antipsychotic therapies at discharge:  no   Has Patient had three or more failed trials of antipsychotic monotherapy by history:  no  Recommended Plan for Multiple Antipsychotic Therapies:  NA    Med List    Warning       Cannot display patient medications because the patient has not yet arrived.            Follow-up Information    Follow up with [facility] in 90 days.  Get a sponser.         Follow-up recommendations:    Comments:   Signed: Tamala Julian [facility] For Dr. Curlene Labrum. Readling 02/20/2012, 8:22 PM

## 2012-02-21 ENCOUNTER — Encounter (HOSPITAL_COMMUNITY): Payer: Self-pay | Admitting: *Deleted

## 2012-02-21 ENCOUNTER — Inpatient Hospital Stay (HOSPITAL_COMMUNITY)
Admission: EM | Admit: 2012-02-21 | Discharge: 2012-02-23 | DRG: 897 | Disposition: A | Discharge status: Home / Self Care | Payer: Federal, State, Local not specified - Other | Source: Ambulatory Visit | Attending: Psychiatry | Admitting: Psychiatry

## 2012-02-21 DIAGNOSIS — J449 Chronic obstructive pulmonary disease, unspecified: Secondary | ICD-10-CM | POA: Diagnosis present

## 2012-02-21 DIAGNOSIS — F1020 Alcohol dependence, uncomplicated: Secondary | ICD-10-CM | POA: Diagnosis present

## 2012-02-21 DIAGNOSIS — F1410 Cocaine abuse, uncomplicated: Secondary | ICD-10-CM | POA: Diagnosis present

## 2012-02-21 DIAGNOSIS — Z8673 Personal history of transient ischemic attack (TIA), and cerebral infarction without residual deficits: Secondary | ICD-10-CM

## 2012-02-21 DIAGNOSIS — J4489 Other specified chronic obstructive pulmonary disease: Secondary | ICD-10-CM | POA: Diagnosis present

## 2012-02-21 DIAGNOSIS — F1010 Alcohol abuse, uncomplicated: Secondary | ICD-10-CM | POA: Diagnosis present

## 2012-02-21 DIAGNOSIS — F10988 Alcohol use, unspecified with other alcohol-induced disorder: Principal | ICD-10-CM | POA: Diagnosis present

## 2012-02-21 MED ORDER — CARBAMAZEPINE 200 MG PO TABS
200.0000 mg | ORAL_TABLET | Freq: Three times a day (TID) | ORAL | Status: DC
  Administered 2012-02-21 – 2012-02-23 (×7): 200 mg via ORAL
  Filled 2012-02-21 (×13): qty 1

## 2012-02-21 MED ORDER — FERROUS SULFATE 325 (65 FE) MG PO TABS
325.0000 mg | ORAL_TABLET | Freq: Two times a day (BID) | ORAL | Status: DC
  Administered 2012-02-21 – 2012-02-23 (×4): 325 mg via ORAL
  Filled 2012-02-21 (×10): qty 1

## 2012-02-21 MED ORDER — HYDROXYZINE HCL 25 MG PO TABS: 25.0000 mg | ORAL_TABLET | Freq: Four times a day (QID) | ORAL | Status: DC | PRN

## 2012-02-21 MED ORDER — TRAZODONE HCL 50 MG PO TABS
50.0000 mg | ORAL_TABLET | Freq: Every evening | ORAL | Status: DC | PRN
  Administered 2012-02-21 – 2012-02-22 (×2): 50 mg via ORAL
  Filled 2012-02-21 (×2): qty 1

## 2012-02-21 MED ORDER — ADULT MULTIVITAMIN W/MINERALS CH
1.0000 | ORAL_TABLET | Freq: Every day | ORAL | Status: DC
  Administered 2012-02-21 – 2012-02-23 (×3): 1 via ORAL
  Filled 2012-02-21 (×5): qty 1

## 2012-02-21 MED ORDER — CHLORDIAZEPOXIDE HCL 25 MG PO CAPS
25.0000 mg | ORAL_CAPSULE | Freq: Four times a day (QID) | ORAL | Status: DC | PRN
  Filled 2012-02-21: qty 1

## 2012-02-21 MED ORDER — MAGNESIUM HYDROXIDE 400 MG/5ML PO SUSP: 30.0000 mL | Freq: Every day | ORAL | Status: DC | PRN

## 2012-02-21 MED ORDER — NICOTINE 21 MG/24HR TD PT24
21.0000 mg | MEDICATED_PATCH | Freq: Every day | TRANSDERMAL | Status: DC
  Administered 2012-02-21 – 2012-02-23 (×3): 21 mg via TRANSDERMAL
  Filled 2012-02-21 (×6): qty 1

## 2012-02-21 MED ORDER — VITAMIN B-1 100 MG PO TABS
100.0000 mg | ORAL_TABLET | Freq: Every day | ORAL | Status: DC
  Administered 2012-02-22 – 2012-02-23 (×2): 100 mg via ORAL
  Filled 2012-02-21 (×4): qty 1

## 2012-02-21 MED ORDER — LOPERAMIDE HCL 2 MG PO CAPS: 2.0000 mg | ORAL_CAPSULE | ORAL | Status: DC | PRN

## 2012-02-21 MED ORDER — ALUM & MAG HYDROXIDE-SIMETH 200-200-20 MG/5ML PO SUSP: 30.0000 mL | ORAL | Status: DC | PRN

## 2012-02-21 MED ORDER — ACETAMINOPHEN 325 MG PO TABS: 650.0000 mg | ORAL_TABLET | Freq: Four times a day (QID) | ORAL | Status: DC | PRN

## 2012-02-21 MED ORDER — CHLORDIAZEPOXIDE HCL 25 MG PO CAPS
25.0000 mg | ORAL_CAPSULE | Freq: Once | ORAL | Status: AC
  Administered 2012-02-21: 25 mg via ORAL
  Filled 2012-02-21: qty 1

## 2012-02-21 MED ORDER — THIAMINE HCL 100 MG/ML IJ SOLN
100.0000 mg | Freq: Once | INTRAMUSCULAR | Status: AC
  Administered 2012-02-21: 100 mg via INTRAMUSCULAR

## 2012-02-21 MED ORDER — ONDANSETRON 4 MG PO TBDP: 4.0000 mg | ORAL_TABLET | Freq: Four times a day (QID) | ORAL | Status: DC | PRN

## 2012-02-21 NOTE — Progress Notes (Signed)
02/21/2012         Time: 1415      Group Topic/Focus: The focus of this group is on enhancing patients' problem solving skills, which involves identifying the problem, brainstorming solutions and choosing and trying a solution.    Participation Level: Active  Participation Quality: Attentive  Affect: Appropriate  Cognitive: Oriented  Additional Comments: Patient well engaged, reports everything is better now that he is back at the hospital.    Faustine Tates 02/21/2012 3:27 PM

## 2012-02-21 NOTE — H&P (Signed)
Medical/psychiatric screening examination/treatment/procedure(s) were performed by non-physician practitioner and as supervising physician I was immediately available for consultation/collaboration.  I have seen and examined this patient and agree the major elements of this evaluation.  

## 2012-02-21 NOTE — H&P (Signed)
Psychiatric Admission Assessment Adult  Patient Identification:  Mario Andrews  Date of Evaluation:  02/21/2012  Chief Complaint:  mood disorder  History of Present Illness: This is a 51 year old caucasian male, admitted from the [facility] ED with reports of alcohol dependence, requesting detoxification. Patient recently received alcohol detoxification within 4 days of hospital stay and was discharged on 02/16/12. Patient reports, "I was a patient here last week. I stayed for 4 days, got detoxified off of alcohol and crack cocaine then discharged last Friday. As soon as I got home, 4 hours later, I started drinking again.  That continued day after day till my sister got fed up and ordered me to get back here. My friend actually took me to [facility] instead, but I ended up in this one any way. I drink about 3-4 bottles of the 40oz daily. I came from an alcoholic family. My brother is an alcoholic. He is not doing too well right now. Alcohol has messed with his system, his organs and everything else is weak. This time, I did not use drugs. I drink to keep me relaxed and also to stop from shaking too. That is what happens to me once I wake up in the morning. I will start shaking real bad that it interfers with everything else that I got to do. I will drink to stop me from shaking. I'm determined to do better this time around"   ROS: Negative with the exception of the HPI. Denies SOB and or chest pains. PE: Completed by MD in ED and I agree with those findings.   Mood Symptoms:  Hopelessness, Sadness, Depression Symptoms:  depressed mood, feelings of worthlessness/guilt, (Hypo) Manic Symptoms:  Irritable Mood, Anxiety Symptoms:  Excessive Worry, Psychotic Symptoms:  Hallucinations: Auditory Visual sometimes.  PTSD Symptoms: Had a traumatic exposure:  Patient denies any traumatic events past or present.  Past Psychiatric History: Diagnosis: Alcohol dependence    Hospitalizations: [facility] x 2  Outpatient Care: [facility]  Substance Abuse Care: [facility] in 2010  Self-Mutilation: Denies  Suicidal Attempts: Denies attempts and or thoughts.  Violent Behaviors: Denies   Past Medical History:   Past Medical History  Diagnosis Date  . Anxiety   . Depression   . COPD (chronic obstructive pulmonary disease)   . Seizures     last seizure 2 months ago    Allergies:   Allergies  Allergen Reactions  . Peanut-Containing Drug Products Hives    PTA Medications: Prescriptions prior to admission  Medication Sig Dispense Refill  . traZODone (DESYREL) 50 MG tablet Take 100 mg by mouth at bedtime.          Substance Abuse History in the last 12 months: Substance Age of 1st Use Last Use Amount Specific Type  Nicotine  20 Prior to hosp 1 pack daily Cigarettes  Alcohol 24 Prior to hosp 3 bottles of the 40 oz daily Liquor  Cannabis Denies use     Opiates Denies use     Cocaine Denies use, however, UDS was (+).     Methamphetamines Denies use     LSD Denies use     Ecstasy Denies use     Benzodiazepines Denies use, But UDS (+).     Caffeine      Inhalants      Others:                         Consequences  of Substance Abuse: Medical Consequences:  Liver damage, Tremors, headaches, diarrhea, muscle cramps. Legal Consequences:  Arrests, jail time, loss of driving privilege. Family Consequences:  Family discord  Social History: Current Place of Residence:  [place]  Place of Birth: [place]  Family Members: "My sister"  Marital Status:  Single  Children: 0  Sons:0  Daughters: 0  Relationships: "I am single"  Education:  "I stopped at 9th grade"  Educational Problems/Performance: "I did not complete high school"  Religious Beliefs/Practices: None reported  History of Abuse (Emotional/Phsycial/Sexual): Denies any form of abuse  Occupational Experiences: Employed  Hotel manager History:  None.  Legal History: None  reported  Hobbies/Interests: "I like to drink alcohol"  Family History:  No family history on file.  Mental Status Examination/Evaluation: Objective:  Appearance: Disheveled  Eye Contact::  Good  Speech:  Clear and Coherent  Volume:  Normal  Mood:  Euthymic  Affect:  Appropriate  Thought Process:  Coherent  Orientation:  Full  Thought Content:  Rumination  Suicidal Thoughts:  No  Homicidal Thoughts:  No  Memory:  Immediate;   Good Recent;   Good Remote;   Good  Judgement:  Poor  Insight:  Lacking  Psychomotor Activity:  Tremor and Nervousness  Concentration:  Good  Recall:  Good  Akathisia:  No  Handed:  Right  AIMS (if indicated):     Assets:  Desire for Improvement  Sleep:  Number of Hours: 0     Laboratory/X-Ray: None Psychological Evaluation(s)      Assessment:    AXIS I:  Alcohol dependence AXIS II:  Deferred AXIS III:   Past Medical History  Diagnosis Date  . Anxiety   . Depression   . COPD (chronic obstructive pulmonary disease)   . Seizures     last seizure 2 months ago   AXIS IV:  other psychosocial or environmental problems and substance abuse problems AXIS V:  21-30 behavior considerably influenced by delusions or hallucinations OR serious impairment in judgment, communication OR inability to function in almost all areas  Treatment Plan/Recommendations: Admit for safety, detox and stabilization. Review and reinstate any pertinent home medications for safety. Initiate Librium protocol for alcohol and Benzo detox. Start Ferous sulfate 325 mg bid for low hemoglobin.  Treatment Plan Summary: Daily contact with patient to assess and evaluate symptoms and progress in treatment Medication management  Current Medications:  Current Facility-Administered Medications  Medication Dose Route Frequency Provider Last Rate Last Dose  . acetaminophen (TYLENOL) tablet 650 mg  650 mg Oral Q6H PRN Wonda Cerise, MD      . alum & mag hydroxide-simeth  (MAALOX/MYLANTA) 200-200-20 MG/5ML suspension 30 mL  30 mL Oral Q4H PRN Wonda Cerise, MD      . carbamazepine (TEGRETOL) tablet 200 mg  200 mg Oral TID Mike Craze, MD   200 mg at 02/21/12 1146  . chlordiazePOXIDE (LIBRIUM) capsule 25 mg  25 mg Oral Q6H PRN Mike Craze, MD      . chlordiazePOXIDE (LIBRIUM) capsule 25 mg  25 mg Oral Once Mike Craze, MD   25 mg at 02/21/12 1146  . hydrOXYzine (ATARAX/VISTARIL) tablet 25 mg  25 mg Oral Q6H PRN Mike Craze, MD      . loperamide (IMODIUM) capsule 2-4 mg  2-4 mg Oral PRN Mike Craze, MD      . magnesium hydroxide (MILK OF MAGNESIA) suspension 30 mL  30 mL Oral Daily PRN Wonda Cerise, MD      .  multivitamin with minerals tablet 1 tablet  1 tablet Oral Daily Mike Craze, MD   1 tablet at 02/21/12 1146  . nicotine (NICODERM CQ - dosed in mg/24 hours) patch 21 mg  21 mg Transdermal Daily Mike Craze, MD   21 mg at 02/21/12 0811  . ondansetron (ZOFRAN-ODT) disintegrating tablet 4 mg  4 mg Oral Q6H PRN Mike Craze, MD      . thiamine (B-1) injection 100 mg  100 mg Intramuscular Once Mike Craze, MD   100 mg at 02/21/12 1149  . thiamine (VITAMIN B-1) tablet 100 mg  100 mg Oral Daily Mike Craze, MD      . traZODone (DESYREL) tablet 50 mg  50 mg Oral QHS PRN Wonda Cerise, MD       Facility-Administered Medications Ordered in Other Encounters  Medication Dose Route Frequency Provider Last Rate Last Dose  . DISCONTD: acetaminophen (TYLENOL) tablet 650 mg  650 mg Oral Q4H PRN Doug Sou, MD      . DISCONTD: alum & mag hydroxide-simeth (MAALOX/MYLANTA) 200-200-20 MG/5ML suspension 30 mL  30 mL Oral PRN Doug Sou, MD      . DISCONTD: folic acid (FOLVITE) tablet 1 mg  1 mg Oral Daily Doug Sou, MD   1 mg at 02/20/12 2319  . DISCONTD: LORazepam (ATIVAN) injection 1 mg  1 mg Intravenous Q6H PRN Doug Sou, MD      . DISCONTD: LORazepam (ATIVAN) tablet 1 mg  1 mg Oral Q8H PRN Doug Sou, MD   1 mg at 02/20/12 2319  .  DISCONTD: LORazepam (ATIVAN) tablet 1 mg  1 mg Oral Q6H PRN Doug Sou, MD      . DISCONTD: multivitamin with minerals tablet 1 tablet  1 tablet Oral Daily Doug Sou, MD   1 tablet at 02/20/12 2319  . DISCONTD: nicotine (NICODERM CQ - dosed in mg/24 hours) patch 21 mg  21 mg Transdermal Daily Doug Sou, MD      . DISCONTD: thiamine (B-1) injection 100 mg  100 mg Intravenous Daily Doug Sou, MD      . DISCONTD: thiamine (VITAMIN B-1) tablet 100 mg  100 mg Oral Daily Doug Sou, MD   100 mg at 02/20/12 2319  . DISCONTD: zolpidem (AMBIEN) tablet 5 mg  5 mg Oral QHS PRN Doug Sou, MD        Observation Level/Precautions:  Q 15 minutes checks for safety  Laboratory:  ED lab findings: low Hgb 11.6, Etoh levels 257, (+) Coccaine, & benzos.  Psychotherapy:  Group  Medications:  See medication lists  Routine PRN Medications:  Yes  Consultations:  None indicated at this time  Discharge Concerns:  Safety and sobriety  Other:     Armandina Stammer I 6/26/20133:19 PM

## 2012-02-21 NOTE — Progress Notes (Signed)
[facility] MD Progress Note  02/21/2012 9:56 AM  Diagnosis:   Axis I: Alcohol Abuse Axis II: Deferred Axis III:  Past Medical History  Diagnosis Date  . Anxiety   . Depression   . COPD (chronic obstructive pulmonary disease)   . Seizures     last seizure 2 months ago    ADL's:  Intact  Sleep: Poor  Appetite:  Good  Suicidal Ideation:  Pt denies any thoughts, plans, intent of suicide Homicidal Ideation:  Pt denies any thoughts, plans, intent of homicide  Mental Status Examination/Evaluation: Objective:  Appearance: Casual  Eye Contact::  Good  Speech:  Clear and Coherent  Volume:  Normal  Mood:  Anxious and Depressed  Affect:  Congruent  Thought Process:  Coherent  Orientation:  Full  Thought Content:  WDL  Suicidal Thoughts:  No  Homicidal Thoughts:  No  Memory:  Immediate;   Fair  Judgement:  Impaired  Insight:  Lacking  Psychomotor Activity:  Normal  Concentration:  Fair  Recall:  Fair  Akathisia:  No  Handed:  Right  AIMS (if indicated):     Assets:  Communication Skills Desire for Improvement  Sleep:  Number of Hours: 0    Vital Signs:Blood pressure 119/78, pulse 75, resp. rate 18, height 6\' 1"  (1.854 m), weight 64.864 kg (143 lb). Current Medications: Current Facility-Administered Medications  Medication Dose Route Frequency Provider Last Rate Last Dose  . acetaminophen (TYLENOL) tablet 650 mg  650 mg Oral Q6H PRN Wonda Cerise, MD      . alum & mag hydroxide-simeth (MAALOX/MYLANTA) 200-200-20 MG/5ML suspension 30 mL  30 mL Oral Q4H PRN Wonda Cerise, MD      . carbamazepine (TEGRETOL) tablet 200 mg  200 mg Oral TID Mike Craze, MD      . chlordiazePOXIDE (LIBRIUM) capsule 25 mg  25 mg Oral Q6H PRN Mike Craze, MD      . chlordiazePOXIDE (LIBRIUM) capsule 25 mg  25 mg Oral Once Mike Craze, MD      . hydrOXYzine (ATARAX/VISTARIL) tablet 25 mg  25 mg Oral Q6H PRN Mike Craze, MD      . loperamide (IMODIUM) capsule 2-4 mg  2-4 mg Oral PRN Mike Craze, MD      . magnesium hydroxide (MILK OF MAGNESIA) suspension 30 mL  30 mL Oral Daily PRN Wonda Cerise, MD      . multivitamin with minerals tablet 1 tablet  1 tablet Oral Daily Mike Craze, MD      . nicotine (NICODERM CQ - dosed in mg/24 hours) patch 21 mg  21 mg Transdermal Daily Mike Craze, MD   21 mg at 02/21/12 0811  . ondansetron (ZOFRAN-ODT) disintegrating tablet 4 mg  4 mg Oral Q6H PRN Mike Craze, MD      . thiamine (B-1) injection 100 mg  100 mg Intramuscular Once Mike Craze, MD      . thiamine (VITAMIN B-1) tablet 100 mg  100 mg Oral Daily Mike Craze, MD      . traZODone (DESYREL) tablet 50 mg  50 mg Oral QHS PRN Wonda Cerise, MD       Facility-Administered Medications Ordered in Other Encounters  Medication Dose Route Frequency Provider Last Rate Last Dose  . DISCONTD: acetaminophen (TYLENOL) tablet 650 mg  650 mg Oral Q4H PRN Doug Sou, MD      . DISCONTD: alum & mag hydroxide-simeth (MAALOX/MYLANTA) 200-200-20 MG/5ML suspension 30 mL  30 mL Oral PRN Doug Sou, MD      . DISCONTD: folic acid (FOLVITE) tablet 1 mg  1 mg Oral Daily Doug Sou, MD   1 mg at 02/20/12 2319  . DISCONTD: LORazepam (ATIVAN) injection 1 mg  1 mg Intravenous Q6H PRN Doug Sou, MD      . DISCONTD: LORazepam (ATIVAN) tablet 1 mg  1 mg Oral Q8H PRN Doug Sou, MD   1 mg at 02/20/12 2319  . DISCONTD: LORazepam (ATIVAN) tablet 1 mg  1 mg Oral Q6H PRN Doug Sou, MD      . DISCONTD: multivitamin with minerals tablet 1 tablet  1 tablet Oral Daily Doug Sou, MD   1 tablet at 02/20/12 2319  . DISCONTD: nicotine (NICODERM CQ - dosed in mg/24 hours) patch 21 mg  21 mg Transdermal Daily Doug Sou, MD      . DISCONTD: thiamine (B-1) injection 100 mg  100 mg Intravenous Daily Doug Sou, MD      . DISCONTD: thiamine (VITAMIN B-1) tablet 100 mg  100 mg Oral Daily Doug Sou, MD   100 mg at 02/20/12 2319  . DISCONTD: zolpidem (AMBIEN) tablet 5 mg  5 mg Oral  QHS PRN Doug Sou, MD        Lab Results:  Results for orders placed during the hospital encounter of 02/20/12 (from the past 48 hour(s))  CBC     Status: Abnormal   Collection Time   02/20/12  6:20 PM      Component Value Range Comment   WBC 7.1  4.0 - 10.5 K/uL    RBC 3.89 (*) 4.22 - 5.81 MIL/uL    Hemoglobin 11.6 (*) 13.0 - 17.0 g/dL    HCT 40.9 (*) 81.1 - 52.0 %    MCV 88.2  78.0 - 100.0 fL    MCH 29.8  26.0 - 34.0 pg    MCHC 33.8  30.0 - 36.0 g/dL    RDW 91.4  78.2 - 95.6 %    Platelets 168  150 - 400 K/uL   DIFFERENTIAL     Status: Abnormal   Collection Time   02/20/12  6:20 PM      Component Value Range Comment   Neutrophils Relative 42 (*) 43 - 77 %    Neutro Abs 3.0  1.7 - 7.7 K/uL    Lymphocytes Relative 40  12 - 46 %    Lymphs Abs 2.9  0.7 - 4.0 K/uL    Monocytes Relative 13 (*) 3 - 12 %    Monocytes Absolute 0.9  0.1 - 1.0 K/uL    Eosinophils Relative 2  0 - 5 %    Eosinophils Absolute 0.1  0.0 - 0.7 K/uL    Basophils Relative 3 (*) 0 - 1 %    Basophils Absolute 0.2 (*) 0.0 - 0.1 K/uL    WBC Morphology ATYPICAL LYMPHOCYTES     COMPREHENSIVE METABOLIC PANEL     Status: Abnormal   Collection Time   02/20/12  6:20 PM      Component Value Range Comment   Sodium 138  135 - 145 mEq/L    Potassium 3.5  3.5 - 5.1 mEq/L    Chloride 105  96 - 112 mEq/L    CO2 21  19 - 32 mEq/L    Glucose, Bld 92  70 - 99 mg/dL    BUN 12  6 - 23 mg/dL    Creatinine, Ser 2.13  0.50 - 1.35  mg/dL    Calcium 8.7  8.4 - 78.2 mg/dL    Total Protein 7.5  6.0 - 8.3 g/dL    Albumin 3.9  3.5 - 5.2 g/dL    AST 956 (*) 0 - 37 U/L    ALT 75 (*) 0 - 53 U/L    Alkaline Phosphatase 116  39 - 117 U/L    Total Bilirubin 0.3  0.3 - 1.2 mg/dL    GFR calc non Af Amer >90  >90 mL/min    GFR calc Af Amer >90  >90 mL/min   ETHANOL     Status: Abnormal   Collection Time   02/20/12  6:20 PM      Component Value Range Comment   Alcohol, Ethyl (B) 257 (*) 0 - 11 mg/dL   URINE RAPID DRUG SCREEN (HOSP  PERFORMED)     Status: Abnormal   Collection Time   02/20/12  6:26 PM      Component Value Range Comment   Opiates NONE DETECTED  NONE DETECTED    Cocaine POSITIVE (*) NONE DETECTED    Benzodiazepines POSITIVE (*) NONE DETECTED    Amphetamines NONE DETECTED  NONE DETECTED    Tetrahydrocannabinol NONE DETECTED  NONE DETECTED    Barbiturates NONE DETECTED  NONE DETECTED     Physical Findings: AIMS:  , ,  ,  ,    CIWA:    COWS:     Treatment Plan Summary: Daily contact with patient to assess and evaluate symptoms and progress in treatment Medication management  Plan: Admit, start Tegretol for detox, to prevent withdrawal seizures, and to treat anxiety.  Discussed the risks, benefits, and probable clinical course with and without treatment.  Pt is agreeable to the current course of treatment.  Syeda Prickett 02/21/2012, 9:56 AM

## 2012-02-21 NOTE — Progress Notes (Signed)
Patient ID: Mario Andrews, male   DOB: Jan 25, 1961, 51 y.o.   MRN: [id number]  D: Patient pleasant on approach this am. States that he has been depressed recently and he relapsed on crack/cocaine and ETOH. States that he is really serious this time about getting clean and getting his life together. Currently homeless and seeking long term treatment. Patient states anxious and having some tremors.  A: Staff will monitor and encourage group attendance. R: Patient got nicotine patch and is awaiting orders from physician.

## 2012-02-21 NOTE — Tx Team (Signed)
Interdisciplinary Treatment Plan Update (Adult)  Date:  02/21/2012  Time Reviewed:  10:17 AM   Progress in Treatment: Attending groups:   Yes   Participating in groups:  Yes Taking medication as prescribed:  Yes Tolerating medication:  Yes Family/Significant othe contact made:  Patient understands diagnosis:  Yes Discussing patient identified problems/goals with staff: Yes Medical problems stabilized or resolved: Yes Denies suicidal/homicidal ideation:Yes Issues/concerns per patient self-inventory:  Other:  New problem(s) identified:  Reason for Continuation of Hospitalization: Anxiety Depression Medication stabilization  Interventions implemented related to continuation of hospitalization:  Medication Management; safety checks q 15 mins  Additional comments:  Estimated length of stay: 2-4 days  Discharge Plan:  Requesting referral to [facility]  New goal(s):  Review of initial/current patient goals per problem list:    1.  Goal(s): Eliminate SI/other thoughts of self harm   Met:  Yes  Target date: d/c  As evidenced by: Patient longer endorses SI/other thought self harm    2.  Goal (s): Reduce depression/anxiety (currently rates depression at eight and anxiety at six)   Met:  No  Target date: d/c  As evidenced by: Patient currently rating symptoms at four or below    3.  Goal(s): .stabilize on meds   Met:  No  Target date: d/c  As evidenced by: Patient will report being stable on medications - symptoms have decreased    4.  Goal(s): Referral for Residential Treatment  Met:  No  Target date: d/c  As evidenced by:  Referral will have been made to [facility]  Attendees: Patient:     Nursing:  Manuela Schwartz 02/21/2012 10:18 AM  Physician:  Orson Aloe, MD 02/21/2012 10:17 AM   Nursing:   Carolynn Comment, RN 02/21/2012 10:17 AM   CaseManager:  Juline Patch, LCSW 02/21/2012 10:17 AM   Counselor:  Angus Palms, LCSW 02/21/2012 10:17 AM     Other:  Reyes Ivan, LCSWA

## 2012-02-21 NOTE — Progress Notes (Signed)
Patient seen during during d/c planning group and or treatment team.  He reports discharging from the hospital on Friday, February 16, 2012 and relapsing on drugs within hours of discharge.  He currently endorses using $60-80 cocaine daily.  He drinks four to five quarts of malt liquor daily.  Patient is interested in discharging to [facility].  Referral to be made when patient ready for discharge.  Patient is not endorsing SI/HI.  He rates depression at eight, anxiety at six, hopelessness at ten and helplessness at seven.

## 2012-02-21 NOTE — Progress Notes (Signed)
Adult Psychosocial Assessment Update Interdisciplinary Team  [facility] admissions/discharges:  Admissions Discharges  Date:  02/14/2012 Date:   02/16/2012  Date: Date:  Date: Date:  Date: Date:  Date: Date:   Changes since the last Psychosocial Assessment (including adherence to outpatient mental health and/or substance abuse treatment, situational issues contributing to decompensation and/or relapse). Zylon reports he went back to using alcohol and crack the day he was discharged from  [facility] after a 3 hour period of sobriety. Is not sure why he went back to using, other than  Experiencing some anxiety. States he did not stay at [facility] long enough last time and   Wants to stay longer this time. Did not follow through with medication or with follow-up  Appointments after last discharge. Kabeer reports that he is now ready to make a change  And stop using. However, prior to coming in he was wanting to "end everything" by cutting his wrists.   Discharge Plan 1. Will you be returning to the same living situation after discharge?   Yes:   X No:      If no, what is your plan?           2. Would you like a referral for services when you are discharged? Yes:  X    If yes, for what services?  No:       Therapist and psychiatrist. Not sure at this time if he wants rehab.       Summary and Recommendations (to be completed by the evaluator) Huckleberry is a 51 year old single male diagnosed with Mood Disorder NOS and Substance  Abuse. He reports that he did not follow up with any recommendations from his last   Hospitalization, and reports that he relapsed 3 hours after discharge because he was   Discharged too soon. He did not continue taking his medications or follow up with a  Psychiatrist of therapist and is not sure if he wants longer term treatment this time, but  Does stated he was not ready to stop using last week and is now. Daril would benefit  From crisis  stabilization, medication evaluation, therapy groups for processing thoughts/  Feelings/experiences, psychoed groups for coping skills and case management for   Discharge planning.        Signature:  Billie Lade, 02/21/2012 8:46 AM

## 2012-02-21 NOTE — Progress Notes (Signed)
51 yo male who presents voluntarily and in no acute distress for the treatment of SI, Depression and Polysubstance abuse (crack/ETOH). Appears flat and depressed. Calm and cooperative with admission process. States he was here last week and was D/C'd on Friday. States he had a 3 hour period of sobriety and then went back to consuming ETOH and smoking Crack. States he just felt too anxious. States he didn't want to do it but he did. Since Friday he has consumed ETOH daily and Crack as much as he could on a daily basis. States he ultimately felt like he was D/C'd too quickly and he feels like he needs to be here longer this time. Was not compliant with Rx as he felt like the medication made him nauseated. Medical history of COPD, current smoker and seizures. Skin assessed and found to be clear apart from a tattoo to right shoulder and an old, well healed laceration to back of the left knee. POC and unit policies explained and understanding verbalized. Consents obtained. Food and fluids offered and fluids accepted. Offered no additional questions or concerns. Escorted to unit and oriented by [place], MHT. Emergency Contact: Larry(neighbor/friend) [phone]
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
## 2012-02-21 NOTE — Tx Team (Signed)
Initial Interdisciplinary Treatment Plan  PATIENT STRENGTHS: (choose at least two) Ability for insight Active sense of humor Average or above average intelligence Capable of independent living Communication skills General fund of knowledge Motivation for treatment/growth Physical Health Supportive family/friends Work skills  PATIENT STRESSORS: Financial difficulties Medication change or noncompliance Occupational concerns Substance abuse   PROBLEM LIST: Problem List/Patient Goals Date to be addressed Date deferred Reason deferred Estimated date of resolution  Substance abuse      Depression      SI                                           DISCHARGE CRITERIA:  Ability to meet basic life and health needs Adequate post-discharge living arrangements Improved stabilization in mood, thinking, and/or behavior Motivation to continue treatment in a less acute level of care Need for constant or close observation no longer present Reduction of life-threatening or endangering symptoms to within safe limits Safe-care adequate arrangements made Verbal commitment to aftercare and medication compliance Withdrawal symptoms are absent or subacute and managed without 24-hour nursing intervention  PRELIMINARY DISCHARGE PLAN: Attend 12-step recovery group Outpatient therapy  PATIENT/FAMIILY INVOLVEMENT: This treatment plan has been presented to and reviewed with the patient, Mario Andrews.  The patient has been given the opportunity to ask questions and make suggestions.  Arturo Morton 02/21/2012, 4:44 AM

## 2012-02-21 NOTE — Progress Notes (Signed)
D: Patient cooperative and pleasant.  Patient states he does have some depression. Patient states he is doing well.  Patient verbalized to writer that he wanted to come back to [facility]. Patient states, "As soon as I left last Friday I left here and starting drinking."  Patient states "I am going to move from where I was staying because if you look out the window you will see people doing drugs." Patient active within the milieu. Patient interacted within group and spoke about leaving [facility] last week and coming back to [facility] the next week.  Patient denies SI/HI and denies AVH. A:Patient offered encouragement and support. Patient given prn medication for sleep. Staff to monitor Q 15 min for safety. R:Patient cooperative and pleasant. Patient remains safe.

## 2012-02-21 NOTE — [facility] Suicide Risk Assessment (Signed)
Suicide Risk Assessment  Admission Assessment     Demographic factors:  Assessment Details Time of Assessment: Admission Information Obtained From: Patient Current Mental Status:  Current Mental Status:  (currently denies SI) Loss Factors:  Loss Factors: Decrease in vocational status Historical Factors:    Risk Reduction Factors:  Risk Reduction Factors: Sense of responsibility to family;Positive social support  CLINICAL FACTORS:   Severe Anxiety and/or Agitation Alcohol/Substance Abuse/Dependencies Previous Psychiatric Diagnoses and Treatments  COGNITIVE FEATURES THAT CONTRIBUTE TO RISK:  Closed-mindedness Thought constriction (tunnel vision)    SUICIDE RISK:   Moderate:  Frequent suicidal ideation with limited intensity, and duration, some specificity in terms of plans, no associated intent, good self-control, limited dysphoria/symptomatology, some risk factors present, and identifiable protective factors, including available and accessible social support.  Reason for hospitalization: .Alcohol detox Diagnosis:   Axis I: Alcohol Abuse Axis II: Deferred Axis III:  Past Medical History  Diagnosis Date  . Anxiety   . Depression   . COPD (chronic obstructive pulmonary disease)   . Seizures     last seizure 2 months ago    ADL's:  Intact  Sleep: Poor  Appetite:  Good  Suicidal Ideation:  Pt denies any thoughts, plans, intent of suicide Homicidal Ideation:  Pt denies any thoughts, plans, intent of homicide  Mental Status Examination/Evaluation: Objective:  Appearance: Casual  Eye Contact::  Good  Speech:  Clear and Coherent  Volume:  Normal  Mood:  Anxious and Depressed  Affect:  Congruent  Thought Process:  Coherent  Orientation:  Full  Thought Content:  WDL  Suicidal Thoughts:  No  Homicidal Thoughts:  No  Memory:  Immediate;   Fair  Judgement:  Impaired  Insight:  Lacking  Psychomotor Activity:  Normal  Concentration:  Fair  Recall:  Fair  Akathisia:   No  Handed:  Right  AIMS (if indicated):     Assets:  Communication Skills Desire for Improvement  Sleep:  Number of Hours: 0    Vital Signs:Blood pressure 119/78, pulse 75, resp. rate 18, height 6\' 1"  (1.854 m), weight 64.864 kg (143 lb). Current Medications: Current Facility-Administered Medications  Medication Dose Route Frequency Provider Last Rate Last Dose  . acetaminophen (TYLENOL) tablet 650 mg  650 mg Oral Q6H PRN Wonda Cerise, MD      . alum & mag hydroxide-simeth (MAALOX/MYLANTA) 200-200-20 MG/5ML suspension 30 mL  30 mL Oral Q4H PRN Wonda Cerise, MD      . carbamazepine (TEGRETOL) tablet 200 mg  200 mg Oral TID Mike Craze, MD      . chlordiazePOXIDE (LIBRIUM) capsule 25 mg  25 mg Oral Q6H PRN Mike Craze, MD      . chlordiazePOXIDE (LIBRIUM) capsule 25 mg  25 mg Oral Once Mike Craze, MD      . hydrOXYzine (ATARAX/VISTARIL) tablet 25 mg  25 mg Oral Q6H PRN Mike Craze, MD      . loperamide (IMODIUM) capsule 2-4 mg  2-4 mg Oral PRN Mike Craze, MD      . magnesium hydroxide (MILK OF MAGNESIA) suspension 30 mL  30 mL Oral Daily PRN Wonda Cerise, MD      . multivitamin with minerals tablet 1 tablet  1 tablet Oral Daily Mike Craze, MD      . nicotine (NICODERM CQ - dosed in mg/24 hours) patch 21 mg  21 mg Transdermal Daily Mike Craze, MD   21 mg at 02/21/12 0811  . ondansetron (  ZOFRAN-ODT) disintegrating tablet 4 mg  4 mg Oral Q6H PRN Mike Craze, MD      . thiamine (B-1) injection 100 mg  100 mg Intramuscular Once Mike Craze, MD      . thiamine (VITAMIN B-1) tablet 100 mg  100 mg Oral Daily Mike Craze, MD      . traZODone (DESYREL) tablet 50 mg  50 mg Oral QHS PRN Wonda Cerise, MD       Facility-Administered Medications Ordered in Other Encounters  Medication Dose Route Frequency Provider Last Rate Last Dose  . DISCONTD: acetaminophen (TYLENOL) tablet 650 mg  650 mg Oral Q4H PRN Doug Sou, MD      . DISCONTD: alum & mag hydroxide-simeth  (MAALOX/MYLANTA) 200-200-20 MG/5ML suspension 30 mL  30 mL Oral PRN Doug Sou, MD      . DISCONTD: folic acid (FOLVITE) tablet 1 mg  1 mg Oral Daily Doug Sou, MD   1 mg at 02/20/12 2319  . DISCONTD: LORazepam (ATIVAN) injection 1 mg  1 mg Intravenous Q6H PRN Doug Sou, MD      . DISCONTD: LORazepam (ATIVAN) tablet 1 mg  1 mg Oral Q8H PRN Doug Sou, MD   1 mg at 02/20/12 2319  . DISCONTD: LORazepam (ATIVAN) tablet 1 mg  1 mg Oral Q6H PRN Doug Sou, MD      . DISCONTD: multivitamin with minerals tablet 1 tablet  1 tablet Oral Daily Doug Sou, MD   1 tablet at 02/20/12 2319  . DISCONTD: nicotine (NICODERM CQ - dosed in mg/24 hours) patch 21 mg  21 mg Transdermal Daily Doug Sou, MD      . DISCONTD: thiamine (B-1) injection 100 mg  100 mg Intravenous Daily Doug Sou, MD      . DISCONTD: thiamine (VITAMIN B-1) tablet 100 mg  100 mg Oral Daily Doug Sou, MD   100 mg at 02/20/12 2319  . DISCONTD: zolpidem (AMBIEN) tablet 5 mg  5 mg Oral QHS PRN Doug Sou, MD        Lab Results:  Results for orders placed during the hospital encounter of 02/20/12 (from the past 48 hour(s))  CBC     Status: Abnormal   Collection Time   02/20/12  6:20 PM      Component Value Range Comment   WBC 7.1  4.0 - 10.5 K/uL    RBC 3.89 (*) 4.22 - 5.81 MIL/uL    Hemoglobin 11.6 (*) 13.0 - 17.0 g/dL    HCT 30.8 (*) 65.7 - 52.0 %    MCV 88.2  78.0 - 100.0 fL    MCH 29.8  26.0 - 34.0 pg    MCHC 33.8  30.0 - 36.0 g/dL    RDW 84.6  96.2 - 95.2 %    Platelets 168  150 - 400 K/uL   DIFFERENTIAL     Status: Abnormal   Collection Time   02/20/12  6:20 PM      Component Value Range Comment   Neutrophils Relative 42 (*) 43 - 77 %    Neutro Abs 3.0  1.7 - 7.7 K/uL    Lymphocytes Relative 40  12 - 46 %    Lymphs Abs 2.9  0.7 - 4.0 K/uL    Monocytes Relative 13 (*) 3 - 12 %    Monocytes Absolute 0.9  0.1 - 1.0 K/uL    Eosinophils Relative 2  0 - 5 %    Eosinophils Absolute 0.1   0.0 -  0.7 K/uL    Basophils Relative 3 (*) 0 - 1 %    Basophils Absolute 0.2 (*) 0.0 - 0.1 K/uL    WBC Morphology ATYPICAL LYMPHOCYTES     COMPREHENSIVE METABOLIC PANEL     Status: Abnormal   Collection Time   02/20/12  6:20 PM      Component Value Range Comment   Sodium 138  135 - 145 mEq/L    Potassium 3.5  3.5 - 5.1 mEq/L    Chloride 105  96 - 112 mEq/L    CO2 21  19 - 32 mEq/L    Glucose, Bld 92  70 - 99 mg/dL    BUN 12  6 - 23 mg/dL    Creatinine, Ser 1.61  0.50 - 1.35 mg/dL    Calcium 8.7  8.4 - 09.6 mg/dL    Total Protein 7.5  6.0 - 8.3 g/dL    Albumin 3.9  3.5 - 5.2 g/dL    AST 045 (*) 0 - 37 U/L    ALT 75 (*) 0 - 53 U/L    Alkaline Phosphatase 116  39 - 117 U/L    Total Bilirubin 0.3  0.3 - 1.2 mg/dL    GFR calc non Af Amer >90  >90 mL/min    GFR calc Af Amer >90  >90 mL/min   ETHANOL     Status: Abnormal   Collection Time   02/20/12  6:20 PM      Component Value Range Comment   Alcohol, Ethyl (B) 257 (*) 0 - 11 mg/dL   URINE RAPID DRUG SCREEN (HOSP PERFORMED)     Status: Abnormal   Collection Time   02/20/12  6:26 PM      Component Value Range Comment   Opiates NONE DETECTED  NONE DETECTED    Cocaine POSITIVE (*) NONE DETECTED    Benzodiazepines POSITIVE (*) NONE DETECTED    Amphetamines NONE DETECTED  NONE DETECTED    Tetrahydrocannabinol NONE DETECTED  NONE DETECTED    Barbiturates NONE DETECTED  NONE DETECTED     Physical Findings: AIMS:  , ,  ,  ,    CIWA:    COWS:     Risk: Risk of harm to self is elevated by his anxiety and his alcohol addiction  Risk of harm to others is elevated by his history  Treatment Plan Summary: Daily contact with patient to assess and evaluate symptoms and progress in treatment Medication management  Plan: Admit, start Tegretol for detox, to prevent withdrawal seizures, and to treat anxiety.  Discussed the risks, benefits, and probable clinical course with and without treatment.  Pt is agreeable to the current course of  treatment. We will continue on q. 15 checks the unit protocol. At this time there is no clinical indication for one-to-one observation as patient contract for safety and presents little risk to harm themself and others.  We will increase collateral information. I encourage patient to participate in group milieu therapy. Pt will be seen in treatment team soon for further treatment and appropriate discharge planning. Please see history and physical note for more detailed information ELOS: 3 to 5 days.   Crockett Rallo 02/21/2012, 9:43 PM

## 2012-02-22 NOTE — Progress Notes (Addendum)
Pt attended discharge planning group and actively participated.  Pt presents with calm mood and affect.  Pt rates depression at a 7 and anxiety at an 8 today.  Pt denies SI.  Pt explained that he was d/c from here last Friday and immediately went out to drink.  Pt states that he came back to the hospital because he felt bad for his family that he disappointed them.  Pt states that he wants to go to [facility] when stable.  Pt states that he lives with his sister in [place] and is able to return to her home.  Pt is concerned about his court dates on July 3 and July 9.  No further needs voiced by pt at this time.  Safety planning and suicide prevention discussed.     Writer contacted [facility] and was advised no beds available today.  Reyes Ivan, LCSWA 02/22/2012  9:42 AM

## 2012-02-22 NOTE — Progress Notes (Signed)
[facility] Group Notes:  (Counselor/Nursing/MHT/Case Management/Adjunct)  02/22/2012 3:58 PM  Type of Therapy:  Group Therapy at 11  Participation Level:  Active  Participation Quality:  Appropriate  Affect:  Appropriate  Cognitive:  Alert and Oriented  Insight:  Good  Engagement in Group:  Good  Engagement in Therapy:  Good  Modes of Intervention:  Clarification, Socialization and Support  Summary of Progress/Problems: Focus of group processing discussion was on balance in life; the components in life which have a negative influence on balance and the components which make for a more balanced life.  Patient shared that negatives for him are "too much alcohol, too much negativity and hopelessness."Vannak also shared that positives for a more balanced life would include stable clean living environment and an income to meet basic needs and something to look forward to."   Clide Dales 02/22/2012, 3:58 PMBHH Group Notes:  (Counselor/Nursing/MHT/Case Management/Adjunct)  02/22/2012 3:58 PM  Type of Therapy:  Group Therapy  Participation Level:  Active  Participation Quality:  Appropriate  Affect:  Appropriate  Cognitive:  Appropriate  Insight:  Good  Engagement in Group:  Good  Engagement in Therapy:  Good  Modes of Intervention:  Clarification, Socialization and Support  Summary of Progress/Problems: participated in activity in which patients choose photographs to represent what their life would look and feel like were it in balance and another for out of balance. Myson's photo choices included fence with barbed wire and a baby bird held in a human hand. "This little bird could grow big and strong and fly over that prison fence; but if I continue in my addiction I may have to go behind that prison fence."   Clide Dales 02/22/2012, 3:58 PM

## 2012-02-22 NOTE — Progress Notes (Signed)
(  D) Patient sleepy this AM. Approached window for medications and presented as flat, sad, anxious, with minimal eye contact. Reports sleeping well last night, appetite good, energy level normal, and ability to pay attention good. Rates his depression as 7/10 and sense of hopelessness as 5/10. Denies SI/HI or A/V hallucinations. Reports experiencing headaches as part of his withdrawal. (A) Patient encouraged to let staff know if he needs assistance throughout the day. Encouragement and support given. Clonidine dose held this Am for low blood pressure parameters. Patient encouraged to go to all groups today. (R) Patient pleasant and cooperative.   Joice Lofts RN MS EdS 02/22/2012  8:31 AM

## 2012-02-22 NOTE — Progress Notes (Signed)
Psychoeducational Group Note  Date:  02/21/2012  Time:  2019  Group Topic/Focus:  Wrap-Up Group:   The focus of this group is to help patients review their daily goal of treatment and discuss progress on daily workbooks.  Participation Level:  Minimal  Participation Quality:  Appropriate and Attentive  Affect:  Appropriate  Cognitive:  Appropriate  Insight:  Limited  Engagement in Group:  Limited  Additional Comments:  Pt attended NA group this evening.    Aundria Rud, Jaquin Coy L 02/22/2012, 12:19 AM

## 2012-02-23 MED ORDER — CARBAMAZEPINE 200 MG PO TABS: 200.0000 mg | ORAL_TABLET | Freq: Three times a day (TID) | ORAL | Status: DC

## 2012-02-23 NOTE — Progress Notes (Signed)
See d/c note
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
## 2012-02-23 NOTE — Discharge Summary (Signed)
Physician Discharge Summary Note  Patient:  Mario Andrews is an 51 y.o., male MRN:  [id number] DOB:  28-Feb-1961 Patient phone:  [phone] (home)  Patient address:   [address]  [place] [address],   Date of Admission:  02/21/2012 Date of Discharge:  02/23/2012  Reason for Admission: Relapse  Discharge Diagnoses: Principal Problem:  *Alcohol abuse, continuous  Axis Diagnosis:  Discharge Diagnoses: Alcohol Dependence; Cocaine Abuse; SIMD; s/p TIA/CVA 2 yrs ago; COPD   Level of Care:  OP  Hospital Course:  Mario Andrews was readmitted after he relapsed with in hours of being discharged from [facility].  (please see previous discharge summary) Mario Andrews was treated with librium protocol and started on Tegretol for anger issues and impulse control. He was initially interested in going to [facility] upon discharge but was not accepted due to his current court dates for assault with a deadly weapon, and breaking and entering. He then asked to be discharged out to live with a friend.  The treatment team evaluated him and felt that he had met his maximum therapeutic benefits at [facility].  Consults:  None  Significant Diagnostic Studies:  none  Discharge Vitals:   Blood pressure 90/61, pulse 90, temperature 98.1 F (36.7 C), temperature source Oral, resp. rate 16, height 6\' 1"  (1.854 m), weight 64.864 kg (143 lb).  Mental Status Exam: See Mental Status Examination and Suicide Risk Assessment completed by Attending Physician prior to discharge.  Discharge destination:  Home Is patient on multiple antipsychotic therapies at discharge:  No   Has Patient had three or more failed trials of antipsychotic monotherapy by history:  No Recommended Plan for Multiple Antipsychotic Therapies: not applicable  Discharge Orders    Future Orders Please Complete By Expires   Diet - low sodium heart healthy      Increase activity slowly      Discharge instructions      Comments:   Take all medications  as prescribed.  Keep all of your scheduled follow up appointments to get your medications in a timely manner.  This will avoid a disruption in your dosing schedule.     Medication List  As of 02/23/2012 12:34 PM   STOP taking these medications         traZODone 50 MG tablet         TAKE these medications      Indication    carbamazepine 200 MG tablet   Commonly known as: TEGRETOL   Take 1 tablet (200 mg total) by mouth 3 (three) times daily. For detox, anger control, and mood stabilization.    Indication: Dyscontrol Syndrome             Follow-up recommendations:  Due to his poor motivation and lack of participation in the programs at [facility], it is felt that Mario Andrews would be better served at another facility.  Comments:  Mario Andrews has met his maximum therapeutic benefits at [facility].   Signed: Rona Ravens. Tykel Badie PAC for  Dr. Lupe Carney 02/23/2012, 12:34 PM

## 2012-02-23 NOTE — Tx Team (Signed)
Interdisciplinary Treatment Plan Update (Adult)  Date:  02/23/2012  Time Reviewed:  10:19 AM   Progress in Treatment: Attending groups:   Yes   Participating in groups:  Yes Taking medication as prescribed:  Yes Tolerating medication:  Yes Family/Significant othe contact made:  Patient understands diagnosis:  Yes Discussing patient identified problems/goals with staff: Yes Medical problems stabilized or resolved: Yes Denies suicidal/homicidal ideation:Yes Issues/concerns per patient self-inventory:  Other:  New problem(s) identified:  Reason for Continuation of Hospitalization:  Interventions implemented related to continuation of hospitalization:  Additional comments:  Estimated length of stay: Discharge today  Discharge Plan:  New goal(s):  Review of initial/current patient goals per problem list:    1.  Goal(s): Detox from ETOH   Met:  Yes  Target date: d/c  As evidenced by:  Patient to complete  2.  Goal (s):  Stabilize on medicaitons  Met:  Yes  Target date: d/c  As evidenced by:  Patient reports being stable on medications - symptoms have decreased 4.  Goal(s): Refer for outpatient follow up   Met:  Yes  Target date: d/c  As evidenced by:  Patient to follow up with [facility]  Attendees: Patient:  Mario Andrews 02/23/2012 10:20 AM  Family:     Physician:  Enis Gash, MD 02/23/2012 10:19 AM   Nursing:  Alease Frame, RN  02/23/2012 10:19 AM   CaseManager:  Juline Patch, LCSW 02/23/2012 10:19 AM   Counselor:  Alex Gardener, LCSWA 02/23/2012 10:19 AM   Other:  Reyes Ivan, LCSWA

## 2012-02-23 NOTE — Progress Notes (Signed)
Writer met with patient after being advised that there could be legal reason for patient not to return to the residence he shares with a friend.  Patient advised he had just gotten off the phone with friend and he can return without problems.

## 2012-02-23 NOTE — Progress Notes (Signed)
[name]:  Will you be returning to the same living situation after discharge: Yes,  Returning to friend's home At discharge, do you have transportation home?:Yes,  Patient to arrange transportation home Do you have the ability to pay for your medications:Yes,  Patient will be assisted with indigent medications  Interagency Information:     Release of information consent forms completed and in the chart;  Patient's signature needed at discharge.  Patient to Follow up at:  [facility]    Monday, February 26, 2012 8AM-12:00PM or 1:00-2:30 PM    Patient denies SI/HI:   Yes,  Patient is not endorsing SI/or other thoughts of self harm    Safety Planning and Suicide Prevention discussed:  Yes,  Reviewed with all patients during after care group  Barrier to discharge identified:  Yes.  Legal issues, unable to go into a residential program  Summary and Recommendations: Patient encourage to be compliant with medications and follow up with outpatient recommedations    Mario Andrews, Mario Andrews July 02/23/2012, 10:29 AM

## 2012-02-23 NOTE — Progress Notes (Signed)
Lying quietly in bed with eyes closed. Exhibiting normal sleep behavior. Safety checks conducted Q 15 minutes.
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
## 2012-02-23 NOTE — Progress Notes (Signed)
[facility] Group Notes:  (Counselor/Nursing/MHT/Case Management/Adjunct)  02/23/2012 2:11 PM  Type of Therapy:  Group Therapy  Participation Level:  Did Not Attend  Clide Dales 02/23/2012, 2:11 PM  [facility] Group Notes:  (Counselor/Nursing/MHT/Case Management/Adjunct)  02/23/2012 2:12 PM  Type of Therapy:  Group Therapy  Participation Level:  Minimal  Participation Quality:  Attentive  Affect:  Appropriate  Cognitive:  Alert and Oriented  Insight:  Limited  Engagement in Group:  Improved  Engagement in Therapy:  Limited  Modes of Intervention:  Clarification and Support  Summary of Progress/Problems: Group processing session centered on feelings about relapse and cycle of addiction.  Patient shared feelings of frustration often lead to first drink and feelings of anger and agitation often result from drinking.  When asked to share what has been most helpful in the past Jaloni shared "I need to stay away from my sister, maybe my whole family."  Others in group offered support   Clide Dales 02/23/2012, 2:19 PM

## 2012-02-23 NOTE — Progress Notes (Signed)
Patient discharged home per MD order.  He denies any SI/HI/AVH.  He is following up with [facility] and [facility].  Patient received samples of medication and prescriptions.  He understood all his instructions and was eager for discharge.  He received all his personal belongings out of his locker.  He was pleasant and cooperative with staff.  He left ambulatory for the bus station.

## 2012-02-23 NOTE — Progress Notes (Signed)
[facility] Adult Inpatient Family/Significant Other Suicide Prevention Education  Suicide Prevention Education:  Education Completed; Velna Ochs at [phone] has been identified by the patient as the family member/significant other with whom the patient will be residing, and identified as the person(s) who will aid the patient in the event of a mental health crisis (suicidal ideations/suicide attempt).  With written consent from the patient, the family member/significant other has been provided the following suicide prevention education, prior to the and/or following the discharge of the patient.  The suicide prevention education provided includes the following:  Suicide risk factors  Suicide prevention and interventions  National Suicide Hotline telephone number  [facility] assessment telephone number  [facility] Emergency Assistance 911  [facility] and/or Residential Mobile Crisis Unit telephone number  Request made of family/significant other to:  Remove weapons (e.g., guns, rifles, knives), all items previously/currently identified as safety concern.    Remove drugs/medications (over-the-counter, prescriptions, illicit drugs), all items previously/currently identified as a safety concern.  There are no firearms nor narcotics in the home  The friend with whom patient will be staying other verbalizes understanding of the suicide prevention education information provided.  The family member/significant other agrees to remove the items of safety concern listed above.  Clide Dales 02/23/2012, 12:59 PM

## 2012-02-27 ENCOUNTER — Emergency Department (HOSPITAL_COMMUNITY): Payer: Medicaid Other

## 2012-02-27 ENCOUNTER — Emergency Department (HOSPITAL_COMMUNITY)
Admission: EM | Admit: 2012-02-27 | Discharge: 2012-02-27 | Disposition: A | Discharge status: Home / Self Care | Payer: Medicaid Other | Attending: Emergency Medicine | Admitting: Emergency Medicine

## 2012-02-27 ENCOUNTER — Encounter (HOSPITAL_COMMUNITY): Payer: Self-pay | Admitting: Emergency Medicine

## 2012-02-27 DIAGNOSIS — IMO0002 Reserved for concepts with insufficient information to code with codable children: Secondary | ICD-10-CM

## 2012-02-27 DIAGNOSIS — R42 Dizziness and giddiness: Secondary | ICD-10-CM | POA: Insufficient documentation

## 2012-02-27 DIAGNOSIS — F1010 Alcohol abuse, uncomplicated: Secondary | ICD-10-CM | POA: Insufficient documentation

## 2012-02-27 DIAGNOSIS — J4489 Other specified chronic obstructive pulmonary disease: Secondary | ICD-10-CM | POA: Insufficient documentation

## 2012-02-27 DIAGNOSIS — F17200 Nicotine dependence, unspecified, uncomplicated: Secondary | ICD-10-CM | POA: Insufficient documentation

## 2012-02-27 DIAGNOSIS — J449 Chronic obstructive pulmonary disease, unspecified: Secondary | ICD-10-CM | POA: Insufficient documentation

## 2012-02-27 DIAGNOSIS — F341 Dysthymic disorder: Secondary | ICD-10-CM | POA: Insufficient documentation

## 2012-02-27 LAB — COMPREHENSIVE METABOLIC PANEL
ALT: 87 U/L — ABNORMAL HIGH (ref 0–53)
AST: 92 U/L — ABNORMAL HIGH (ref 0–37)
Albumin: 3.5 g/dL (ref 3.5–5.2)
Alkaline Phosphatase: 105 U/L (ref 39–117)
BUN: 7 mg/dL (ref 6–23)
CO2: 24 mEq/L (ref 19–32)
Calcium: 8.5 mg/dL (ref 8.4–10.5)
Chloride: 97 mEq/L (ref 96–112)
Creatinine, Ser: 0.68 mg/dL (ref 0.50–1.35)
GFR calc Af Amer: >90 mL/min (ref >90)
GFR calc non Af Amer: >90 mL/min (ref >90)
Glucose, Bld: 82 mg/dL (ref 70–99)
Potassium: 3.4 mEq/L — ABNORMAL LOW (ref 3.5–5.1)
Sodium: 133 mEq/L — ABNORMAL LOW (ref 135–145)
Total Bilirubin: 0.2 mg/dL — ABNORMAL LOW (ref 0.3–1.2)
Total Protein: 6.9 g/dL (ref 6.0–8.3)

## 2012-02-27 LAB — CBC WITH DIFFERENTIAL/PLATELET
Basophils Absolute: 0 10*3/uL (ref 0.0–0.1)
Basophils Relative: 1 % (ref 0–1)
Eosinophils Relative: 2 % (ref 0–5)
HCT: 35.3 % — ABNORMAL LOW (ref 39.0–52.0)
MCHC: 34.8 g/dL (ref 30.0–36.0)
Monocytes Absolute: 0.4 10*3/uL (ref 0.1–1.0)
Neutro Abs: 2.5 10*3/uL (ref 1.7–7.7)
RDW: 14.2 % (ref 11.5–15.5)

## 2012-02-27 LAB — CARBAMAZEPINE LEVEL, TOTAL: Carbamazepine Lvl: 12.1 ug/mL — ABNORMAL HIGH (ref 4.0–12.0)

## 2012-02-27 MED ORDER — SODIUM CHLORIDE 0.9 % IV BOLUS (SEPSIS)
2000.0000 mL | Freq: Once | INTRAVENOUS | Status: AC
  Administered 2012-02-27: 2000 mL via INTRAVENOUS
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
## 2012-02-27 NOTE — ED Notes (Signed)
CBG at this time= 79.
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
## 2012-02-27 NOTE — ED Notes (Signed)
Brought in by EMS from a hotel where he is staying with his sister after his fall. Per EMS, pt is on Tegretol---has had taken 3 doses and had "two beers" today;  Arrived to room with c-collar in place.

## 2012-02-27 NOTE — ED Notes (Signed)
Call [phone] if Mario Andrews needs a ride back home.

## 2012-02-28 LAB — GLUCOSE, CAPILLARY: Glucose-Capillary: 79 mg/dL (ref 70–99)

## 2012-02-28 NOTE — Progress Notes (Addendum)
Patient Discharge Instructions:  After Visit Summary (AVS):   Faxed to:  02/26/2012 Psychiatric Admission Assessment Note:   Faxed to:  02/26/2012 Suicide Risk Assessment - Discharge Assessment:   Faxed to:  02/26/2012 Faxed/Sent to the Next Level Care provider:  02/26/2012  Faxed to [facility] the [place] @ [phone] And to [facility] @ [phone]  Wandra Scot, 02/28/2012, 9:15 AM

## 2012-02-29 NOTE — ED Provider Notes (Signed)
Medical screening examination/treatment/procedure(s) were performed by non-physician practitioner and as supervising physician I was immediately available for consultation/collaboration.   Loren Racer, MD 02/29/12 [phone]

## 2012-03-15 ENCOUNTER — Emergency Department (HOSPITAL_COMMUNITY)
Admission: EM | Admit: 2012-03-15 | Discharge: 2012-03-16 | Disposition: A | Discharge status: Other Acute Inpatient Hospital | Payer: Medicaid Other | Attending: Emergency Medicine | Admitting: Emergency Medicine

## 2012-03-15 ENCOUNTER — Encounter (HOSPITAL_COMMUNITY): Payer: Self-pay | Admitting: *Deleted

## 2012-03-15 DIAGNOSIS — F411 Generalized anxiety disorder: Secondary | ICD-10-CM | POA: Insufficient documentation

## 2012-03-15 DIAGNOSIS — R45851 Suicidal ideations: Secondary | ICD-10-CM | POA: Insufficient documentation

## 2012-03-15 DIAGNOSIS — J449 Chronic obstructive pulmonary disease, unspecified: Secondary | ICD-10-CM | POA: Insufficient documentation

## 2012-03-15 DIAGNOSIS — F17200 Nicotine dependence, unspecified, uncomplicated: Secondary | ICD-10-CM | POA: Insufficient documentation

## 2012-03-15 DIAGNOSIS — G40909 Epilepsy, unspecified, not intractable, without status epilepticus: Secondary | ICD-10-CM | POA: Insufficient documentation

## 2012-03-15 DIAGNOSIS — F1910 Other psychoactive substance abuse, uncomplicated: Secondary | ICD-10-CM | POA: Insufficient documentation

## 2012-03-15 DIAGNOSIS — F329 Major depressive disorder, single episode, unspecified: Secondary | ICD-10-CM | POA: Insufficient documentation

## 2012-03-15 DIAGNOSIS — F3289 Other specified depressive episodes: Secondary | ICD-10-CM | POA: Insufficient documentation

## 2012-03-15 DIAGNOSIS — Z91010 Allergy to peanuts: Secondary | ICD-10-CM | POA: Insufficient documentation

## 2012-03-15 DIAGNOSIS — J4489 Other specified chronic obstructive pulmonary disease: Secondary | ICD-10-CM | POA: Insufficient documentation

## 2012-03-15 LAB — RAPID URINE DRUG SCREEN, HOSP PERFORMED
Barbiturates: NOT DETECTED
Cocaine: NOT DETECTED
Tetrahydrocannabinol: NOT DETECTED

## 2012-03-15 LAB — COMPREHENSIVE METABOLIC PANEL
ALT: 92 U/L — ABNORMAL HIGH (ref 0–53)
Alkaline Phosphatase: 107 U/L (ref 39–117)
BUN: 9 mg/dL (ref 6–23)
CO2: 22 mEq/L (ref 19–32)
Chloride: 98 mEq/L (ref 96–112)
GFR calc Af Amer: >90 mL/min (ref >90)
GFR calc non Af Amer: >90 mL/min (ref >90)
Glucose, Bld: 81 mg/dL (ref 70–99)
Potassium: 3.6 mEq/L (ref 3.5–5.1)
Sodium: 134 mEq/L — ABNORMAL LOW (ref 135–145)
Total Bilirubin: 0.3 mg/dL (ref 0.3–1.2)
Total Protein: 7.5 g/dL (ref 6.0–8.3)

## 2012-03-15 LAB — CBC
HCT: 37.4 % — ABNORMAL LOW (ref 39.0–52.0)
Hemoglobin: 13 g/dL (ref 13.0–17.0)
MCHC: 34.8 g/dL (ref 30.0–36.0)
RBC: 4.29 MIL/uL (ref 4.22–5.81)

## 2012-03-15 LAB — ETHANOL: Alcohol, Ethyl (B): 305 mg/dL — ABNORMAL HIGH (ref 0–11)

## 2012-03-15 MED ORDER — ONDANSETRON HCL 4 MG PO TABS: 4.0000 mg | ORAL_TABLET | Freq: Three times a day (TID) | ORAL | Status: DC | PRN

## 2012-03-15 MED ORDER — LORAZEPAM 1 MG PO TABS: 1.0000 mg | ORAL_TABLET | Freq: Three times a day (TID) | ORAL | Status: DC | PRN

## 2012-03-15 MED ORDER — ZOLPIDEM TARTRATE 5 MG PO TABS
5.0000 mg | ORAL_TABLET | Freq: Every evening | ORAL | Status: DC | PRN
Start: 2012-03-15 — End: 2012-03-16

## 2012-03-15 MED ORDER — IBUPROFEN 600 MG PO TABS: 600.0000 mg | ORAL_TABLET | Freq: Three times a day (TID) | ORAL | Status: DC | PRN

## 2012-03-15 MED ORDER — NICOTINE 21 MG/24HR TD PT24: 21.0000 mg | MEDICATED_PATCH | Freq: Every day | TRANSDERMAL | Status: DC

## 2012-03-15 MED ORDER — ALUM & MAG HYDROXIDE-SIMETH 200-200-20 MG/5ML PO SUSP: 30.0000 mL | ORAL | Status: DC | PRN

## 2012-03-15 NOTE — ED Notes (Signed)
Pt is homeless arrives by EMS, states has been sitting outside all day because he has no where to go. On ems arrival he told them that he was suicidal and thoughts of cutting wrist because he is homeless. VSS by ems. Has hx of psych problems.

## 2012-03-15 NOTE — ED Notes (Signed)
Pt states, "they gave me disability and my family kicked me out of my house, I have medicaid and all that, I just want to rest."

## 2012-03-15 NOTE — ED Notes (Signed)
Pt. States unable to void. Requested to be cathed.

## 2012-03-15 NOTE — ED Provider Notes (Signed)
History     CSN: [id number]  Arrival date & time 03/15/12  1925   First MD Initiated Contact with Patient 03/15/12 2119      Chief Complaint  Patient presents with  . Suicidal   HPI  History provided by the patient. Patient is a 51 year old male with history of anxiety, depression COPD and polysubstance abuse who presents with increased depression and suicidal ideations today. Patient admits to alcohol use earlier in the day. Patient states he's feeling depressed and sad about his homosexual and and current family dynamics. Patient states he was feeling thoughts of cutting his wrists. Patient has similar presentations to the emergency room in the past. He reports being at [facility] last month for similar episode. Patient states he has not been taking any medications for any. He denies any other symptoms at this time. He denies any HI. Denies any recent illness. No fever, chills, sweats. No cough or shortness of breath.    Past Medical History  Diagnosis Date  . Anxiety   . Depression   . COPD (chronic obstructive pulmonary disease)   . Seizures     last seizure 2 months ago    Past Surgical History  Procedure Date  . No past surgeries     History reviewed. No pertinent family history.  History  Substance Use Topics  . Smoking status: Current Everyday Smoker -- 1.0 packs/day for 18 years    Types: Cigarettes  . Smokeless tobacco: Never Used  . Alcohol Use: Yes     4 qts/day and smokes crack daily if he can afford it      Review of Systems  Constitutional: Negative for fever and chills.  Respiratory: Negative for cough and shortness of breath.   Gastrointestinal: Positive for nausea. Negative for vomiting.    Allergies  Peanut-containing drug products  Home Medications  No current outpatient prescriptions on file.  BP 103/66  Pulse 68  Temp 98 F (36.7 C) (Oral)  Resp 18  SpO2 93%  Physical Exam  Nursing note and vitals reviewed. Constitutional: He is  oriented to person, place, and time. He appears well-developed and well-nourished. No distress.  HENT:  Head: Normocephalic and atraumatic.       Breath smells of alcohol  Eyes: Conjunctivae and EOM are normal. Pupils are equal, round, and reactive to light.  Cardiovascular: Normal rate and regular rhythm.   Pulmonary/Chest: Effort normal and breath sounds normal. No respiratory distress. He has no wheezes. He has no rales.  Abdominal: Soft. There is no tenderness. There is no rebound and no guarding.  Neurological: He is alert and oriented to person, place, and time.  Skin: Skin is warm.  Psychiatric: He has a normal mood and affect. His behavior is normal.    ED Course  Procedures  Results for orders placed during the hospital encounter of 03/15/12  CBC      Component Value Range   WBC 7.0  4.0 - 10.5 K/uL   RBC 4.29  4.22 - 5.81 MIL/uL   Hemoglobin 13.0  13.0 - 17.0 g/dL   HCT 40.9 (*) 81.1 - 91.4 %   MCV 87.2  78.0 - 100.0 fL   MCH 30.3  26.0 - 34.0 pg   MCHC 34.8  30.0 - 36.0 g/dL   RDW 78.2  95.6 - 21.3 %   Platelets 181  150 - 400 K/uL  COMPREHENSIVE METABOLIC PANEL      Component Value Range   Sodium 134 (*) 135 -  145 mEq/L   Potassium 3.6  3.5 - 5.1 mEq/L   Chloride 98  96 - 112 mEq/L   CO2 22  19 - 32 mEq/L   Glucose, Bld 81  70 - 99 mg/dL   BUN 9  6 - 23 mg/dL   Creatinine, Ser 1.19  0.50 - 1.35 mg/dL   Calcium 8.4  8.4 - 14.7 mg/dL   Total Protein 7.5  6.0 - 8.3 g/dL   Albumin 3.7  3.5 - 5.2 g/dL   AST 829 (*) 0 - 37 U/L   ALT 92 (*) 0 - 53 U/L   Alkaline Phosphatase 107  39 - 117 U/L   Total Bilirubin 0.3  0.3 - 1.2 mg/dL   GFR calc non Af Amer >90  >90 mL/min   GFR calc Af Amer >90  >90 mL/min  ETHANOL      Component Value Range   Alcohol, Ethyl (B) 305 (*) 0 - 11 mg/dL  ACETAMINOPHEN LEVEL      Component Value Range   Acetaminophen (Tylenol), Serum <15.0  10 - 30 ug/mL  URINE RAPID DRUG SCREEN (HOSP PERFORMED)      Component Value Range   Opiates  NONE DETECTED  NONE DETECTED   Cocaine NONE DETECTED  NONE DETECTED   Benzodiazepines NONE DETECTED  NONE DETECTED   Amphetamines NONE DETECTED  NONE DETECTED   Tetrahydrocannabinol NONE DETECTED  NONE DETECTED   Barbiturates NONE DETECTED  NONE DETECTED  ETHANOL      Component Value Range   Alcohol, Ethyl (B) 166 (*) 0 - 11 mg/dL        1. Suicidal ideation       MDM  9:30PM patient presents with increased suicidal thoughts today. Patient admits to alcohol use. Patient has history of similar presentations in the past and reports being at [facility] last month. Will moved to psych ED and order tele-psych consult        Angus Seller, PA 03/16/12 [phone]

## 2012-03-15 NOTE — ED Notes (Signed)
1 bag of belongings located in locker # 3 in triage.

## 2012-03-15 NOTE — ED Notes (Signed)
Security wanded pt and belongings

## 2012-03-15 NOTE — ED Notes (Signed)
Pt. In blue scrubs and red socks. 

## 2012-03-16 ENCOUNTER — Inpatient Hospital Stay (HOSPITAL_COMMUNITY)
Admission: AD | Admit: 2012-03-16 | Discharge: 2012-03-18 | DRG: 885 | Disposition: A | Discharge status: Home / Self Care | Payer: Federal, State, Local not specified - Other | Source: Other Acute Inpatient Hospital | Attending: Psychiatry | Admitting: Psychiatry

## 2012-03-16 ENCOUNTER — Encounter (HOSPITAL_COMMUNITY): Payer: Self-pay | Admitting: *Deleted

## 2012-03-16 DIAGNOSIS — F1010 Alcohol abuse, uncomplicated: Secondary | ICD-10-CM

## 2012-03-16 DIAGNOSIS — F1020 Alcohol dependence, uncomplicated: Secondary | ICD-10-CM

## 2012-03-16 DIAGNOSIS — F411 Generalized anxiety disorder: Secondary | ICD-10-CM | POA: Diagnosis present

## 2012-03-16 DIAGNOSIS — F339 Major depressive disorder, recurrent, unspecified: Secondary | ICD-10-CM

## 2012-03-16 DIAGNOSIS — D649 Anemia, unspecified: Secondary | ICD-10-CM | POA: Diagnosis present

## 2012-03-16 DIAGNOSIS — F332 Major depressive disorder, recurrent severe without psychotic features: Principal | ICD-10-CM | POA: Diagnosis present

## 2012-03-16 DIAGNOSIS — F17200 Nicotine dependence, unspecified, uncomplicated: Secondary | ICD-10-CM | POA: Diagnosis present

## 2012-03-16 DIAGNOSIS — F10988 Alcohol use, unspecified with other alcohol-induced disorder: Secondary | ICD-10-CM | POA: Diagnosis present

## 2012-03-16 DIAGNOSIS — J4489 Other specified chronic obstructive pulmonary disease: Secondary | ICD-10-CM | POA: Diagnosis present

## 2012-03-16 DIAGNOSIS — J449 Chronic obstructive pulmonary disease, unspecified: Secondary | ICD-10-CM | POA: Diagnosis present

## 2012-03-16 LAB — ETHANOL: Alcohol, Ethyl (B): 166 mg/dL — ABNORMAL HIGH (ref 0–11)

## 2012-03-16 MED ORDER — VITAMIN B-1 100 MG PO TABS
100.0000 mg | ORAL_TABLET | Freq: Every day | ORAL | Status: DC
  Administered 2012-03-17 – 2012-03-18 (×2): 100 mg via ORAL
  Filled 2012-03-16 (×4): qty 1

## 2012-03-16 MED ORDER — FERROUS SULFATE 325 (65 FE) MG PO TABS
325.0000 mg | ORAL_TABLET | Freq: Two times a day (BID) | ORAL | Status: DC
  Administered 2012-03-16 – 2012-03-18 (×3): 325 mg via ORAL
  Filled 2012-03-16 (×9): qty 1

## 2012-03-16 MED ORDER — CITALOPRAM HYDROBROMIDE 10 MG PO TABS
10.0000 mg | ORAL_TABLET | Freq: Every day | ORAL | Status: DC
  Filled 2012-03-16: qty 1

## 2012-03-16 MED ORDER — ADULT MULTIVITAMIN W/MINERALS CH
1.0000 | ORAL_TABLET | Freq: Every day | ORAL | Status: DC
  Administered 2012-03-16 – 2012-03-18 (×3): 1 via ORAL
  Filled 2012-03-16 (×5): qty 1

## 2012-03-16 MED ORDER — HYDROXYZINE HCL 25 MG PO TABS
25.0000 mg | ORAL_TABLET | Freq: Every evening | ORAL | Status: DC | PRN
  Administered 2012-03-16 – 2012-03-17 (×2): 25 mg via ORAL
  Filled 2012-03-16: qty 56

## 2012-03-16 MED ORDER — ONDANSETRON 4 MG PO TBDP
4.0000 mg | ORAL_TABLET | Freq: Four times a day (QID) | ORAL | Status: DC | PRN
  Administered 2012-03-16: 4 mg via ORAL

## 2012-03-16 MED ORDER — ALUM & MAG HYDROXIDE-SIMETH 200-200-20 MG/5ML PO SUSP: 30.0000 mL | ORAL | Status: DC | PRN

## 2012-03-16 MED ORDER — THIAMINE HCL 100 MG/ML IJ SOLN
100.0000 mg | Freq: Once | INTRAMUSCULAR | Status: AC
  Administered 2012-03-16: 200 mg via INTRAMUSCULAR

## 2012-03-16 MED ORDER — CHLORDIAZEPOXIDE HCL 25 MG PO CAPS
25.0000 mg | ORAL_CAPSULE | Freq: Four times a day (QID) | ORAL | Status: DC | PRN
  Administered 2012-03-16: 25 mg via ORAL
  Filled 2012-03-16: qty 1

## 2012-03-16 MED ORDER — HYDROXYZINE HCL 25 MG PO TABS
25.0000 mg | ORAL_TABLET | Freq: Four times a day (QID) | ORAL | Status: DC | PRN
  Administered 2012-03-17: 25 mg via ORAL

## 2012-03-16 MED ORDER — MAGNESIUM HYDROXIDE 400 MG/5ML PO SUSP: 30.0000 mL | Freq: Every day | ORAL | Status: DC | PRN

## 2012-03-16 MED ORDER — LOPERAMIDE HCL 2 MG PO CAPS
2.0000 mg | ORAL_CAPSULE | ORAL | Status: DC | PRN
  Administered 2012-03-16: 4 mg via ORAL

## 2012-03-16 MED ORDER — ACETAMINOPHEN 325 MG PO TABS
650.0000 mg | ORAL_TABLET | Freq: Four times a day (QID) | ORAL | Status: DC | PRN
Start: 2012-03-16 — End: 2012-03-18
  Administered 2012-03-18: 650 mg via ORAL

## 2012-03-16 MED ORDER — CARBAMAZEPINE 200 MG PO TABS
200.0000 mg | ORAL_TABLET | Freq: Three times a day (TID) | ORAL | Status: DC
  Administered 2012-03-16 – 2012-03-18 (×6): 200 mg via ORAL
  Filled 2012-03-16 (×12): qty 1

## 2012-03-16 NOTE — Progress Notes (Signed)
[facility] Group Notes:  (Counselor/Nursing/MHT/Case Management/Adjunct)  03/16/2012 5:52 PM  Type of Therapy:  Group Therapy  Participation Level:  Did Not Attend  :   Berlin Hun, MSW, LCSW 03/16/2012, 5:52 PM

## 2012-03-16 NOTE — Progress Notes (Signed)
Patient ID: [name], male   DOB: [birth year], 51 y.o.   MRN: [id number] Pt is voluntary. Pt states he is here to detox. Pt states he ha been here a few times before. PT drinks 1 40oz beer a day and stated two nights ago he drunk 5 quarts of beer. Pt has a hx of anxiety and depression. It was reported pt is homeless but pt states he can go live with his father when he leaves. It was also reported that pt stated in the ED he was SI and had a plan to cut his wrist but denies that. Pt is pleasant and cooperative. Pt denies SI/HI. Pt stated last time he was here in June he had some prescriptions but was not able to fill them do to the cost and them. Pt rates depression at a 9, hopelessness/helplessness 8, and anxiety 7. Pt states that he can go to his fathers after he leaves here but would like to get his own place and stated he had just been approved for disability so could use that money for that.

## 2012-03-16 NOTE — BH Assessment (Signed)
Assessment Note   Mario Andrews is a 51 y.o. male who presents voluntarily to [facility] endorsing SI with a plan to cut his writs. Pt reports no prior attempts but states "I'll do it." Pt reports percipitating event was discovering his friend dead in his tent 2 days ago. He states he went to check on his friend and found that he died. Pt reports no other close friends or family. Pt states he has been unable to sleep since that time. He denies HI and [facility]. He reports  Prior hospitalizations at [facility] in 01/2012 for detox and 2 years ago at [facility]. Pt reports he can not contract for safety.    Pt also endorses SA, stating he drinks 3 40oz beers daily for 3-4 years. He also reports a history of crack cocaine use, stating last use was 3 weeks ago.      Axis I: Major Depression, Recurrent severe and Alcohol Dependence Axis II: Deferred Axis III:  Past Medical History  Diagnosis Date  . Anxiety   . Depression   . COPD (chronic obstructive pulmonary disease)   . Seizures     last seizure 2 months ago   Axis IV: economic problems, educational problems, housing problems, problems related to legal system/crime, problems related to social environment and problems with primary support group Axis V: 31-40 impairment in reality testing  Past Medical History:  Past Medical History  Diagnosis Date  . Anxiety   . Depression   . COPD (chronic obstructive pulmonary disease)   . Seizures     last seizure 2 months ago    Past Surgical History  Procedure Date  . No past surgeries     Family History: History reviewed. No pertinent family history.  Social History:  reports that he has been smoking Cigarettes.  He has a 18 pack-year smoking history. He has never used smokeless tobacco. He reports that he drinks alcohol. He reports that he uses illicit drugs ("Crack" cocaine).  Additional Social History:  Alcohol / Drug Use History of alcohol / drug use?: Yes Substance #1 Name of Substance 1: alcohol 1 - Age  of First Use: 14 1 - Amount (size/oz): 3-4 4oz 1 - Frequency: daily 1 - Duration: 3-4 years 1 - Last Use / Amount: 03/15/12 Substance #2 Name of Substance 2: crack cocaine 2 - Age of First Use: 17 2 - Amount (size/oz): 100 2 - Duration: off and on since 14 2 - Last Use / Amount: 02/17/12  CIWA: CIWA-Ar BP: 93/57 mmHg Pulse Rate: 63  COWS:    Allergies:  Allergies  Allergen Reactions  . Peanut-Containing Drug Products Hives    Home Medications:  (Not in a hospital admission)  OB/GYN Status:  No LMP for male patient.  General Assessment Data Location of Assessment: [facility] ED Living Arrangements: Alone Can pt return to current living arrangement?: Yes Admission Status: Voluntary Is patient capable of signing voluntary admission?: Yes Transfer from: Acute Hospital Referral Source: MD  Education Status Is patient currently in school?: No Highest grade of school patient has completed: 9th  Risk to self Suicidal Ideation: Yes-Currently Present Suicidal Intent: Yes-Currently Present Is patient at risk for suicide?: Yes Suicidal Plan?: Yes-Currently Present Specify Current Suicidal Plan: to cut wrist Access to Means: No What has been your use of drugs/alcohol within the last 12 months?: alcohol and crack cocaine Previous Attempts/Gestures: No How many times?: 0  Other Self Harm Risks: none Triggers for Past Attempts: None known Intentional Self Injurious  Behavior: None Family Suicide History: No Recent stressful life event(s): Loss (Comment) (states friend dies 2 days ago) Persecutory voices/beliefs?: No Depression: Yes Depression Symptoms: Tearfulness;Fatigue;Loss of interest in usual pleasures;Insomnia Substance abuse history and/or treatment for substance abuse?: Yes Suicide prevention information given to non-admitted patients: Not applicable  Risk to Others Homicidal Ideation: No Thoughts of Harm to Others: No Current Homicidal Intent: No Current Homicidal  Plan: No Access to Homicidal Means: No Identified Victim: none History of harm to others?: No Assessment of Violence: None Noted Violent Behavior Description: cooperative Does patient have access to weapons?: No Criminal Charges Pending?: Yes Describe Pending Criminal Charges: breaking and entering Does patient have a court date: Yes Court Date: 04/01/12  Psychosis Hallucinations: None noted Delusions: None noted  Mental Status Report Appear/Hygiene: Disheveled Eye Contact: Good Motor Activity: Unremarkable Speech: Logical/coherent Level of Consciousness: Alert Mood: Sad Affect: Appropriate to circumstance Anxiety Level: Panic Attacks Panic attack frequency: reports every 1-2 hours Most recent panic attack: 7/19 Thought Processes: Coherent;Relevant Judgement: Impaired Orientation: Person;Place;Time;Situation Obsessive Compulsive Thoughts/Behaviors: None  Cognitive Functioning Concentration: Normal Memory: Recent Intact;Remote Intact IQ: Average Insight: Poor Impulse Control: Poor Appetite: Good Weight Loss: 0  Weight Gain: 0  Sleep: Decreased Total Hours of Sleep: 4  Vegetative Symptoms: None  ADLScreening [facility] Assessment Services) Patient's cognitive ability adequate to safely complete daily activities?: Yes Patient able to express need for assistance with ADLs?: Yes Independently performs ADLs?: Yes  Abuse/Neglect [facility]) Physical Abuse: Denies Verbal Abuse: Denies Sexual Abuse: Denies  Prior Inpatient Therapy Prior Inpatient Therapy: Yes Prior Therapy Dates: 01/2012 Prior Therapy Facilty/Provider(s): [facility] Reason for Treatment: Detox/Depression   Prior Outpatient Therapy Prior Outpatient Therapy: No Prior Therapy Dates: na Prior Therapy Facilty/Provider(s): na Reason for Treatment: na  ADL Screening (condition at time of admission) Patient's cognitive ability adequate to safely complete daily activities?: Yes Patient able to express need for  assistance with ADLs?: Yes Independently performs ADLs?: Yes Weakness of Legs: None Weakness of Arms/Hands: None  Home Assistive Devices/Equipment Home Assistive Devices/Equipment: None    Abuse/Neglect Assessment (Assessment to be complete while patient is alone) Physical Abuse: Denies Verbal Abuse: Denies Sexual Abuse: Denies Exploitation of patient/patient's resources: Denies Self-Neglect: Denies Values / Beliefs Cultural Requests During Hospitalization: None Spiritual Requests During Hospitalization: None   Advance Directives (For Healthcare) Advance Directive: Patient does not have advance directive;Patient would not like information Pre-existing out of facility DNR order (yellow form or pink MOST form): No Nutrition Screen Diet: Regular Unintentional weight loss greater than 10lbs within the last month: No Problems chewing or swallowing foods and/or liquids: No Home Tube Feeding or Total Parenteral Nutrition (TPN): No Patient appears severely malnourished: No  Additional Information 1:1 In Past 12 Months?: No CIRT Risk: No Elopement Risk: No Does patient have medical clearance?: Yes     Disposition:  Disposition Disposition of Patient: Referred to;Inpatient treatment program Type of inpatient treatment program: Adult Telepsych completed and recommends inpatient mental health treatment at this time. On Site Evaluation by:   Reviewed with Physician:     Georgina Quint A 03/16/2012 2:40 AM

## 2012-03-16 NOTE — ED Notes (Signed)
Pt to [facility] ambulatory w./ security and NT, 1 bag of belongings sent.

## 2012-03-16 NOTE — Tx Team (Signed)
Initial Interdisciplinary Treatment Plan  PATIENT STRENGTHS: (choose at least two) Capable of independent living Communication skills General fund of knowledge  PATIENT STRESSORS: Financial difficulties Substance abuse Worries alot about every thing   PROBLEM LIST: Problem List/Patient Goals Date to be addressed Date deferred Reason deferred Estimated date of resolution  Substance Abuse 03/16/12     Depression 03/16/12                                                DISCHARGE CRITERIA:  Ability to meet basic life and health needs Withdrawal symptoms are absent or subacute and managed without 24-hour nursing intervention  PRELIMINARY DISCHARGE PLAN: Attend aftercare/continuing care group Attend 12-step recovery group Outpatient therapy Return to previous living arrangement  PATIENT/FAMIILY INVOLVEMENT: This treatment plan has been presented to and reviewed with the patient, Mario Andrews, and/or family member.  The patient and family have been given the opportunity to ask questions and make suggestions.  Mario Andrews 03/16/2012, 10:20 AM

## 2012-03-16 NOTE — ED Provider Notes (Signed)
  Physical Exam  BP 105/65  Pulse 58  Temp 97.4 F (36.3 C) (Oral)  Resp 20  SpO2 98%  Physical Exam  ED Course  Procedures  MDM Sleeping comfortably. Accepted at [facility] by Dr walker      Juliet Rude. Rubin Payor, MD 03/16/12 [phone]

## 2012-03-16 NOTE — H&P (Signed)
Psychiatric Admission Assessment Adult  Patient Identification:  Mario Andrews Date of Evaluation:  03/16/2012 Chief Complaint:  Major Depressive Disorder, Recurrent Severe without psychotic features 296.33 Alcohol Dependence 303.90  History of Present Illness: This is one of numerous admissions for this 51 year old caucasian male. Patient was recently discharged from this hospital. Patient reports, "I got very depressed and suicidal because I just lost my girl-friend 2 days ago. I did not hear from her when I expected to hear from her. I got concerned. I went to check on her and found her dead in a tent. I can't seem to get that image off my head. I feel like a part of me is dead too. If I don't get the help that I need, I know I am going to cut my wrist. I will do it too".  Mood Symptoms:  Anhedonia, Hopelessness, Sadness, SI,  Depression Symptoms:  depressed mood, fatigue, feelings of worthlessness/guilt, suicidal thoughts without plan,  (Hypo) Manic Symptoms:  Impulsivity, Irritable Mood,  Anxiety Symptoms:  Excessive Worry,  Psychotic Symptoms:  Hallucinations: None  PTSD Symptoms: Had a traumatic exposure:  None reported  Past Psychiatric History:  Diagnosis: Alcohol dependence   Hospitalizations: [facility] x 2   Outpatient Care: [facility]   Substance Abuse Care: [facility] in 2010   Self-Mutilation: Denies   Suicidal Attempts: Denies attempts and or thoughts.   Violent Behaviors: Denies         Past Medical History:   Past Medical History  Diagnosis Date  . Anxiety   . Depression   . COPD (chronic obstructive pulmonary disease)   . Seizures     last seizure 2 months ago    Allergies:   Allergies  Allergen Reactions  . Peanut-Containing Drug Products Hives   PTA Medications: No prescriptions prior to admission   Substance Abuse History in the last 12 months:  Substance  Age of 1st Use  Last Use  Amount  Specific Type   Nicotine  20  Prior to hosp  1 pack daily   Cigarettes   Alcohol  24  Prior to hosp  3 bottles of the 40 oz daily  Liquor   Cannabis  Denies use      Opiates  Denies use      Cocaine  Denies use, however, UDS was (+).      Methamphetamines  Denies use      LSD  Denies use      Ecstasy  Denies use      Benzodiazepines  Denies use, But UDS (+).      Caffeine       Inhalants       Others:                          Consequences of Substance Abuse:  Medical Consequences: Liver damage, Tremors, headaches, diarrhea, muscle cramps.  Legal Consequences: Arrests, jail time, loss of driving privilege.  Family Consequences: Family discord   Social History:  Current Place of Residence: [place]   Place of Birth: [place]   Family Members: "My sister"   Marital Status: Single   Children: 0  Sons:0  Daughters: 0   Relationships: "I am single"   Education: "I stopped at 9th grade"   Educational Problems/Performance: "I did not complete high school"   Religious Beliefs/Practices: None reported   History of Abuse (Emotional/Phsycial/Sexual): Denies any form of abuse   Occupational Experiences: Employed   Hotel manager  History: None.   Legal History: None reported   Family History:  History reviewed. No pertinent family history.  Mental Status Examination/Evaluation:  Objective: Appearance: Disheveled   Eye Contact:: Good   Speech: Clear and Coherent   Volume: Normal   Mood: Euthymic   Affect: Appropriate   Thought Process: Coherent   Orientation: Full   Thought Content: Rumination   Suicidal Thoughts: "Yes"  Homicidal Thoughts: No   Memory: Immediate; Good  Recent; Good  Remote; Good   Judgement: Poor   Insight: Lacking   Psychomotor Activity: Tremor and Nervousness   Concentration: Good   Recall: Good   Akathisia: No   Handed: Right   AIMS (if indicated):   Assets: Desire for Improvement   Sleep: Number of Hours: 0      Laboratory/X-Ray: None Psychological Evaluation(s)      Assessment:    AXIS  I:  Substance Induced Mood Disorder AXIS II:  Deferred AXIS III:   Past Medical History  Diagnosis Date  . Anxiety   . Depression   . COPD (chronic obstructive pulmonary disease)   . Seizures     last seizure 2 months ago   AXIS IV:  other psychosocial or environmental problems AXIS V:  11-20 some danger of hurting self or others possible OR occasionally fails to maintain minimal personal hygiene OR gross impairment in communication  Treatment Plan/Recommendations: Admit for safety, detox and stabilization.  Review and reinstate any pertinent home medications for safety.  Initiate Librium protocol for alcohol.  Start Ferous sulfate 325 mg bid for low hemoglobin.   Treatment Plan Summary: Daily contact with patient to assess and evaluate symptoms and progress in treatment Medication management Current Medications:  Current Facility-Administered Medications  Medication Dose Route Frequency Provider Last Rate Last Dose  . acetaminophen (TYLENOL) tablet 650 mg  650 mg Oral Q6H PRN Mike Craze, MD      . alum & mag hydroxide-simeth (MAALOX/MYLANTA) 200-200-20 MG/5ML suspension 30 mL  30 mL Oral Q4H PRN Mike Craze, MD      . carbamazepine (TEGRETOL) tablet 200 mg  200 mg Oral TID Mike Craze, MD   200 mg at 03/16/12 1153  . chlordiazePOXIDE (LIBRIUM) capsule 25 mg  25 mg Oral Q6H PRN Sanjuana Kava, NP      . hydrOXYzine (ATARAX/VISTARIL) tablet 25 mg  25 mg Oral QHS PRN Mike Craze, MD   25 mg at 03/16/12 1153  . hydrOXYzine (ATARAX/VISTARIL) tablet 25 mg  25 mg Oral Q6H PRN Sanjuana Kava, NP      . loperamide (IMODIUM) capsule 2-4 mg  2-4 mg Oral PRN Sanjuana Kava, NP      . magnesium hydroxide (MILK OF MAGNESIA) suspension 30 mL  30 mL Oral Daily PRN Mike Craze, MD      . multivitamin with minerals tablet 1 tablet  1 tablet Oral Daily Sanjuana Kava, NP      . ondansetron (ZOFRAN-ODT) disintegrating tablet 4 mg  4 mg Oral Q6H PRN Sanjuana Kava, NP      . thiamine (B-1)  injection 100 mg  100 mg Intramuscular Once Sanjuana Kava, NP      . thiamine (VITAMIN B-1) tablet 100 mg  100 mg Oral Daily Sanjuana Kava, NP       Facility-Administered Medications Ordered in Other Encounters  Medication Dose Route Frequency Provider Last Rate Last Dose  . DISCONTD: alum & mag hydroxide-simeth (MAALOX/MYLANTA) 200-200-20 MG/5ML  suspension 30 mL  30 mL Oral PRN Angus Seller, PA      . DISCONTD: citalopram (CELEXA) tablet 10 mg  10 mg Oral Daily John L Molpus, MD      . DISCONTD: ibuprofen (ADVIL,MOTRIN) tablet 600 mg  600 mg Oral Q8H PRN Angus Seller, PA      . DISCONTD: LORazepam (ATIVAN) tablet 1 mg  1 mg Oral Q8H PRN Angus Seller, PA      . DISCONTD: nicotine (NICODERM CQ - dosed in mg/24 hours) patch 21 mg  21 mg Transdermal Daily Angus Seller, PA      . DISCONTD: ondansetron (ZOFRAN) tablet 4 mg  4 mg Oral Q8H PRN Angus Seller, PA      . DISCONTD: zolpidem (AMBIEN) tablet 5 mg  5 mg Oral QHS PRN Angus Seller, PA       Observation Level/Precautions: Q 15 minutes checks for safety   Laboratory: ED lab findings: Etoh levels 166  Psychotherapy: Group   Medications: See medication lists   Routine PRN Medications: Yes   Consultations: None indicated at this time   Discharge Concerns: Safety and sobriety   Other:      Armandina Stammer I 7/20/20134:21 PM

## 2012-03-16 NOTE — Progress Notes (Signed)
Adult Psychosocial Assessment Update Interdisciplinary Team  Previous [facility] admissions/discharges:  Admissions Discharges  Date:02/14/12 Date:02/16/12  Date:02/21/12 Date:02/23/12  Date: Date:  Date: Date:  Date: Date:   Changes since the last Psychosocial Assessment (including adherence to outpatient mental health and/or substance abuse treatment, situational issues contributing to decompensation and/or relapse). Patient did not attend follow-up appointments from last hospital discharge on 02/23/12. Patient was unable to remember what his discharge plans were upon last discharge. Patient reported that his main difficulty is that he lacks the motivation to go to meetings.  Last Tuesday patient was notified that his disability will be going through and is hoping that he will be able to get his own place. Patient expressed that his drinking increased before his admission to hospital. He reported drinking more than usual 1/2 bottle of liquor and 5 quarts of beer.            Discharge Plan 1. Will you be returning to the same living situation after discharge?   Yes: No:      If no, what is your plan?    Returning to live with his father who he was living with prior to admission to hospital.        2. Would you like a referral for services when you are discharged? Yes:     If yes, for what services?  No:       Would like to attend AA. Would like to go to treatment but is limited to bus transportation.   Has never been to outpatient therapy or long term residential treatment. He is interested in going to [facility]. Feels that medication was helpful when hospitalized previously but was unable financially to get his prescriptions filled when left the hospital previously.      Summary and Recommendations (to be completed by the evaluator) Patient is male admitted for Alcohol abuse and is voluntarily here for detox. Patient would like a referral for long term care after discharge  but has limited transportation.  Patient would like to attend AA meetings and needs a list of meetings in the [place] area. Patient felt that medications were helpful during previous hospitalizations but that he was financially unable to afford the medications. Patient would like to have medications prescribed again. Patient recommendations include crisis stabilization, case management, group therapy and medication management.                        Signature:  Berlin Hun, 03/16/2012 2:38 PM

## 2012-03-16 NOTE — ED Notes (Signed)
Up to the bathroom 

## 2012-03-17 DIAGNOSIS — F1010 Alcohol abuse, uncomplicated: Secondary | ICD-10-CM

## 2012-03-17 DIAGNOSIS — F1994 Other psychoactive substance use, unspecified with psychoactive substance-induced mood disorder: Secondary | ICD-10-CM

## 2012-03-17 DIAGNOSIS — F339 Major depressive disorder, recurrent, unspecified: Secondary | ICD-10-CM

## 2012-03-17 NOTE — Progress Notes (Signed)
Psychoeducational Group Note  Date:  03/17/2012 Time:  945   Group Topic/Focus:  Spirituality:   The focus of this group is to discuss how one's spirituality can aide in recovery.  Participation Level:  Active  Participation Quality:  Appropriate, Attentive, Redirectable and Resistant  Affect:  Appropriate  Cognitive:  Alert, Appropriate and Oriented  Insight:  Good  Engagement in Group:  Good  Additional Comments:  Group discussed ways to ground oneself as a way to fill our spirituality. Halsey stated several ways to ground himself.   Wandra Scot 03/17/2012, 10:51 AM

## 2012-03-17 NOTE — Progress Notes (Addendum)
[facility] Group Notes:  (Counselor/Nursing/MHT/Case Management/Adjunct)  03/17/2012 3:21 PM  Type of Therapy:  After care Planning Group  Pt.  participated in after care planning group and was given [facility] Suicide prevention information and crisis numbers and agreed to use them if needed. Pt. Agreed to use them if needed. Pt. Was also given information on support groups in [employer]. As well as information on the [employer].  The group was told that our monthly theme for today was Healthy supports and booklets were given out and materials in the booklet were explained.Pt.  Stated he was having a good day that he had saw Dr. Arlester Marker yesterday. Pt. Stated that he had medication changes . Pt. Stated he slept good and states he is not having any problems with SI or HI thoughts. Pt. States he is feeling better today.    Mario Andrews [place] 03/17/2012, 3:21 PM

## 2012-03-17 NOTE — [facility] Suicide Risk Assessment (Signed)
Suicide Risk Assessment  Admission Assessment     Demographic factors:  Assessment Details Time of Assessment: Admission Information Obtained From: Patient Current Mental Status:  Current Mental Status:  (Denies SI/HI) Loss Factors:  Loss Factors: Decrease in vocational status, unepmployed Historical Factors:   no si attempts Risk Reduction Factors:  Risk Reduction Factors: Living with another person, especially a relative  CLINICAL FACTORS:   Alcohol/Substance Abuse/Dependencies  COGNITIVE FEATURES THAT CONTRIBUTE TO RISK:  Closed-mindedness    SUICIDE RISK:   Mild:  Suicidal ideation of limited frequency, intensity, duration, and specificity.  There are no identifiable plans, no associated intent, mild dysphoria and related symptoms, good self-control (both objective and subjective assessment), few other risk factors, and identifiable protective factors, including available and accessible social support.  PLAN OF CARE:   Mental Status Examination/Evaluation:  Objective: Appearance: Disheveled   Eye Contact:: Good   Speech: Clear and Coherent   Volume: Normal   Mood: Euthymic   Affect: ristricted  Thought Process: Coherent   Orientation: Full   Thought Content: Rumination   Suicidal Thoughts: no   Homicidal Thoughts: No   Memory: Immediate; Good  Recent; Good  Remote; Good   Judgement: Poor   Insight: Lacking   Psychomotor Activity: Tremor and Nervousness   Concentration: Good   Recall: Good   Akathisia: No   Handed: Right   AIMS (if indicated):   Assets: Desire for Improvement   Sleep: Number of Hours: 0    Laboratory/X-Ray: None  Psychological Evaluation(s)      Assessment:  AXIS I: alco hol dep, r/o Substance Induced Mood Disorder  AXIS II: Deferred  AXIS III:  Past Medical History   Diagnosis  Date   .  Anxiety    .  Depression    .  COPD (chronic obstructive pulmonary disease)    .  Seizures      last seizure 2 months ago    AXIS IV: other  psychosocial or environmental problems  AXIS V: 11-20 some danger of hurting self or others possible OR occasionally fails to maintain minimal personal hygiene OR gross impairment in communication  Treatment Plan/Recommendations: Admit for safety, detox and stabilization.  Review and reinstate any pertinent home medications for safety.  Initiate Librium protocol for alcohol.  Start Ferous sulfate 325 mg bid for low hemoglobin.  Treatment Plan Summary:  Daily contact with patient to assess and evaluate symptoms and progress in treatment  Medication management   Wonda Cerise 03/17/2012, 4:29 PM

## 2012-03-17 NOTE — ED Provider Notes (Signed)
Medical screening examination/treatment/procedure(s) were performed by non-physician practitioner and as supervising physician I was immediately available for consultation/collaboration.   Josselyne Onofrio, MD 03/17/12 0011 

## 2012-03-17 NOTE — Progress Notes (Signed)
[facility] MD Progress Note  03/17/2012 3:26 PM  S: "I am doing much better today, just a little shaky. I am still depressed. My depression is right at #7 today"  Diagnosis:   Axis I: Alcohol abuse, continuous, Major depressive disorder, recurrent episode Axis II: Deferred Axis III:  Past Medical History  Diagnosis Date  . Anxiety   . Depression   . COPD (chronic obstructive pulmonary disease)   . Seizures     last seizure 2 months ago   Axis IV: economic problems, occupational problems and other psychosocial or environmental problems Axis V: 11-20 some danger of hurting self or others possible OR occasionally fails to maintain minimal personal hygiene OR gross impairment in communication  ADL's:  Intact  Sleep: Good  Appetite:  Good  Suicidal Ideation:  Plan:  No Intent:  No Means:  no Homicidal Ideation:  Plan:  No Intent:  no Means:  no  AEB (as evidenced by): Per patient reports.  Mental Status Examination/Evaluation: Objective:  Appearance: Casual  Eye Contact::  Good  Speech:  Clear and Coherent  Volume:  Normal  Mood:  Depressed  Affect:  Flat  Thought Process:  Coherent  Orientation:  Full  Thought Content:  Rumination  Suicidal Thoughts:  No  Homicidal Thoughts:  No  Memory:  Immediate;   Good Recent;   Good Remote;   Good  Judgement:  Fair  Insight:  Fair  Psychomotor Activity:  Normal  Concentration:  Good  Recall:  Good  Akathisia:  No  Handed:  Right  AIMS (if indicated):     Assets:  Desire for Improvement  Sleep:  Number of Hours: 6    Vital Signs:Blood pressure 111/67, pulse 63, temperature 98.2 F (36.8 C), temperature source Oral, resp. rate 16, height 6' (1.829 m), weight 64.411 kg (142 lb). Current Medications: Current Facility-Administered Medications  Medication Dose Route Frequency Provider Last Rate Last Dose  . acetaminophen (TYLENOL) tablet 650 mg  650 mg Oral Q6H PRN Mike Craze, MD      . alum & mag hydroxide-simeth  (MAALOX/MYLANTA) 200-200-20 MG/5ML suspension 30 mL  30 mL Oral Q4H PRN Mike Craze, MD      . carbamazepine (TEGRETOL) tablet 200 mg  200 mg Oral TID Mike Craze, MD   200 mg at 03/17/12 0730  . chlordiazePOXIDE (LIBRIUM) capsule 25 mg  25 mg Oral Q6H PRN Sanjuana Kava, NP   25 mg at 03/16/12 2015  . ferrous sulfate tablet 325 mg  325 mg Oral BID WC Sanjuana Kava, NP   325 mg at 03/17/12 0730  . hydrOXYzine (ATARAX/VISTARIL) tablet 25 mg  25 mg Oral QHS PRN Mike Craze, MD   25 mg at 03/16/12 1153  . hydrOXYzine (ATARAX/VISTARIL) tablet 25 mg  25 mg Oral Q6H PRN Sanjuana Kava, NP   25 mg at 03/17/12 1148  . loperamide (IMODIUM) capsule 2-4 mg  2-4 mg Oral PRN Sanjuana Kava, NP   4 mg at 03/16/12 2015  . magnesium hydroxide (MILK OF MAGNESIA) suspension 30 mL  30 mL Oral Daily PRN Mike Craze, MD      . multivitamin with minerals tablet 1 tablet  1 tablet Oral Daily Sanjuana Kava, NP   1 tablet at 03/17/12 0730  . ondansetron (ZOFRAN-ODT) disintegrating tablet 4 mg  4 mg Oral Q6H PRN Sanjuana Kava, NP   4 mg at 03/16/12 2015  . thiamine (B-1) injection 100 mg  100 mg Intramuscular Once Sanjuana Kava, NP   200 mg at 03/16/12 1714  . thiamine (VITAMIN B-1) tablet 100 mg  100 mg Oral Daily Sanjuana Kava, NP   100 mg at 03/17/12 0730    Lab Results:  Results for orders placed during the hospital encounter of 03/15/12 (from the past 48 hour(s))  CBC     Status: Abnormal   Collection Time   03/15/12  9:14 PM      Component Value Range Comment   WBC 7.0  4.0 - 10.5 K/uL    RBC 4.29  4.22 - 5.81 MIL/uL    Hemoglobin 13.0  13.0 - 17.0 g/dL    HCT 40.9 (*) 81.1 - 52.0 %    MCV 87.2  78.0 - 100.0 fL    MCH 30.3  26.0 - 34.0 pg    MCHC 34.8  30.0 - 36.0 g/dL    RDW 91.4  78.2 - 95.6 %    Platelets 181  150 - 400 K/uL   COMPREHENSIVE METABOLIC PANEL     Status: Abnormal   Collection Time   03/15/12  9:14 PM      Component Value Range Comment   Sodium 134 (*) 135 - 145 mEq/L     Potassium 3.6  3.5 - 5.1 mEq/L    Chloride 98  96 - 112 mEq/L    CO2 22  19 - 32 mEq/L    Glucose, Bld 81  70 - 99 mg/dL    BUN 9  6 - 23 mg/dL    Creatinine, Ser 2.13  0.50 - 1.35 mg/dL    Calcium 8.4  8.4 - 08.6 mg/dL    Total Protein 7.5  6.0 - 8.3 g/dL    Albumin 3.7  3.5 - 5.2 g/dL    AST 578 (*) 0 - 37 U/L    ALT 92 (*) 0 - 53 U/L    Alkaline Phosphatase 107  39 - 117 U/L    Total Bilirubin 0.3  0.3 - 1.2 mg/dL    GFR calc non Af Amer >90  >90 mL/min    GFR calc Af Amer >90  >90 mL/min   ETHANOL     Status: Abnormal   Collection Time   03/15/12  9:14 PM      Component Value Range Comment   Alcohol, Ethyl (B) 305 (*) 0 - 11 mg/dL   ACETAMINOPHEN LEVEL     Status: Normal   Collection Time   03/15/12  9:14 PM      Component Value Range Comment   Acetaminophen (Tylenol), Serum <15.0  10 - 30 ug/mL   URINE RAPID DRUG SCREEN (HOSP PERFORMED)     Status: Normal   Collection Time   03/15/12  9:33 PM      Component Value Range Comment   Opiates NONE DETECTED  NONE DETECTED    Cocaine NONE DETECTED  NONE DETECTED    Benzodiazepines NONE DETECTED  NONE DETECTED    Amphetamines NONE DETECTED  NONE DETECTED    Tetrahydrocannabinol NONE DETECTED  NONE DETECTED    Barbiturates NONE DETECTED  NONE DETECTED   ETHANOL     Status: Abnormal   Collection Time   03/16/12  4:51 AM      Component Value Range Comment   Alcohol, Ethyl (B) 166 (*) 0 - 11 mg/dL     Physical Findings: AIMS: Facial and Oral Movements Muscles of Facial Expression: None, normal Lips and Perioral Area: None,  normal Jaw: None, normal Tongue: None, normal,Extremity Movements Upper (arms, wrists, hands, fingers): None, normal Lower (legs, knees, ankles, toes): None, normal, Trunk Movements Neck, shoulders, hips: None, normal, Overall Severity Severity of abnormal movements (highest score from questions above): None, normal Incapacitation due to abnormal movements: None, normal Patient's awareness of abnormal  movements (rate only patient's report): No Awareness, Dental Status Current problems with teeth and/or dentures?: No Does patient usually wear dentures?: No  CIWA:  CIWA-Ar Total: 5  COWS:     Treatment Plan Summary: Daily contact with patient to assess and evaluate symptoms and progress in treatment Medication management  Plan: Continue current treatment plan.  Armandina Stammer I 03/17/2012, 3:26 PM

## 2012-03-17 NOTE — Progress Notes (Signed)
[facility] Group Notes:  (Counselor/Nursing/MHT/Case Management/Adjunct)  03/17/2012 2:42 AM  Type of Therapy:  Psychoeducational Skills  Participation Level:  Minimal  Participation Quality:  Attentive  Affect:  Blunted  Cognitive:  Appropriate  Insight:  Limited  Engagement in Group:  Limited  Engagement in Therapy:  Limited  Modes of Intervention:  Education  Summary of Progress/Problems:The patient's goal for tomorrow is to get more rest and to attend groups.    Mario Andrews S 03/17/2012, 2:42 AM

## 2012-03-17 NOTE — Progress Notes (Addendum)
[facility] Family/Significant Other Suicide Prevention Education  Suicide Prevention Education:  Patient Refusal for Family/Significant Other Suicide Prevention Education: The patient Mario Andrews has refused to provide written consent for family/significant other to be provided Family/Significant Other Suicide Prevention Education during admission and/or prior to discharge.  Physician notified. Pt. Given [place] Suicide information  And Crisis numbers.  Lamar Blinks [place] 03/16/2012, 8:20 AM

## 2012-03-17 NOTE — Progress Notes (Signed)
Patient ID: Mario Andrews, male   DOB: 1960/12/25, 51 y.o.   MRN: [id number] D)  Came to med window this evening with multiple c/o's withdrawal, nausea, dry heaves, diarrhea, anxious and mildly tremulous. Attended group, interacting with select peers and staff,  A)  Was medicated according to protocol, fluids were encouraged.  Will continue to monitor, POC. R) Receptive, appreciative.

## 2012-03-17 NOTE — Progress Notes (Signed)
Patient ID: Mario Andrews, male   DOB: December 02, 1960, 51 y.o.   MRN: [id number]  [facility] Group Notes:  (Counselor/Nursing/MHT/Case Management/Adjunct)  03/17/2012 1:15 PM  Type of Therapy:  Group Therapy, Dance/Movement Therapy   Participation Level:  Did Not Attend   [name] 03/17/2012. 3:41 PM

## 2012-03-17 NOTE — Progress Notes (Signed)
Patient ID: Mario Andrews, male   DOB: 04/18/1961, 51 y.o.   MRN: [id number]  [facility] Group Notes:  (Counselor/Nursing/MHT/Case Management/Adjunct)  03/17/2012 1:15 PM  Type of Therapy:  Group Therapy, Dance/Movement Therapy   Participation Level:  Minimal  Participation Quality:  Appropriate  Affect:  Appropriate  Cognitive:  Appropriate  Insight:  Limited  Engagement in Group:  Limited  Engagement in Therapy:  Limited  Modes of Intervention:  Clarification, Problem-solving, Role-play, Socialization and Support Therapist discussed the importance of supports and the reason why supports are needed. Therapist discussed the importance of recognizing supports that are good or bad. Therapist asked group to define the term support and what does support mean to them Therapist asked group to name at least five supports in their lives. Therapist asked group what can you do to support yourself when there are no supports available. Patient stated" Supports are there to help you when you need it."      Summary of Progress/Problems:    [name] 03/17/2012. 4:01 PM

## 2012-03-17 NOTE — H&P (Signed)
  Pt was seen by me today and I agree with the key elements documented in H&P.  

## 2012-03-17 NOTE — Progress Notes (Signed)
D-Patient has been quiet but appropriate on the unit interacting with peers and attending groups with minimal participation.  A- Rates depression and hopelessness at 7.  C/O anxiety r/t withdrawal. Requested and received prn anxiety medications.  No other physical complaints.  Denies SI. R- Support and encouragement offered. Continue current POC and evaluation of goals. Continue 15' checks for safety.

## 2012-03-17 NOTE — Progress Notes (Signed)
Psychoeducational Group Note  Date:  03/17/2012 Time:  1015  Group Topic/Focus:  Identifying Needs:   The focus of this group is to help patients identify their personal needs that have been historically problematic and identify healthy behaviors to address their needs.  Participation Level:  Minimal  Participation Quality:  Appropriate  Affect:  Depressed  Cognitive:  Appropriate  Insight:  Limited  Engagement in Group:  None  Additional Comments:  Cresenciano Lick 03/17/2012, 3:23 PM

## 2012-03-18 DIAGNOSIS — F1020 Alcohol dependence, uncomplicated: Secondary | ICD-10-CM

## 2012-03-18 MED ORDER — CARBAMAZEPINE 200 MG PO TABS: 200.0000 mg | ORAL_TABLET | Freq: Three times a day (TID) | ORAL | Status: DC

## 2012-03-18 MED ORDER — ADULT MULTIVITAMIN W/MINERALS CH: 1.0000 | ORAL_TABLET | Freq: Every day | ORAL | Status: DC

## 2012-03-18 MED ORDER — FERROUS SULFATE 325 (65 FE) MG PO TABS: 325.0000 mg | ORAL_TABLET | Freq: Two times a day (BID) | ORAL | Status: DC

## 2012-03-18 MED ORDER — HYDROXYZINE HCL 25 MG PO TABS: 25.0000 mg | ORAL_TABLET | Freq: Four times a day (QID) | ORAL | Status: AC | PRN

## 2012-03-18 NOTE — H&P (Signed)
Medical/psychiatric screening examination/treatment/procedure(s) were performed by non-physician practitioner.  I have seen and examined this patient and agree the major elements of this evaluation.  

## 2012-03-18 NOTE — [facility] Counselor (Signed)
Psychoeducational Group Note  Date:  03/18/2012 Time:  11am  Group Topic/Focus:  Dimensions of Wellness:   The focus of this group is to introduce the topic of wellness and discuss the role each dimension of wellness plays in total health.  Participation Level:  Minimal  Participation Quality:  Attentive  Affect:  Depressed  Cognitive:  Appropriate  Insight:  Limited  Engagement in Group:  Limited  Additional Comments:  Mario Andrews joined the group late and did not speak much. Despite this, he appeared to be listening while others spoke and offered support to his fellow group member who is fighting cancer. Mario Andrews shared that his father is a cancer survivor.   HUQ, NADIA 03/18/2012, 12:38 PM  Cosigned by: Angus Palms, LCSW

## 2012-03-18 NOTE — [facility] Suicide Risk Assessment (Addendum)
Suicide Risk Assessment  Discharge Assessment     Demographic factors:  Male;Caucasian;Unemployed;Low socioeconomic status  (has disability now which is a support to him)  Current Mental Status Per Nursing Assessment::   On Admission:   (Denies SI/HI) At Discharge:     Current Mental Status Per Physician: Objective: Appearance: Somewhat unkempt  Eye Contact:: Good   Speech: Clear and Coherent   Volume: Normal   Mood: Euthymic   Affect: ristricted   Thought Process: Coherent   Orientation: Full   Thought Content: Rumination   Suicidal Thoughts: No  Homicidal Thoughts: No   Memory: Immediate; Good  Recent; Good  Remote; Good   Judgement: Poor   Insight: Fair  Psychomotor Activity: Tremor and Nervousness   Concentration: Good   Recall: Good   Akathisia: No   Handed: Right   AIMS (if indicated):   Assets: Desire for Improvement   Sleep: Number of Hours: 0    Loss Factors: Decrease in vocational status  Risk Reduction Factors:   Sense of responsibility to family;Living with another person, especially a relative  Continued Clinical Symptoms:  Depression:   Comorbid alcohol abuse/dependence Alcohol/Substance Abuse/Dependencies Previous Psychiatric Diagnoses and Treatments  Discharge Diagnoses: AXIS I: alcohol dep, r/o Substance Induced Mood Disorder  AXIS II: Deferred  AXIS III:  Past Medical History   Diagnosis  Date   .  Anxiety    .  Depression    .  COPD (chronic obstructive pulmonary disease)    .  Seizures      last seizure 2 months ago   AXIS IV: other psychosocial or environmental problems  AXIS V: 50  Cognitive Features That Contribute To Risk:  Loss of executive function Thought constriction (tunnel vision)    Suicide Risk:  Minimal: No identifiable suicidal ideation.  Patients presenting with no risk factors but with morbid ruminations; may be classified as minimal risk based on the severity of the depressive symptoms  Labs: Results for  orders placed during the hospital encounter of 03/15/12 (from the past 72 hour(s))  CBC     Status: Abnormal   Collection Time   03/15/12  9:14 PM      Component Value Range Comment   WBC 7.0  4.0 - 10.5 K/uL    RBC 4.29  4.22 - 5.81 MIL/uL    Hemoglobin 13.0  13.0 - 17.0 g/dL    HCT 40.9 (*) 81.1 - 52.0 %    MCV 87.2  78.0 - 100.0 fL    MCH 30.3  26.0 - 34.0 pg    MCHC 34.8  30.0 - 36.0 g/dL    RDW 91.4  78.2 - 95.6 %    Platelets 181  150 - 400 K/uL   COMPREHENSIVE METABOLIC PANEL     Status: Abnormal   Collection Time   03/15/12  9:14 PM      Component Value Range Comment   Sodium 134 (*) 135 - 145 mEq/L    Potassium 3.6  3.5 - 5.1 mEq/L    Chloride 98  96 - 112 mEq/L    CO2 22  19 - 32 mEq/L    Glucose, Bld 81  70 - 99 mg/dL    BUN 9  6 - 23 mg/dL    Creatinine, Ser 2.13  0.50 - 1.35 mg/dL    Calcium 8.4  8.4 - 08.6 mg/dL    Total Protein 7.5  6.0 - 8.3 g/dL    Albumin 3.7  3.5 - 5.2 g/dL  AST 163 (*) 0 - 37 U/L    ALT 92 (*) 0 - 53 U/L    Alkaline Phosphatase 107  39 - 117 U/L    Total Bilirubin 0.3  0.3 - 1.2 mg/dL    GFR calc non Af Amer >90  >90 mL/min    GFR calc Af Amer >90  >90 mL/min   ETHANOL     Status: Abnormal   Collection Time   03/15/12  9:14 PM      Component Value Range Comment   Alcohol, Ethyl (B) 305 (*) 0 - 11 mg/dL   ACETAMINOPHEN LEVEL     Status: Normal   Collection Time   03/15/12  9:14 PM      Component Value Range Comment   Acetaminophen (Tylenol), Serum <15.0  10 - 30 ug/mL   URINE RAPID DRUG SCREEN (HOSP PERFORMED)     Status: Normal   Collection Time   03/15/12  9:33 PM      Component Value Range Comment   Opiates NONE DETECTED  NONE DETECTED    Cocaine NONE DETECTED  NONE DETECTED    Benzodiazepines NONE DETECTED  NONE DETECTED    Amphetamines NONE DETECTED  NONE DETECTED    Tetrahydrocannabinol NONE DETECTED  NONE DETECTED    Barbiturates NONE DETECTED  NONE DETECTED   ETHANOL     Status: Abnormal   Collection Time   03/16/12  4:51  AM      Component Value Range Comment   Alcohol, Ethyl (B) 166 (*) 0 - 11 mg/dL     RISK REDUCTION FACTORS: What pt has learned from hospital stay is that they have more coping skills to use and that they need to go to AA and get a sponsor.  Risk of self harm is elevated by his addictions, but he has to take care of himself to live for.  Risk of harm to others is minimal in that he has not been involved in fights or had any legal charges filed on him.  Pt seen in treatment team where he divulged the above information. The treatment team concluded that he was ready for discharge and had met his goals for an inpatient setting.  PLAN: Discharge home Continue Medication List  As of 03/18/2012  4:17 PM   TAKE these medications      Indication    carbamazepine 200 MG tablet   Commonly known as: TEGRETOL   Take 1 tablet (200 mg total) by mouth 3 (three) times daily. For anxiety and detox       ferrous sulfate 325 (65 FE) MG tablet   Take 1 tablet (325 mg total) by mouth 2 (two) times daily with a meal. For anemia.       hydrOXYzine 25 MG tablet   Commonly known as: ATARAX/VISTARIL   Take 1 tablet (25 mg total) by mouth every 6 (six) hours as needed for anxiety.       multivitamin with minerals Tabs   Take 1 tablet by mouth daily. For nutritional supplementation.            Follow-up recommendations:  Activities: Resume typical activities Diet: Resume typical diet Other: Follow up with outpatient provider and report any side effects to out patient prescriber.   Plan:  Pt has met inpatient goals, will D/C today.  Shayne Deerman 03/18/2012, 4:06 PM

## 2012-03-18 NOTE — Discharge Summary (Signed)
Physician Discharge Summary Note  Patient:  Mario Andrews is an 51 y.o., male MRN:  [id number] DOB:  1960/11/07 Patient phone:  [phone] (home)  Patient address:   [address] [place] [address]   Date of Admission:  03/16/2012 Date of Discharge: 03/18/2012  Discharge Diagnoses: Principal Problem:  *MDD (major depressive disorder), recurrent episode  Axis Diagnosis:  AXIS I: alcohol dep, Substance Induced Mood Disorder  AXIS II: Deferred  AXIS III:  Past Medical History   Diagnosis  Date   .  Anxiety    .  Depression    .  COPD (chronic obstructive pulmonary disease)    .  Seizures      last seizure 2 months ago   AXIS IV: other psychosocial or environmental problems  AXIS V: 50  Level of Care:  OP  Hospital Course:   This is one of numerous admissions for this 51 year old caucasian male. Patient was recently discharged from this hospital. Patient reports, "I got very depressed and suicidal because I just lost my girl-friend 2 days ago. I did not hear from her when I expected to hear from her. I got concerned. I went to check on her and found her dead in a tent. I can't seem to get that image off my head. I feel like a part of me is dead too. If I don't get the help that I need, I know I am going to cut my wrist. I will do it too".  While a patient in this hospital, Mario Andrews received medication management for alcohol detox and major depression. They were ordered and received Tegretol for detox and ongoing anxiety and depression, as well as Vistaril for anxiety and Iron for anemia. They were also enrolled in group counseling sessions and activities in which they participated actively.   Patient attended treatment team meeting this am and met with treatment team members. Pt symptoms, treatment plan and response to treatment discussed. Mario Andrews endorsed that their symptoms have improved. Pt also stated that they are stable for discharge.  They reported  that from this hospital stay they had learned that they have got to stop drinking as well as to support themselves being sober.  In other to maintain their sobriety, they will continue psychiatric care on outpatient basis. They will follow-up at [facility] walk-in clinic on 7/23 between 8 AM and 3 PM.  In addition they were instructed to attend 180 meetings in 90 days and get a sponsor.  They kept promising this Clinical research associate, but were advised that they needed to promise themselves.  Upon discharge, patient adamantly denies suicidal, homicidal ideations, auditory, visual hallucinations and or delusional thinking. They left [facility] with all personal belongings via personal transportation in no apparent distress.  Consults:  None  Significant Diagnostic Studies:  labs: Blood alcohol elevated at 166, CMET showed AST elevated at 163 and ALT at 92, while Acetaminophen, UDS, CBC non contributory  Discharge Vitals:   Blood pressure 107/76, pulse 91, temperature 97.7 F (36.5 C), temperature source Oral, resp. rate 18, height 6' (1.829 m), weight 64.411 kg (142 lb)..  Mental Status Exam: See Mental Status Examination and Suicide Risk Assessment completed by Attending Physician prior to discharge.  Discharge destination:  Home  Is patient on multiple antipsychotic therapies at discharge:  No  Has Patient had three or more failed trials of antipsychotic monotherapy by history: N/A Recommended Plan for Multiple Antipsychotic Therapies: N/A  Medication List  As  of 03/18/2012 11:27 PM   TAKE these medications      Indication    carbamazepine 200 MG tablet   Commonly known as: TEGRETOL   Take 1 tablet (200 mg total) by mouth 3 (three) times daily. For anxiety and detox       ferrous sulfate 325 (65 FE) MG tablet   Take 1 tablet (325 mg total) by mouth 2 (two) times daily with a meal. For anemia.       hydrOXYzine 25 MG tablet   Commonly known as: ATARAX/VISTARIL   Take 1 tablet (25 mg total) by mouth every 6  (six) hours as needed for anxiety.       multivitamin with minerals Tabs   Take 1 tablet by mouth daily. For nutritional supplementation.            Follow-up Information    Follow up with [facility] on 03/19/2012. (Please go to [facility] walk-in clinic Tuesday, March 19, 2012  or any weekday  between 8AM-3PM)    Contact information:   [address] [place], [place]  [address]  [phone]        Follow-up recommendations:   Activities: Resume typical activities Diet: Resume typical diet Other: Follow up with outpatient provider and report any side effects to out patient prescriber.  Comments:  Take all your medications as prescribed by your mental healthcare provider. Report any adverse effects and or reactions from your medicines to your outpatient provider promptly. Patient is instructed and cautioned to not engage in alcohol and or illegal drug use while on prescription medicines. In the event of worsening symptoms, patient is instructed to call the crisis hotline, 911 and or go to the nearest ED for appropriate evaluation and treatment of symptoms.  SignedOrson Aloe 03/18/2012 11:27 PM

## 2012-03-18 NOTE — Tx Team (Signed)
Interdisciplinary Treatment Plan Update (Adult)  Date:  03/18/2012  Time Reviewed:  11:12 AM   Progress in Treatment: Attending groups:   Yes   Participating in groups:  Yes Taking medication as prescribed:  Yes Tolerating medication:  Yes Family/Significant othe contact made:  Patient understands diagnosis:  Yes Discussing patient identified problems/goals with staff: Yes Medical problems stabilized or resolved: Yes Denies suicidal/homicidal ideation:Yes Issues/concerns per patient self-inventory:  Other:  New problem(s) identified:  Reason for Continuation of Hospitalization:  Interventions implemented related to continuation of hospitalization:  Additional comments:  Estimated length of stay: Discharge today  Discharge Plan:  Home with outpatient follow up  New goal(s):  Review of initial/current patient goals per problem list:   1.  Goal(s):  Eliminate SI/other thoughts of self harm   Met:  Yes  Target date: d/c  As evidenced by: Patient no longer endorses SI/other thought self harm'  2.  Goal (s): Reduce depression/anxiety (currently rating all symptoms at one or below)   Met:  Yes  Target date: d/c  As evidenced by: Patient currently rating symptoms at four or below    3.  Goal(s): Refer for outpatient follow up   Met:  Yes  Target date: d/c  As evidenced by: Follow up appointment scheduled    4.  Goal(s): .stabilize on meds    Met:  Yes  Target date: d/c  As evidenced by: Patient reports being stable on medications - symptoms have decreased    Attendees: Patient:  Mario Andrews 03/18/2012 11:12 AM  Nursing:  Barrie Folk, RN 03/18/2012 11:12 AM  Physician:  Orson Aloe, MD 03/18/2012 11:12 AM   Nursing:  Omelia Blackwater, RN 03/18/2012 11:12 AM   CaseManager:  Juline Patch, LCSW 03/18/2012 11:12 AM   Counselor:  Angus Palms, LCSW 03/18/2012 11:12 AM   Other:  Reyes Ivan, LCSWA     03/18/2012 11:13 AM

## 2012-03-18 NOTE — Progress Notes (Signed)
(  D) Patient reports sleeping well, appetite good, energy level normal and ability to pay attention good. Rates depression at 2/10 and hopelessness at 2/10. (A) Encouraged to review coping skills prior to discharge. Patient encouraged to practice deep breathing exercises at discharge to control anxiety. Patient also encouraged to attend AA groups at discharge and to follow up with discharge appointments. (R) Patient receptive to suggestions. Denies SI/HI, no complaints.

## 2012-03-18 NOTE — Progress Notes (Signed)
[facility] Group Notes:  (Counselor/Nursing/MHT/Case Management/Adjunct)  03/18/2012 4:56 AM  Type of Therapy:  Psychoeducational Skills  Participation Level:  Minimal  Participation Quality:  Attentive  Affect:  Appropriate  Cognitive:  Appropriate  Insight:  Good  Engagement in Group:  Limited  Engagement in Therapy:  Limited  Modes of Intervention:  Education  Summary of Progress/Problems:The pt. States that he had a better day as compared to yesterday. His goal for tomorrow is to continue to get more sleep.    Hazle Coca S 03/18/2012, 4:56 AM

## 2012-03-20 NOTE — Progress Notes (Signed)
Patient Discharge Instructions:  After Visit Summary (AVS):   Faxed to:  03/19/2012 Psychiatric Admission Assessment Note:   Faxed to:  03/19/2012 Suicide Risk Assessment - Discharge Assessment:   Faxed to:  03/19/2012 Faxed/Sent to the Next Level Care provider:  03/19/2012  Faxed to [facility] @ [phone]  Heloise Purpura Eduard Clos, 03/20/2012, 5:58 PM

## 2012-03-29 ENCOUNTER — Emergency Department (HOSPITAL_COMMUNITY)
Admission: EM | Admit: 2012-03-29 | Discharge: 2012-03-30 | Disposition: A | Discharge status: Home / Self Care | Payer: Medicaid Other | Attending: Emergency Medicine | Admitting: Emergency Medicine

## 2012-03-29 DIAGNOSIS — J449 Chronic obstructive pulmonary disease, unspecified: Secondary | ICD-10-CM | POA: Insufficient documentation

## 2012-03-29 DIAGNOSIS — J4489 Other specified chronic obstructive pulmonary disease: Secondary | ICD-10-CM | POA: Insufficient documentation

## 2012-03-29 DIAGNOSIS — R000 Tachycardia, unspecified: Secondary | ICD-10-CM | POA: Insufficient documentation

## 2012-03-29 DIAGNOSIS — Z5900 Homelessness unspecified: Secondary | ICD-10-CM | POA: Insufficient documentation

## 2012-03-29 DIAGNOSIS — F17200 Nicotine dependence, unspecified, uncomplicated: Secondary | ICD-10-CM | POA: Insufficient documentation

## 2012-03-29 DIAGNOSIS — F1010 Alcohol abuse, uncomplicated: Secondary | ICD-10-CM | POA: Insufficient documentation

## 2012-03-29 DIAGNOSIS — F341 Dysthymic disorder: Secondary | ICD-10-CM | POA: Insufficient documentation

## 2012-03-29 LAB — BASIC METABOLIC PANEL
CO2: 26 mEq/L (ref 19–32)
Chloride: 108 mEq/L (ref 96–112)
Sodium: 144 mEq/L (ref 135–145)

## 2012-03-29 LAB — RAPID URINE DRUG SCREEN, HOSP PERFORMED
Amphetamines: NOT DETECTED
Benzodiazepines: NOT DETECTED
Opiates: NOT DETECTED

## 2012-03-29 LAB — ETHANOL: Alcohol, Ethyl (B): 321 mg/dL — ABNORMAL HIGH (ref 0–11)

## 2012-03-29 LAB — CBC
Platelets: 158 10*3/uL (ref 150–400)
RBC: 3.89 MIL/uL — ABNORMAL LOW (ref 4.22–5.81)
WBC: 5.2 10*3/uL (ref 4.0–10.5)

## 2012-03-29 MED ORDER — LORAZEPAM 1 MG PO TABS
1.0000 mg | ORAL_TABLET | Freq: Three times a day (TID) | ORAL | Status: DC | PRN
  Administered 2012-03-29: 1 mg via ORAL

## 2012-03-29 MED ORDER — LORAZEPAM 1 MG PO TABS: 0.0000 mg | ORAL_TABLET | Freq: Two times a day (BID) | ORAL | Status: DC

## 2012-03-29 MED ORDER — ZOLPIDEM TARTRATE 5 MG PO TABS
5.0000 mg | ORAL_TABLET | Freq: Every evening | ORAL | Status: DC | PRN
  Administered 2012-03-29: 5 mg via ORAL
  Filled 2012-03-29: qty 1

## 2012-03-29 MED ORDER — NICOTINE 21 MG/24HR TD PT24: 21.0000 mg | MEDICATED_PATCH | Freq: Every day | TRANSDERMAL | Status: DC

## 2012-03-29 MED ORDER — THIAMINE HCL 100 MG/ML IJ SOLN: 100.0000 mg | Freq: Every day | INTRAMUSCULAR | Status: DC

## 2012-03-29 MED ORDER — FOLIC ACID 1 MG PO TABS
1.0000 mg | ORAL_TABLET | Freq: Every day | ORAL | Status: DC
  Administered 2012-03-29: 1 mg via ORAL
  Filled 2012-03-29: qty 1

## 2012-03-29 MED ORDER — IBUPROFEN 200 MG PO TABS: 600.0000 mg | ORAL_TABLET | Freq: Three times a day (TID) | ORAL | Status: DC | PRN

## 2012-03-29 MED ORDER — ONDANSETRON HCL 8 MG PO TABS: 4.0000 mg | ORAL_TABLET | Freq: Three times a day (TID) | ORAL | Status: DC | PRN

## 2012-03-29 MED ORDER — VITAMIN B-1 100 MG PO TABS
100.0000 mg | ORAL_TABLET | Freq: Every day | ORAL | Status: DC
  Administered 2012-03-29: 100 mg via ORAL
  Filled 2012-03-29: qty 1

## 2012-03-29 MED ORDER — ADULT MULTIVITAMIN W/MINERALS CH
1.0000 | ORAL_TABLET | Freq: Every day | ORAL | Status: DC
  Administered 2012-03-29: 1 via ORAL
  Filled 2012-03-29: qty 1

## 2012-03-29 MED ORDER — ALUM & MAG HYDROXIDE-SIMETH 200-200-20 MG/5ML PO SUSP: 30.0000 mL | ORAL | Status: DC | PRN

## 2012-03-29 MED ORDER — LORAZEPAM 1 MG PO TABS
1.0000 mg | ORAL_TABLET | Freq: Four times a day (QID) | ORAL | Status: DC | PRN
  Filled 2012-03-29: qty 1

## 2012-03-29 MED ORDER — LORAZEPAM 1 MG PO TABS: 0.0000 mg | ORAL_TABLET | Freq: Four times a day (QID) | ORAL | Status: DC

## 2012-03-29 MED ORDER — LORAZEPAM 2 MG/ML IJ SOLN: 1.0000 mg | Freq: Four times a day (QID) | INTRAMUSCULAR | Status: DC | PRN

## 2012-03-29 NOTE — ED Notes (Signed)
Per ems pt co heart racing and fluttering.  Per ems pt has no other symptoms and they cannot verify complaint.  HR 74 104/66 pt denies any pain.  EMS reports slight Twave inversion but NSR on monitor.  Pt is alcoholic and was recently seen at behavioral health for same.  Pt on tegretol for alcohol induced seizures.  Pt cannot go to 30 day program bc he has pending felony charges. Pt is homeless and staying with friend

## 2012-03-29 NOTE — ED Notes (Addendum)
Pt moved to bed 23. Pt reports etoh use of 1/2 qt of beer daily x 2 weeks since discharge from behavioral health for the same. States last drink was this AM. Pt also reports cocaine use. Unable to verbalize amt used daily. Denies S/I H/I Pt alert and oriented. Resting in a position of comfort. Denies further questions or needs at this time.

## 2012-03-29 NOTE — ED Notes (Signed)
Pt denies having seizure for several months.  Pt admits drinking 1 quart beer today.

## 2012-03-29 NOTE — ED Provider Notes (Signed)
History     CSN: [id number]  Arrival date & time 03/29/12  1730   First MD Initiated Contact with Patient 03/29/12 1751      Chief Complaint  Patient presents with  . Tachycardia    (Consider location/radiation/quality/duration/timing/severity/associated sxs/prior treatment) HPI Patient presenting via EMS with complaint of heart fluttering and palpitations. He states he does not have the symptoms currently but was having palpitations when EMS was called. Per chart review he was recently admitted and discharged from behavioral health for alcohol detox. He states that he has been drinking alcohol daily since discharge and is also homeless. He denies suicidal or homicidal ideations. He does have a history of seizures but there is no report of seizure activity today. He denies any chest pain or shortness of breath. He has had no leg swelling.  There are no other associated systemic symptoms, there are no other alleviating or modifying factors.   Past Medical History  Diagnosis Date  . Anxiety   . Depression   . COPD (chronic obstructive pulmonary disease)   . Seizures     last seizure 2 months ago    Past Surgical History  Procedure Date  . No past surgeries     No family history on file.  History  Substance Use Topics  . Smoking status: Current Everyday Smoker -- 1.0 packs/day for 18 years    Types: Cigarettes  . Smokeless tobacco: Never Used  . Alcohol Use: Yes     4 qts/day and smokes crack daily if he can afford it      Review of Systems ROS reviewed and all otherwise negative except for mentioned in HPI  Allergies  Peanut-containing drug products  Home Medications   Current Outpatient Rx  Name Route Sig Dispense Refill  . CARBAMAZEPINE 200 MG PO TABS Oral Take 1 tablet (200 mg total) by mouth 3 (three) times daily. For anxiety and detox 90 tablet 0  . FERROUS SULFATE 325 (65 FE) MG PO TABS Oral Take 1 tablet (325 mg total) by mouth 2 (two) times daily with a  meal. For anemia. 60 tablet 0  . HYDROXYZINE HCL 25 MG PO TABS Oral Take 25 mg by mouth daily as needed. For sleep    . ADULT MULTIVITAMIN W/MINERALS CH Oral Take 1 tablet by mouth daily. For nutritional supplementation. 30 tablet 0    BP 108/67  Pulse 69  Temp 98.2 F (36.8 C) (Oral)  Resp 16  SpO2 95% Vitals reviewed Physical Exam Physical Examination: General appearance - alert, well appearing, and in no distress Mental status - alert, oriented to person, place, and time Eyes - pupils equal and reactive, no scleral icterus, no conjunctival injection Mouth - mucous membranes moist, pharynx normal without lesions Chest - clear to auscultation, no wheezes, rales or rhonchi, symmetric air entry Heart - normal rate, regular rhythm, normal S1, S2, no murmurs, rubs, clicks or gallops Abdomen - soft, nontender, nondistended, no masses or organomegaly Extremities - peripheral pulses normal, no pedal edema, no clubbing or cyanosis Skin - normal coloration and turgor, no rashes Psych- intoxicated, but cooperative, appears somnolent  ED Course  Procedures (including critical care time)   Date: 03/29/2012  Rate: 76  Rhythm: normal sinus rhythm  QRS Axis: normal  Intervals: normal  ST/T Wave abnormalities: early repolarization  Conduction Disutrbances:none  Narrative Interpretation:   Old EKG Reviewed: no sig changes compared to prior ekg of 02/27/12  8:02 PM d/w Marchelle Folks, ACT she will assess patient,  he is to be placed in Pod C   Labs Reviewed  CBC - Abnormal; Notable for the following:    RBC 3.89 (*)     Hemoglobin 11.8 (*)     HCT 33.5 (*)     All other components within normal limits  ETHANOL - Abnormal; Notable for the following:    Alcohol, Ethyl (B) 321 (*)     All other components within normal limits  URINE RAPID DRUG SCREEN (HOSP PERFORMED) - Abnormal; Notable for the following:    Cocaine POSITIVE (*)     All other components within normal limits  BASIC METABOLIC  PANEL   No results found.   1. Alcohol abuse       MDM  Pt presenting with c/o palpitations, however upon further history his primary reason for coming to the ED is requesting to go back to behavioral health for alcohol detox.  He states he is homeless.  ACT team will assess patient.  He denies being suicidal or homicidal.  Psych holding orders written.            Ethelda Chick, MD 03/30/12 Moses Manners

## 2012-03-30 NOTE — ED Provider Notes (Signed)
Patient refused by [facility] due to upcoming court date. Provided with outpatient resources. Discharged home in good condition.  Cyndra Numbers, MD 03/30/12 [phone]

## 2012-03-30 NOTE — ED Notes (Signed)
ACT team in to evaluate patient at this time

## 2012-03-30 NOTE — BH Assessment (Signed)
[facility] Assessment Progress Note      Discussed disposition with Dr Alto Denver who is in agreement that due to patient's legal troubles (upcoming court date), we are unable to provide him with inpatient detox at this time.  The patient was given outpatient referrals and may pursue inpatient treatment when his legal problems have been resolved.

## 2012-03-30 NOTE — BH Assessment (Signed)
Assessment Note   Mario Andrews is an 51 y.o. male who presents seeking detox from alcohol.  He reports he started drinking again because he's worrying about a lot of things.  He states he is seeking detox because he "needs help".  He reports drinking 4 quarts of malt liquor daily.  He was a patient at [facility] one month ago for depression and suicidal ideation after finding his friend dead in his tent, but currently denies any suicidal ideation, plan or intent.  He also denies Homicidal ideation and AVH.  Information faxed to [facility] for review.    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 Anxiety   . Depression   . COPD (chronic obstructive pulmonary disease)   . Seizures     last seizure 2 months ago   Axis IV: occupational problems and problems with primary support group Axis V: 41-50 serious symptoms  Past Medical History:  Past Medical History  Diagnosis Date  . Anxiety   . Depression   . COPD (chronic obstructive pulmonary disease)   . Seizures     last seizure 2 months ago    Past Surgical History  Procedure Date  . No past surgeries     Family History: No family history on file.  Social History:  reports that he has been smoking Cigarettes.  He has a 18 pack-year smoking history. He has never used smokeless tobacco. He reports that he drinks alcohol. He reports that he uses illicit drugs ("Crack" cocaine).  Additional Social History:  Substance #1 Name of Substance 1: Malt Liquor 1 - Age of First Use: 14 1 - Amount (size/oz): 4 quarts  1 - Frequency: daily 1 - Duration: 6-7 years 1 - Last Use / Amount: 03/29/12 0600 2 quarts of beer  CIWA: CIWA-Ar BP: 101/68 mmHg Pulse Rate: 69  Nausea and Vomiting: mild nausea with no vomiting Tactile Disturbances: none Tremor: three Auditory Disturbances: very mild harshness or ability to frighten Paroxysmal Sweats: barely perceptible sweating,  palms moist Visual Disturbances: very mild sensitivity Anxiety: mildly anxious Headache, Fullness in Head: none present Agitation: normal activity Orientation and Clouding of Sensorium: cannot do serial additions or is uncertain about date CIWA-Ar Total: 9  COWS:    Allergies:  Allergies  Allergen Reactions  . Peanut-Containing Drug Products Hives    Home Medications:  (Not in a hospital admission)  OB/GYN Status:  No LMP for male patient.  General Assessment Data Location of Assessment: [facility] ED Living Arrangements: Parent (father) Can pt return to current living arrangement?: Yes Admission Status: Voluntary Is patient capable of signing voluntary admission?: Yes Transfer from: Acute Hospital Referral Source: Self/Family/Friend  Education Status Is patient currently in school?: No Highest grade of school patient has completed: 9  Risk to self Suicidal Ideation: No-Not Currently/Within Last 6 Months Suicidal Intent: No-Not Currently/Within Last 6 Months Is patient at risk for suicide?: No Suicidal Plan?: No-Not Currently/Within Last 6 Months Access to Means: No What has been your use of drugs/alcohol within the last 12 months?: alcohol and ocassional drug use Previous Attempts/Gestures: No Intentional Self Injurious Behavior: None Family Suicide History: No Recent stressful life event(s): Loss (Comment) (friend died, ) Persecutory voices/beliefs?: No Depression: Yes Depression Symptoms: Tearfulness;Insomnia;Guilt;Feeling worthless/self pity;Feeling angry/irritable;Loss of interest in usual pleasures Substance abuse history and/or treatment for substance abuse?: Yes Suicide prevention information given to non-admitted patients: Not applicable  Risk to  Others Homicidal Ideation: No Thoughts of Harm to Others: No Current Homicidal Intent: No Current Homicidal Plan: No Access to Homicidal Means: No History of harm to others?: No Assessment of Violence: None  Noted Does patient have access to weapons?: No Criminal Charges Pending?: Yes Describe Pending Criminal Charges: breaking and entering Does patient have a court date: Yes Court Date: 04/01/12  Psychosis Hallucinations: None noted Delusions: None noted  Mental Status Report Appear/Hygiene: Disheveled Eye Contact: Fair Motor Activity: Freedom of movement Speech: Logical/coherent;Slurred Level of Consciousness: Quiet/awake Mood: Depressed Affect: Appropriate to circumstance Anxiety Level: Minimal Thought Processes: Coherent;Relevant Judgement: Unimpaired Orientation: Person;Place;Time;Situation Obsessive Compulsive Thoughts/Behaviors: Minimal  Cognitive Functioning Concentration: Decreased Memory: Remote Impaired;Remote Intact IQ: Average Insight: Fair Impulse Control: Poor Appetite: Good Sleep: Decreased Total Hours of Sleep: 3  Vegetative Symptoms: None  ADLScreening [facility] Assessment Services) Patient's cognitive ability adequate to safely complete daily activities?: Yes Patient able to express need for assistance with ADLs?: Yes Independently performs ADLs?: Yes  Abuse/Neglect [facility]) Physical Abuse: Denies Verbal Abuse: Denies Sexual Abuse: Denies  Prior Inpatient Therapy Prior Inpatient Therapy: Yes Prior Therapy Dates: 02/2012 Prior Therapy Facilty/Provider(s): [facility] Reason for Treatment: Depression, SI, Detox  Prior Outpatient Therapy Prior Outpatient Therapy: No  ADL Screening (condition at time of admission) Patient's cognitive ability adequate to safely complete daily activities?: Yes Patient able to express need for assistance with ADLs?: Yes Independently performs ADLs?: Yes       Abuse/Neglect Assessment (Assessment to be complete while patient is alone) Physical Abuse: Denies Verbal Abuse: Denies Sexual Abuse: Denies Exploitation of patient/patient's resources: Denies Self-Neglect: Denies     Merchant [employer] officer (For Healthcare) Advance  Directive: Patient does not have advance directive Nutrition Screen Diet: Regular Unintentional weight loss greater than 10lbs within the last month: No Problems chewing or swallowing foods and/or liquids: No Home Tube Feeding or Total Parenteral Nutrition (TPN): No Patient appears severely malnourished: No  Additional Information 1:1 In Past 12 Months?: No CIRT Risk: No Elopement Risk: No Does patient have medical clearance?: Yes     Disposition:  Disposition Disposition of Patient: Inpatient treatment program;Referred to Type of inpatient treatment program: Adult Patient referred to: [facility] (Pending review at [facility])  On Site Evaluation by:  Linker Reviewed with Physician:  Posey Rea Marlana Latus 03/30/2012 2:08 AM

## 2012-03-30 NOTE — ED Notes (Signed)
Pt given discharge and follow up instructions after speaking with provider. Denies further needs at this time. Ambulates to lobby in NAD  

## 2012-03-30 NOTE — ED Notes (Signed)
ACT team at bedside to update patient on plan of care - patient is to be discharged

## 2012-03-30 NOTE — BH Assessment (Signed)
[facility] Assessment Progress Note      PT declined at [facility] per Baird Lyons due to upcoming court date on 04/01/12.  No available beds at [facility].

## 2012-04-01 ENCOUNTER — Emergency Department (HOSPITAL_COMMUNITY): Payer: Federal, State, Local not specified - Other

## 2012-04-01 ENCOUNTER — Emergency Department (HOSPITAL_COMMUNITY)
Admission: EM | Admit: 2012-04-01 | Discharge: 2012-04-02 | Disposition: A | Discharge status: Rehabilitation Facility | Payer: Federal, State, Local not specified - Other | Attending: Emergency Medicine | Admitting: Emergency Medicine

## 2012-04-01 ENCOUNTER — Encounter (HOSPITAL_COMMUNITY): Payer: Self-pay | Admitting: *Deleted

## 2012-04-01 DIAGNOSIS — F1010 Alcohol abuse, uncomplicated: Secondary | ICD-10-CM | POA: Insufficient documentation

## 2012-04-01 DIAGNOSIS — F10929 Alcohol use, unspecified with intoxication, unspecified: Secondary | ICD-10-CM

## 2012-04-01 DIAGNOSIS — R079 Chest pain, unspecified: Secondary | ICD-10-CM | POA: Insufficient documentation

## 2012-04-01 DIAGNOSIS — F17200 Nicotine dependence, unspecified, uncomplicated: Secondary | ICD-10-CM | POA: Insufficient documentation

## 2012-04-01 LAB — CBC
MCH: 30.2 pg (ref 26.0–34.0)
MCV: 87.1 fL (ref 78.0–100.0)
Platelets: 154 10*3/uL (ref 150–400)
RDW: 14.2 % (ref 11.5–15.5)

## 2012-04-01 LAB — COMPREHENSIVE METABOLIC PANEL
AST: 259 U/L — ABNORMAL HIGH (ref 0–37)
Albumin: 3.8 g/dL (ref 3.5–5.2)
CO2: 24 mEq/L (ref 19–32)
Calcium: 8.6 mg/dL (ref 8.4–10.5)
Creatinine, Ser: 0.69 mg/dL (ref 0.50–1.35)
GFR calc non Af Amer: >90 mL/min (ref >90)

## 2012-04-01 LAB — RAPID URINE DRUG SCREEN, HOSP PERFORMED
Amphetamines: NOT DETECTED
Barbiturates: NOT DETECTED
Benzodiazepines: NOT DETECTED
Cocaine: POSITIVE — AB
Tetrahydrocannabinol: NOT DETECTED

## 2012-04-01 LAB — ETHANOL: Alcohol, Ethyl (B): 363 mg/dL — ABNORMAL HIGH (ref 0–11)

## 2012-04-01 MED ORDER — KETOROLAC TROMETHAMINE 60 MG/2ML IM SOLN
60.0000 mg | Freq: Once | INTRAMUSCULAR | Status: AC
  Administered 2012-04-01: 60 mg via INTRAMUSCULAR
  Filled 2012-04-01: qty 2

## 2012-04-01 NOTE — ED Notes (Signed)
Per EMS pt wants to check into rehab for etoh

## 2012-04-01 NOTE — ED Provider Notes (Signed)
History     CSN: [id number]  Arrival date & time 04/01/12  1900   First MD Initiated Contact with Patient 04/01/12 2200      Chief Complaint  Patient presents with  . Medical Clearance    (Consider location/radiation/quality/duration/timing/severity/associated sxs/prior treatment) HPI 51 year old male returns to the emergency department because of alcohol abuse. He had been in the emergency department 2 days ago and was discharged after he had been refused from our care because of an upcoming court date. He quickly returned to drinking and admits to drinking two 40 ounce beers today he is also complaining of sharp anterior chest pain since yesterday. Pain is constant and not affected by movement or deep breathing. There is no associated dyspnea, nausea, vomiting, diaphoresis. He has not done anything to try and help it. He denies other drug use. He does complain of depression, but denies suicidal ideation.  Past Medical History  Diagnosis Date  . Anxiety   . Depression   . COPD (chronic obstructive pulmonary disease)   . Seizures     last seizure 2 months ago    Past Surgical History  Procedure Date  . No past surgeries     No family history on file.  History  Substance Use Topics  . Smoking status: Current Everyday Smoker -- 1.0 packs/day for 18 years    Types: Cigarettes  . Smokeless tobacco: Never Used  . Alcohol Use: Yes     4 qts/day and smokes crack daily if he can afford it      Review of Systems  Allergies  Peanut-containing drug products  Home Medications   Current Outpatient Rx  Name Route Sig Dispense Refill  . CARBAMAZEPINE 200 MG PO TABS Oral Take 1 tablet (200 mg total) by mouth 3 (three) times daily. For anxiety and detox 90 tablet 0  . FERROUS SULFATE 325 (65 FE) MG PO TABS Oral Take 1 tablet (325 mg total) by mouth 2 (two) times daily with a meal. For anemia. 60 tablet 0  . ADULT MULTIVITAMIN W/MINERALS CH Oral Take 1 tablet by mouth daily. For  nutritional supplementation. 30 tablet 0    BP 120/74  Pulse 85  Temp 98.7 F (37.1 C) (Oral)  Resp 18  SpO2 95%  Physical Exam 51year old male, resting comfortably and in no acute distress. Vital signs are normal. Oxygen saturation is 95%, which is normal. Head is normocephalic and atraumatic. PERRLA, EOMI. Oropharynx is clear. Neck is nontender and supple without adenopathy or JVD. Back is nontender and there is no CVA tenderness. Lungs are clear without rales, wheezes, or rhonchi. Chest is nontender. Heart has regular rate and rhythm without murmur. Abdomen is soft, flat, nontender without masses or hepatosplenomegaly and peristalsis is normoactive. Extremities have no cyanosis or edema, full range of motion is present. Skin is warm and dry without rash. Neurologic: He appears clinically intoxicated with slight slurring of speech and a strong odor of ethanol on his breath, cranial nerves are intact, there are no motor or sensory deficits.  ED Course  Procedures (including critical care time)  Results for orders placed during the hospital encounter of 04/01/12  CBC      Component Value Range   WBC 5.1  4.0 - 10.5 K/uL   RBC 3.94 (*) 4.22 - 5.81 MIL/uL   Hemoglobin 11.9 (*) 13.0 - 17.0 g/dL   HCT 40.9 (*) 81.1 - 91.4 %   MCV 87.1  78.0 - 100.0 fL   MCH  30.2  26.0 - 34.0 pg   MCHC 34.7  30.0 - 36.0 g/dL   RDW 91.4  78.2 - 95.6 %   Platelets 154  150 - 400 K/uL  COMPREHENSIVE METABOLIC PANEL      Component Value Range   Sodium 141  135 - 145 mEq/L   Potassium 3.8  3.5 - 5.1 mEq/L   Chloride 105  96 - 112 mEq/L   CO2 24  19 - 32 mEq/L   Glucose, Bld 101 (*) 70 - 99 mg/dL   BUN 5 (*) 6 - 23 mg/dL   Creatinine, Ser 2.13  0.50 - 1.35 mg/dL   Calcium 8.6  8.4 - 08.6 mg/dL   Total Protein 7.8  6.0 - 8.3 g/dL   Albumin 3.8  3.5 - 5.2 g/dL   AST 578 (*) 0 - 37 U/L   ALT 138 (*) 0 - 53 U/L   Alkaline Phosphatase 114  39 - 117 U/L   Total Bilirubin 0.4  0.3 - 1.2 mg/dL    GFR calc non Af Amer >90  >90 mL/min   GFR calc Af Amer >90  >90 mL/min  ETHANOL      Component Value Range   Alcohol, Ethyl (B) 363 (*) 0 - 11 mg/dL  URINE RAPID DRUG SCREEN (HOSP PERFORMED)      Component Value Range   Opiates NONE DETECTED  NONE DETECTED   Cocaine POSITIVE (*) NONE DETECTED   Benzodiazepines NONE DETECTED  NONE DETECTED   Amphetamines NONE DETECTED  NONE DETECTED   Tetrahydrocannabinol NONE DETECTED  NONE DETECTED   Barbiturates NONE DETECTED  NONE DETECTED  TROPONIN I      Component Value Range   Troponin I <0.30  <0.30 ng/mL   Dg Chest Portable 1 View  04/01/2012  *RADIOLOGY REPORT*  Clinical Data: Smoker, COPD, medical clearance  PORTABLE CHEST - 1 VIEW  Comparison: 02/27/2012  Findings: Hyperinflation noted compatible with COPD/emphysema. Normal heart size and vascularity.  Negative for pneumonia, collapse, consolidation, edema, effusion, or pneumothorax.  Slight rotation to the left.  IMPRESSION: Stable hyperinflation compatible with COPD/emphysema  Original Report Authenticated By: Judie Petit. Ruel Favors, M.D.      Date: 04/01/2012  Rate: 64  Rhythm: normal sinus rhythm  QRS Axis: normal  Intervals: PR prolonged  ST/T Wave abnormalities: normal  Conduction Disutrbances:first-degree A-V block   Narrative Interpretation: First degree AV block. When compared with ECG of 03/29/2012, no significant changes are seen, although PR has lengthened.  Old EKG Reviewed: unchanged    1. Alcohol intoxication   2. Alcohol abuse   3. Chest pain       MDM  Alcohol abuse with alcohol intoxication. Chest pain does not appear to be anything serious. Chest x-ray and EKG will be obtained and he will be given a dose of ketorolac. Prior records are reviewed and he had been in the psych ED until 2 days ago when he was discharged because of having been refused by [facility].  Cardiac evaluation is negative. He has admitted to the psych ED for detox protocol and ACT Team consultation will  be obtained to arrange placement.       Dione Booze, MD 04/02/12 [phone]

## 2012-04-01 NOTE — ED Notes (Signed)
Pt reports he typically drinks 40 oz of beer a day. Pt states he drank 3 beers today. Pt had to go to court today and was told by lawyer to be seen by [facility] for help. Pt states he went to court for a problem that happened when he drank.  Pt reports he has been through detox three times at [facility].  Pt denies SI/HI.  Pt reports feeling shaky.

## 2012-04-01 NOTE — ED Notes (Signed)
Pt aware a urine sample is needed. Pt has urinal at bedside  

## 2012-04-02 LAB — TROPONIN I: Troponin I: <0.3 ng/mL (ref <0.30)

## 2012-04-02 MED ORDER — CARBAMAZEPINE 200 MG PO TABS: 200.0000 mg | ORAL_TABLET | Freq: Three times a day (TID) | ORAL | Status: DC

## 2012-04-02 MED ORDER — LORAZEPAM 2 MG/ML IJ SOLN: 1.0000 mg | Freq: Four times a day (QID) | INTRAMUSCULAR | Status: DC | PRN

## 2012-04-02 MED ORDER — ALUM & MAG HYDROXIDE-SIMETH 200-200-20 MG/5ML PO SUSP: 30.0000 mL | ORAL | Status: DC | PRN

## 2012-04-02 MED ORDER — NICOTINE 21 MG/24HR TD PT24
21.0000 mg | MEDICATED_PATCH | Freq: Every day | TRANSDERMAL | Status: DC
  Filled 2012-04-02: qty 1

## 2012-04-02 MED ORDER — ADULT MULTIVITAMIN W/MINERALS CH
1.0000 | ORAL_TABLET | Freq: Every day | ORAL | Status: DC
  Administered 2012-04-02: 1 via ORAL
  Filled 2012-04-02: qty 1

## 2012-04-02 MED ORDER — FOLIC ACID 1 MG PO TABS
1.0000 mg | ORAL_TABLET | Freq: Every day | ORAL | Status: DC
  Administered 2012-04-02: 1 mg via ORAL
  Filled 2012-04-02: qty 1

## 2012-04-02 MED ORDER — LORAZEPAM 1 MG PO TABS
1.0000 mg | ORAL_TABLET | Freq: Four times a day (QID) | ORAL | Status: DC | PRN
  Administered 2012-04-02: 1 mg via ORAL
  Filled 2012-04-02: qty 1

## 2012-04-02 MED ORDER — ACETAMINOPHEN 325 MG PO TABS: 650.0000 mg | ORAL_TABLET | ORAL | Status: DC | PRN

## 2012-04-02 MED ORDER — ZOLPIDEM TARTRATE 5 MG PO TABS: 5.0000 mg | ORAL_TABLET | Freq: Every evening | ORAL | Status: DC | PRN

## 2012-04-02 MED ORDER — IBUPROFEN 600 MG PO TABS: 600.0000 mg | ORAL_TABLET | Freq: Three times a day (TID) | ORAL | Status: DC | PRN

## 2012-04-02 MED ORDER — LORAZEPAM 1 MG PO TABS: 0.0000 mg | ORAL_TABLET | Freq: Two times a day (BID) | ORAL | Status: DC

## 2012-04-02 MED ORDER — LORAZEPAM 1 MG PO TABS
0.0000 mg | ORAL_TABLET | Freq: Four times a day (QID) | ORAL | Status: DC
  Administered 2012-04-02: 1 mg via ORAL
  Filled 2012-04-02: qty 1

## 2012-04-02 MED ORDER — ONDANSETRON HCL 4 MG PO TABS: 4.0000 mg | ORAL_TABLET | Freq: Three times a day (TID) | ORAL | Status: DC | PRN

## 2012-04-02 MED ORDER — THIAMINE HCL 100 MG/ML IJ SOLN: 100.0000 mg | Freq: Every day | INTRAMUSCULAR | Status: DC

## 2012-04-02 MED ORDER — VITAMIN B-1 100 MG PO TABS
100.0000 mg | ORAL_TABLET | Freq: Every day | ORAL | Status: DC
  Administered 2012-04-02: 100 mg via ORAL
  Filled 2012-04-02: qty 1

## 2012-04-02 NOTE — BH Assessment (Signed)
Assessment Note  Patient is a 51 year-old male requesting detox from alcohol.  Patient reports that he drank two 40 ounce of beers today.   Patient's BAL was 363 when he was admitted into the ER on 04-01-2012 at 8:30 p.m.  Patient denies other drug use.  However, his UDS was positive for cocaine. Patient denies any SI/HI.  Patient denies any psychosis.  Patient reports feelings of depression and hopelessness associated with his inability to stop using.  Patient reports the following withdrawal symptoms shaking, slight headache and nausea. Patient has a CIWA score of 6.   Patient reports that he drinks 5 quarts of Old English daily.  Patient reported that he had been addicted to alcohol since he was 51 years old.  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 Anxiety   . Depression   . COPD (chronic obstructive pulmonary disease)   . Seizures     last seizure 2 months ago   Axis IV: economic problems, educational problems, housing problems, occupational problems, problems related to legal system/crime, problems related to social environment, problems with access to health care services and problems with primary support group Axis V: 41-50 serious symptoms  Past Medical History:  Past Medical History  Diagnosis Date  . Anxiety   . Depression   . COPD (chronic obstructive pulmonary disease)   . Seizures     last seizure 2 months ago    Past Surgical History  Procedure Date  . No past surgeries     Family History: No family history on file.  Social History:  reports that he has been smoking Cigarettes.  He has a 18 pack-year smoking history. He has never used smokeless tobacco. He reports that he drinks alcohol. He reports that he uses illicit drugs ("Crack" cocaine).  Additional Social History:  Alcohol / Drug Use History of alcohol  / drug use?: Yes Substance #1 Name of Substance 1: Beer - Malt Liquor  1 - Age of First Use: 14 1 - Amount (size/oz): 2 (40oz) beer  1 - Frequency: 5 quarts  1 - Duration: Daily  1 - Last Use / Amount: yesterday  Substance #2 Name of Substance 2: Crack/Cocaine  2 - Age of First Use: denies  using  2 - Amount (size/oz): denies using  2 - Frequency: denies using  2 - Duration: denies using  2 - Last Use / Amount: denies using   CIWA: CIWA-Ar BP: 104/52 mmHg Pulse Rate: 72  Nausea and Vomiting: mild nausea with no vomiting Tactile Disturbances: very mild itching, pins and needles, burning or numbness Tremor: no tremor Auditory Disturbances: not present Paroxysmal Sweats: barely perceptible sweating, palms moist Visual Disturbances: not present Anxiety: mildly anxious Headache, Fullness in Head: very mild Agitation: somewhat more than normal activity Orientation and Clouding of Sensorium: oriented and can do serial additions CIWA-Ar Total: 6  COWS:    Allergies:  Allergies  Allergen Reactions  . Peanut-Containing Drug Products Hives    Home Medications:  (Not in a hospital admission)  OB/GYN Status:  No LMP for male patient.  General Assessment Data Location of Assessment: [facility] ACT Assessment: Yes Living Arrangements: Parent Can pt return to current living arrangement?: Yes Admission Status: Voluntary Is patient capable of signing voluntary admission?: Yes Transfer from: Acute Hospital Referral Source: Self/Family/Friend  Education Status Is patient currently in school?: No  Risk to self Suicidal Ideation: No Suicidal Intent: No Is patient at risk for suicide?: No Suicidal Plan?: No Specify Current Suicidal Plan: N/A Access to Means: No Specify Access to Suicidal Means: N/A What has been your use of drugs/alcohol within the last 12 months?: alcohol Previous Attempts/Gestures: No How many times?: 0  Other Self Harm Risks: N/A Triggers for Past Attempts:  None known;Unpredictable Intentional Self Injurious Behavior: None Family Suicide History: No Recent stressful life event(s): Other (Comment) Persecutory voices/beliefs?: No Depression: Yes Depression Symptoms: Isolating;Guilt;Loss of interest in usual pleasures;Feeling worthless/self pity Suicide prevention information given to non-admitted patients: Not applicable  Risk to Others Homicidal Ideation: No Thoughts of Harm to Others: No Current Homicidal Intent: No Current Homicidal Plan: No Access to Homicidal Means: No Identified Victim: N/A History of harm to others?: No Assessment of Violence: None Noted Violent Behavior Description: cooperative Does patient have access to weapons?: No Criminal Charges Pending?: No Describe Pending Criminal Charges:  (N/A) Does patient have a court date: No  Psychosis Hallucinations: None noted Delusions: None noted  Mental Status Report Appear/Hygiene: Body odor;Disheveled Eye Contact: Fair Motor Activity: Freedom of movement Speech: Logical/coherent Level of Consciousness: Quiet/awake Mood: Depressed;Anxious Affect: Appropriate to circumstance Anxiety Level: None Panic attack frequency: None Most recent panic attack: None Thought Processes: Coherent;Relevant Judgement: Unimpaired Orientation: Person;Place;Time;Situation Obsessive Compulsive Thoughts/Behaviors: None  Cognitive Functioning Concentration: Decreased Memory: Recent Intact;Remote Intact IQ: Average Insight: Fair Impulse Control: Poor Appetite: Good Weight Loss: 0  Weight Gain: 0  Sleep: Decreased Total Hours of Sleep: 5  Vegetative Symptoms: None  ADLScreening [facility]) Patient's cognitive ability adequate to safely complete daily activities?: Yes Patient able to express need for assistance with ADLs?: Yes Independently performs ADLs?: Yes  Abuse/Neglect [facility]) Physical Abuse: Denies Verbal Abuse: Denies Sexual Abuse: Denies  Prior  Inpatient Therapy Prior Inpatient Therapy: Yes Prior Therapy Dates:  (02-2012) Prior Therapy Facilty/Provider(s): [facility] Reason for Treatment: Depression and Detox  Prior Outpatient Therapy Prior Outpatient Therapy: No Prior Therapy Dates: na Prior Therapy Facilty/Provider(s): na Reason for Treatment: na  ADL Screening (condition at time of admission) Patient's cognitive ability adequate to safely complete daily activities?: Yes Patient able to express need for assistance with ADLs?: Yes Independently performs ADLs?: Yes       Abuse/Neglect Assessment (Assessment to be complete while patient is alone) Physical Abuse: Denies Verbal Abuse: Denies Sexual Abuse: Denies Values / Beliefs Cultural Requests During Hospitalization: None Spiritual Requests During Hospitalization: None        Additional Information 1:1 In Past 12 Months?: No CIRT Risk: No Elopement Risk: No Does patient have medical clearance?: Yes     Disposition: Patient pending placement at [facility] .  Disposition Disposition of Patient: Referred to Type of inpatient treatment program: Adult Patient referred to: [facility]  On Site Evaluation by:   Reviewed with Physician:     Phillip Heal LaVerne 04/02/2012 3:42 AM

## 2012-04-02 NOTE — ED Provider Notes (Signed)
He has been accepted at [facility] and will be discharged.  Dione Booze, MD 04/02/12 [phone]

## 2012-04-02 NOTE — Progress Notes (Signed)
[facility] ED CM consulted by ACT member, [place] for medication assistance for [facility] d/c.  CM received Rx for Tegretol 200 mg tid from Dr Preston Fleeting and tubed to [facility] (courtney in [facility] pharmacy confirmed pt is eligible for [facility] indigent medication assistance program) for processing [employer] pharmacy staff directed to contact psych ED RN, Joni Reining when medications are ready

## 2012-04-02 NOTE — ED Notes (Signed)
Pt discharged and picked up by driver en route to [facility]. Departs in safe and stable condition.

## 2012-04-02 NOTE — Progress Notes (Signed)
CSW called [facility] who advised patient must turn in information showing he went to court yesterday. CSW spoke with patient who advised he doesn't have anything in writing, but he does have his lawyer's business card. CSW asked nurse to instruct patient to call attorney and have attny fax paperwork to [facility].   Manson Passey Diarra Ceja ANN S , MSW, LCSW 04/02/2012 1:58 PM [phone]

## 2012-04-02 NOTE — ED Notes (Signed)
Pt accepted to [facility] [place] [facility] reports that [facility] transport will be here to pick pt up at 2030. Pt has medications from pharmacy and 2 belonging bags to go with him  Pt has signed D/c instructions.

## 2012-08-29 ENCOUNTER — Emergency Department (HOSPITAL_COMMUNITY)
Admission: EM | Admit: 2012-08-29 | Discharge: 2012-08-29 | Disposition: A | Discharge status: Home / Self Care | Payer: Medicaid Other | Attending: Emergency Medicine | Admitting: Emergency Medicine

## 2012-08-29 ENCOUNTER — Emergency Department (HOSPITAL_COMMUNITY): Payer: Medicaid Other

## 2012-08-29 ENCOUNTER — Encounter (HOSPITAL_COMMUNITY): Payer: Self-pay | Admitting: Emergency Medicine

## 2012-08-29 DIAGNOSIS — Z8673 Personal history of transient ischemic attack (TIA), and cerebral infarction without residual deficits: Secondary | ICD-10-CM | POA: Insufficient documentation

## 2012-08-29 DIAGNOSIS — Z8659 Personal history of other mental and behavioral disorders: Secondary | ICD-10-CM | POA: Insufficient documentation

## 2012-08-29 DIAGNOSIS — S93609A Unspecified sprain of unspecified foot, initial encounter: Secondary | ICD-10-CM | POA: Insufficient documentation

## 2012-08-29 DIAGNOSIS — F1410 Cocaine abuse, uncomplicated: Secondary | ICD-10-CM | POA: Insufficient documentation

## 2012-08-29 DIAGNOSIS — J4489 Other specified chronic obstructive pulmonary disease: Secondary | ICD-10-CM | POA: Insufficient documentation

## 2012-08-29 DIAGNOSIS — Z8669 Personal history of other diseases of the nervous system and sense organs: Secondary | ICD-10-CM | POA: Insufficient documentation

## 2012-08-29 DIAGNOSIS — Y929 Unspecified place or not applicable: Secondary | ICD-10-CM | POA: Insufficient documentation

## 2012-08-29 DIAGNOSIS — F1910 Other psychoactive substance abuse, uncomplicated: Secondary | ICD-10-CM | POA: Insufficient documentation

## 2012-08-29 DIAGNOSIS — Y9389 Activity, other specified: Secondary | ICD-10-CM | POA: Insufficient documentation

## 2012-08-29 DIAGNOSIS — F17200 Nicotine dependence, unspecified, uncomplicated: Secondary | ICD-10-CM | POA: Insufficient documentation

## 2012-08-29 DIAGNOSIS — F1010 Alcohol abuse, uncomplicated: Secondary | ICD-10-CM | POA: Insufficient documentation

## 2012-08-29 DIAGNOSIS — X58XXXA Exposure to other specified factors, initial encounter: Secondary | ICD-10-CM | POA: Insufficient documentation

## 2012-08-29 DIAGNOSIS — J449 Chronic obstructive pulmonary disease, unspecified: Secondary | ICD-10-CM | POA: Insufficient documentation

## 2012-08-29 HISTORY — DX: Cocaine abuse, uncomplicated: F14.10

## 2012-08-29 HISTORY — DX: Cerebral infarction, unspecified: I63.9

## 2012-08-29 HISTORY — DX: Alcohol abuse, uncomplicated: F10.10

## 2012-08-29 LAB — URINALYSIS, ROUTINE W REFLEX MICROSCOPIC
Bilirubin Urine: NEGATIVE
Hgb urine dipstick: NEGATIVE
Ketones, ur: NEGATIVE mg/dL
Nitrite: NEGATIVE
Protein, ur: NEGATIVE mg/dL
Urobilinogen, UA: 0.2 mg/dL (ref 0.0–1.0)

## 2012-08-29 NOTE — ED Provider Notes (Signed)
History     CSN: [id number]  Arrival date & time 09/16/12  0007   None     Chief Complaint  Patient presents with  . Foot Pain    (Consider location/radiation/quality/duration/timing/severity/associated sxs/prior treatment) HPI This is a 52 year old male with a history of polysubstance abuse and seizures. And injured his right foot. He is having mild to moderate pain on the dorsum of his right foot, worse with ambulation. He is able to ambulate. There is no associated deformity or swelling.  He states he is not taking any medications for his seizures. He does admit to alcohol use.  Past Medical History  Diagnosis Date  . Anxiety   . Depression   . COPD (chronic obstructive pulmonary disease)   . Seizures     last seizure 2 months ago  . Stroke     2009  . Alcohol abuse   . Cocaine abuse     Past Surgical History  Procedure Date  . No past surgeries     No family history on file.  History  Substance Use Topics  . Smoking status: Current Every Day Smoker -- 1.0 packs/day for 18 years    Types: Cigarettes  . Smokeless tobacco: Never Used  . Alcohol Use: Yes     Comment: 4 qts/day       Review of Systems  All other systems reviewed and are negative.    Allergies  Peanut-containing drug products and Tramadol  Home Medications  No current outpatient prescriptions on file.  BP 100/71  Pulse 86  Temp 98.3 F (36.8 C) (Oral)  Resp 15  Ht 6\' 1"  (1.854 m)  Wt 152 lb (68.947 kg)  BMI 20.05 kg/m2  SpO2 96%  Physical Exam General: Well-developed, thin male in no acute distress; appears older than age of record HENT: normocephalic, atraumatic; breath smells of alcohol Eyes: pupils equal round and reactive to light; extraocular muscles intact Neck: supple Heart: regular rate and rhythm Lungs: clear to auscultation bilaterally Abdomen: soft; nondistended Extremities: No deformity; full range of motion; pulses normal; mild tenderness over dorsal right  foot Neurologic: Awake, alert; motor function intact in all extremities and symmetric; no facial droop; dysarthria; ataxia Skin: Warm and dry    ED Course  Procedures (including critical care time)    MDM   Nursing notes and vitals signs, including pulse oximetry, reviewed.  Summary of this visit's results, reviewed by myself:  Labs:  Results for orders placed during the hospital encounter of Sep 16, 2012 (from the past 24 hour(s))  URINALYSIS, ROUTINE W REFLEX MICROSCOPIC     Status: Abnormal   Collection Time   September 16, 2012  2:44 AM      Component Value Range   Color, Urine YELLOW  YELLOW   APPearance CLEAR  CLEAR   Specific Gravity, Urine 1.002 (*) 1.005 - 1.030   pH 6.0  5.0 - 8.0   Glucose, UA NEGATIVE  NEGATIVE mg/dL   Hgb urine dipstick NEGATIVE  NEGATIVE   Bilirubin Urine NEGATIVE  NEGATIVE   Ketones, ur NEGATIVE  NEGATIVE mg/dL   Protein, ur NEGATIVE  NEGATIVE mg/dL   Urobilinogen, UA 0.2  0.0 - 1.0 mg/dL   Nitrite NEGATIVE  NEGATIVE   Leukocytes, UA NEGATIVE  NEGATIVE    Imaging Studies: Dg Foot Complete Right  16-Sep-2012  *RADIOLOGY REPORT*  Clinical Data: Seizure and fall; right foot pain.  Dorsal foot erythema.  RIGHT FOOT COMPLETE - 3+ VIEW  Comparison: None.  Findings: There is no  evidence of fracture or dislocation.  The joint spaces are preserved.  There is no evidence of talar subluxation; the subtalar joint is unremarkable in appearance.  An os naviculare is incidentally noted.  An os peroneum is also seen. A small posterior calcaneal spur is noted.  No significant soft tissue abnormalities are seen.  IMPRESSION:  1.  No evidence of fracture or dislocation. 2.  Os naviculare and os peroneum seen.   Original Report Authenticated By: Tonia Ghent, M.D.             Hanley Seamen, MD 08/29/12 [phone]

## 2012-08-29 NOTE — ED Notes (Signed)
Pt c/o pain to bilat feet. Pt states while attempting to walk to store tonight pt states his feet are not moving as fast as normal. Neuro intact. Pt states he feels weaker than normal.

## 2012-10-12 ENCOUNTER — Encounter (HOSPITAL_COMMUNITY): Payer: Self-pay | Admitting: Emergency Medicine

## 2012-10-12 ENCOUNTER — Emergency Department (HOSPITAL_COMMUNITY)
Admission: EM | Admit: 2012-10-12 | Discharge: 2012-10-12 | Disposition: A | Discharge status: Home / Self Care | Payer: Medicaid Other | Attending: Emergency Medicine | Admitting: Emergency Medicine

## 2012-10-12 ENCOUNTER — Emergency Department (HOSPITAL_COMMUNITY): Payer: Medicaid Other

## 2012-10-12 DIAGNOSIS — R509 Fever, unspecified: Secondary | ICD-10-CM | POA: Insufficient documentation

## 2012-10-12 DIAGNOSIS — F411 Generalized anxiety disorder: Secondary | ICD-10-CM | POA: Insufficient documentation

## 2012-10-12 DIAGNOSIS — Z8669 Personal history of other diseases of the nervous system and sense organs: Secondary | ICD-10-CM | POA: Insufficient documentation

## 2012-10-12 DIAGNOSIS — F17200 Nicotine dependence, unspecified, uncomplicated: Secondary | ICD-10-CM | POA: Insufficient documentation

## 2012-10-12 DIAGNOSIS — Z79899 Other long term (current) drug therapy: Secondary | ICD-10-CM | POA: Insufficient documentation

## 2012-10-12 DIAGNOSIS — F329 Major depressive disorder, single episode, unspecified: Secondary | ICD-10-CM | POA: Insufficient documentation

## 2012-10-12 DIAGNOSIS — J441 Chronic obstructive pulmonary disease with (acute) exacerbation: Secondary | ICD-10-CM | POA: Insufficient documentation

## 2012-10-12 DIAGNOSIS — J3489 Other specified disorders of nose and nasal sinuses: Secondary | ICD-10-CM | POA: Insufficient documentation

## 2012-10-12 DIAGNOSIS — R5381 Other malaise: Secondary | ICD-10-CM | POA: Insufficient documentation

## 2012-10-12 DIAGNOSIS — Z8673 Personal history of transient ischemic attack (TIA), and cerebral infarction without residual deficits: Secondary | ICD-10-CM | POA: Insufficient documentation

## 2012-10-12 DIAGNOSIS — F3289 Other specified depressive episodes: Secondary | ICD-10-CM | POA: Insufficient documentation

## 2012-10-12 DIAGNOSIS — IMO0002 Reserved for concepts with insufficient information to code with codable children: Secondary | ICD-10-CM | POA: Insufficient documentation

## 2012-10-12 MED ORDER — PREDNISONE 20 MG PO TABS
60.0000 mg | ORAL_TABLET | Freq: Once | ORAL | Status: AC
  Administered 2012-10-12: 60 mg via ORAL
  Filled 2012-10-12: qty 3

## 2012-10-12 MED ORDER — ALBUTEROL SULFATE (5 MG/ML) 0.5% IN NEBU
5.0000 mg | INHALATION_SOLUTION | Freq: Once | RESPIRATORY_TRACT | Status: AC
  Administered 2012-10-12: 5 mg via RESPIRATORY_TRACT
  Filled 2012-10-12: qty 1

## 2012-10-12 MED ORDER — ALBUTEROL SULFATE HFA 108 (90 BASE) MCG/ACT IN AERS
2.0000 | INHALATION_SPRAY | Freq: Once | RESPIRATORY_TRACT | Status: AC
  Administered 2012-10-12: 2 via RESPIRATORY_TRACT
  Filled 2012-10-12: qty 6.7

## 2012-10-12 MED ORDER — IPRATROPIUM BROMIDE 0.02 % IN SOLN
0.5000 mg | Freq: Once | RESPIRATORY_TRACT | Status: AC
  Administered 2012-10-12: 0.5 mg via RESPIRATORY_TRACT
  Filled 2012-10-12: qty 2.5

## 2012-10-12 MED ORDER — PREDNISONE 20 MG PO TABS: 40.0000 mg | ORAL_TABLET | Freq: Every day | ORAL | Status: DC

## 2012-10-12 NOTE — ED Notes (Signed)
Per EMS pt. Is from home with complaint of flu like symptoms,with  Productive cough and fever, also complaint of generalized weakness that has been for 2 days now. Also reports of chest congestion.

## 2012-10-12 NOTE — ED Notes (Signed)
Bed:WA07<BR> Expected date:<BR> Expected time:<BR> Means of arrival:<BR> Comments:<BR>

## 2012-10-16 NOTE — ED Provider Notes (Addendum)
History    Mario Andrews with cough and congestion. Gradual onset about 2 days ago. Symptoms stable. Feels generally tired. Subjective fever. Feels achy. No sick contacts. No n/v. No cp. No unusual leg pain or swelling. No sick contacts.  CSN: [id number]  Arrival date & time 10/12/12  2044   First MD Initiated Contact with Patient 10/12/12 2048      Chief Complaint  Patient presents with  . Cough  . Fever  . Nasal Congestion  . Weakness    (Consider location/radiation/quality/duration/timing/severity/associated sxs/prior treatment) HPI  Past Medical History  Diagnosis Date  . Anxiety   . Depression   . COPD (chronic obstructive pulmonary disease)   . Seizures     last seizure 2 months ago  . Stroke     2009  . Alcohol abuse   . Cocaine abuse     Past Surgical History  Procedure Laterality Date  . No past surgeries      History reviewed. No pertinent family history.  History  Substance Use Topics  . Smoking status: Current Every Day Smoker -- 1.00 packs/day for 18 years    Types: Cigarettes  . Smokeless tobacco: Never Used  . Alcohol Use: Yes     Comment: 4 qts/day       Review of Systems  All systems reviewed and negative, other than as noted in HPI.   Allergies  Peanut-containing drug products and Tramadol  Home Medications   Current Outpatient Rx  Name  Route  Sig  Dispense  Refill  . albuterol (PROVENTIL HFA;VENTOLIN HFA) 108 (90 BASE) MCG/ACT inhaler   Inhalation   Inhale 2 puffs into the lungs every 6 (six) hours as needed for wheezing.         . butalbital-acetaminophen-caffeine (FIORICET) 50-325-40 MG per tablet   Oral   Take 1 tablet by mouth 2 (two) times daily as needed for headache.         . DULoxetine (CYMBALTA) 30 MG capsule   Oral   Take 30 mg by mouth at bedtime.         . Ferrous Sulfate (IRON) 325 (65 FE) MG TABS   Oral   Take by mouth.         . hydrOXYzine (VISTARIL) 25 MG capsule   Oral   Take 25 mg by mouth 3  (three) times daily as needed for itching.         Marland Kitchen oxyCODONE-acetaminophen (PERCOCET/ROXICET) 5-325 MG per tablet   Oral   Take 1 tablet by mouth every 4 (four) hours as needed for pain.         . traMADol (ULTRAM) 50 MG tablet   Oral   Take 50 mg by mouth every 6 (six) hours as needed for pain.         . predniSONE (DELTASONE) 20 MG tablet   Oral   Take 2 tablets (40 mg total) by mouth daily.   10 tablet   0     BP 117/75  Pulse 98  Temp(Src) 98.6 F (37 C) (Oral)  Resp 19  SpO2 98%  Physical Exam  Nursing note and vitals reviewed. Constitutional: He appears well-developed and well-nourished. No distress.  HENT:  Head: Normocephalic and atraumatic.  Eyes: Conjunctivae are normal. Right eye exhibits no discharge. Left eye exhibits no discharge.  Neck: Neck supple.  Cardiovascular: Normal rate, regular rhythm and normal heart sounds.  Exam reveals no gallop and no friction rub.   No murmur heard. Pulmonary/Chest:  Effort normal. No respiratory distress. He has wheezes.  Abdominal: Soft. He exhibits no distension. There is no tenderness.  Musculoskeletal: He exhibits no edema and no tenderness.  Lower extremities symmetric as compared to each other. No calf tenderness. Negative Homan's. No palpable cords.   Neurological: He is alert.  Skin: Skin is warm and dry.  Psychiatric: He has a normal mood and affect. His behavior is normal. Thought content normal.    ED Course  Procedures (including critical care time)  Labs Reviewed - No data to display No results found. Dg Chest 2 View  10/12/2012  *RADIOLOGY REPORT*  Clinical Data: Fever and cough  CHEST - 2 VIEW  Comparison: 04/01/2012  Findings: Heart size is normal.  No pleural effusion or edema. The lungs appear hyperinflated but clear.  No airspace consolidation identified.  Review of the visualized bony structures is unremarkable.  IMPRESSION:  1.  No acute cardiopulmonary abnormalities.   Original Report  Authenticated By: Signa Kell, M.D.    EKG:  Rhythm: normal sinus Vent. rate 72 BPM PR interval 192 ms QRS duration 82 ms QT/QTc 412/451 ms ST segments: Diffuse STE, likely BER   1. COPD exacerbation       MDM  Mario Andrews with cough and congestion. Consistent with mild copd exacerbation. midl wheezing on exam but not increased work of breathing and o2 sats fine. CXR clear. Plan course of steroids. Return precautions discused.         Raeford Razor, MD 10/17/12 1610  Raeford Razor, MD 10/17/12 [phone]

## 2013-05-02 ENCOUNTER — Encounter (HOSPITAL_COMMUNITY): Payer: Self-pay

## 2013-05-02 ENCOUNTER — Emergency Department (HOSPITAL_COMMUNITY)
Admission: EM | Admit: 2013-05-02 | Discharge: 2013-05-03 | Disposition: A | Discharge status: Home / Self Care | Payer: Medicaid Other | Attending: Emergency Medicine | Admitting: Emergency Medicine

## 2013-05-02 DIAGNOSIS — L729 Follicular cyst of the skin and subcutaneous tissue, unspecified: Secondary | ICD-10-CM

## 2013-05-02 DIAGNOSIS — F17200 Nicotine dependence, unspecified, uncomplicated: Secondary | ICD-10-CM | POA: Insufficient documentation

## 2013-05-02 DIAGNOSIS — IMO0002 Reserved for concepts with insufficient information to code with codable children: Secondary | ICD-10-CM | POA: Insufficient documentation

## 2013-05-02 DIAGNOSIS — Z79899 Other long term (current) drug therapy: Secondary | ICD-10-CM | POA: Insufficient documentation

## 2013-05-02 DIAGNOSIS — L723 Sebaceous cyst: Secondary | ICD-10-CM | POA: Insufficient documentation

## 2013-05-02 NOTE — ED Notes (Signed)
Pt reports he has had a cyst x5 years to his upper back, pt states that when he eats he gets sick because the food pushes the cyst down into his back. Pt states "I know it's puss and poison in it and I need someone to cut it out."

## 2013-05-02 NOTE — ED Notes (Signed)
Per [facility] pt from from home, pt c/o cyst to his back x5 years. VSS BP 124/76, HR 80, 96% RA, RR 18

## 2013-05-03 NOTE — ED Notes (Signed)
approx 2.5cm x 2 cm bump in mid upper back.  Pt feels it is making him ill and is the reason he cannot eat.

## 2013-05-03 NOTE — ED Provider Notes (Signed)
Medical screening examination/treatment/procedure(s) were performed by non-physician practitioner and as supervising physician I was immediately available for consultation/collaboration.   Junius Argyle, MD 05/03/13 [phone]

## 2013-05-03 NOTE — ED Provider Notes (Signed)
CSN: [id number]     Arrival date & time 05/02/13  2321 History   None    Chief Complaint  Patient presents with  . Cyst   (Consider location/radiation/quality/duration/timing/severity/associated sxs/prior Treatment) HPI Mario Andrews is a 52 y.o. male presents emergency department with complaint of a mass on his back. Patient states he has had it on his back for 5 years. The patient states that it has not changed in size. He states it's not tender. Denies any fever, chills, malaise. Patient states "I wanted off of my back." Patient did come in by implants. Patient states "someone told her there is poison in it and it needs to come out." He said has not tried any treatment on it. Patient has not seen anyone for this.    Past Surgical History  Procedure Laterality Date  . No past surgeries     History reviewed. No pertinent family history. History  Substance Use Topics  . Smoking status: Current Every Day Smoker -- 1.00 packs/day for 18 years    Types: Cigarettes  . Smokeless tobacco: Never Used  . Alcohol Use: Yes     Comment: 4 qts/day     Review of Systems  Constitutional: Negative for fever and chills.  Skin: Positive for wound.    Allergies  Peanut-containing drug products and Tramadol  Home Medications   Current Outpatient Rx  Name  Route  Sig  Dispense  Refill  . albuterol (PROVENTIL HFA;VENTOLIN HFA) 108 (90 BASE) MCG/ACT inhaler   Inhalation   Inhale 2 puffs into the lungs every 6 (six) hours as needed for wheezing.         . butalbital-acetaminophen-caffeine (FIORICET) 50-325-40 MG per tablet   Oral   Take 1 tablet by mouth 2 (two) times daily as needed for headache.         . DULoxetine (CYMBALTA) 30 MG capsule   Oral   Take 30 mg by mouth at bedtime.         . Ferrous Sulfate (IRON) 325 (65 FE) MG TABS   Oral   Take by mouth.         . hydrOXYzine (VISTARIL) 25 MG capsule   Oral   Take 25 mg by mouth 3 (three) times daily as needed for  itching.         Marland Kitchen oxyCODONE-acetaminophen (PERCOCET/ROXICET) 5-325 MG per tablet   Oral   Take 1 tablet by mouth every 4 (four) hours as needed for pain.         . predniSONE (DELTASONE) 20 MG tablet   Oral   Take 2 tablets (40 mg total) by mouth daily.   10 tablet   0   . traMADol (ULTRAM) 50 MG tablet   Oral   Take 50 mg by mouth every 6 (six) hours as needed for pain.          BP 115/79  Pulse 74  Temp(Src) 98 F (36.7 C) (Oral)  Resp 16  SpO2 98% Physical Exam  Nursing note and vitals reviewed. Constitutional: He appears well-developed and well-nourished. No distress.  HENT:  Head: Normocephalic and atraumatic.  Eyes: Conjunctivae are normal.  Neck: Neck supple.  Musculoskeletal: He exhibits no edema.  Neurological: He is alert.  Skin: Skin is warm and dry.  2 x 2 centimeter cysts in the mid upper back. Cyst is not erythematous, is nontender. It is soft. There is no surrounding skin erythema or tenderness.    ED Course  Procedures (  including critical care time) Labs Review Labs Reviewed - No data to display Imaging Review No results found.  MDM   1. Cyst of skin      Patient with a cyst in the upper back that has been there for 5 years. Does not appear to be infected. Is nontender. Is not erythematous. It does not feel warm to touch. Patient is afebrile. I have reassured him. No treatment at this time, will refer to general surgery for removal as needed.   Filed Vitals:   05/02/13 2337  BP: 115/79  Pulse: 74  Temp: 98 F (36.7 C)  TempSrc: Oral  Resp: 16  SpO2: 98%       Adra Shepler A Raechal Raben, PA-C 05/03/13 0016

## 2013-06-11 ENCOUNTER — Ambulatory Visit (INDEPENDENT_AMBULATORY_CARE_PROVIDER_SITE_OTHER): Payer: Medicaid Other | Admitting: Surgery

## 2013-06-12 ENCOUNTER — Encounter (INDEPENDENT_AMBULATORY_CARE_PROVIDER_SITE_OTHER): Payer: Self-pay | Admitting: Surgery

## 2013-07-02 ENCOUNTER — Encounter (HOSPITAL_COMMUNITY): Payer: Self-pay | Admitting: Emergency Medicine

## 2013-07-02 ENCOUNTER — Emergency Department (HOSPITAL_COMMUNITY)
Admission: EM | Admit: 2013-07-02 | Discharge: 2013-07-03 | Disposition: A | Discharge status: Home / Self Care | Payer: Medicaid Other | Attending: Emergency Medicine | Admitting: Emergency Medicine

## 2013-07-02 DIAGNOSIS — J4489 Other specified chronic obstructive pulmonary disease: Secondary | ICD-10-CM | POA: Insufficient documentation

## 2013-07-02 DIAGNOSIS — F1014 Alcohol abuse with alcohol-induced mood disorder: Secondary | ICD-10-CM

## 2013-07-02 DIAGNOSIS — F3289 Other specified depressive episodes: Secondary | ICD-10-CM | POA: Insufficient documentation

## 2013-07-02 DIAGNOSIS — F1010 Alcohol abuse, uncomplicated: Secondary | ICD-10-CM | POA: Insufficient documentation

## 2013-07-02 DIAGNOSIS — Z79899 Other long term (current) drug therapy: Secondary | ICD-10-CM | POA: Insufficient documentation

## 2013-07-02 DIAGNOSIS — J449 Chronic obstructive pulmonary disease, unspecified: Secondary | ICD-10-CM | POA: Insufficient documentation

## 2013-07-02 DIAGNOSIS — Z8669 Personal history of other diseases of the nervous system and sense organs: Secondary | ICD-10-CM | POA: Insufficient documentation

## 2013-07-02 DIAGNOSIS — F10929 Alcohol use, unspecified with intoxication, unspecified: Secondary | ICD-10-CM

## 2013-07-02 DIAGNOSIS — F10988 Alcohol use, unspecified with other alcohol-induced disorder: Secondary | ICD-10-CM | POA: Insufficient documentation

## 2013-07-02 DIAGNOSIS — Z8673 Personal history of transient ischemic attack (TIA), and cerebral infarction without residual deficits: Secondary | ICD-10-CM | POA: Insufficient documentation

## 2013-07-02 DIAGNOSIS — F17200 Nicotine dependence, unspecified, uncomplicated: Secondary | ICD-10-CM | POA: Insufficient documentation

## 2013-07-02 DIAGNOSIS — F411 Generalized anxiety disorder: Secondary | ICD-10-CM | POA: Insufficient documentation

## 2013-07-02 DIAGNOSIS — F329 Major depressive disorder, single episode, unspecified: Secondary | ICD-10-CM | POA: Insufficient documentation

## 2013-07-02 LAB — CBC
MCV: 86 fL (ref 78.0–100.0)
Platelets: 148 10*3/uL — ABNORMAL LOW (ref 150–400)
RDW: 14.3 % (ref 11.5–15.5)
WBC: 4.6 10*3/uL (ref 4.0–10.5)
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
## 2013-07-03 LAB — COMPREHENSIVE METABOLIC PANEL
AST: 170 U/L — ABNORMAL HIGH (ref 0–37)
Albumin: 3.7 g/dL (ref 3.5–5.2)
Calcium: 8.8 mg/dL (ref 8.4–10.5)
Chloride: 101 mEq/L (ref 96–112)
Creatinine, Ser: 0.69 mg/dL (ref 0.50–1.35)

## 2013-07-03 LAB — SALICYLATE LEVEL: Salicylate Lvl: <2 mg/dL — ABNORMAL LOW (ref 2.8–20.0)

## 2013-07-03 NOTE — ED Notes (Signed)
Pt resting quietly; resps even and nonlabored; blankets on patient for comfort

## 2013-07-15 ENCOUNTER — Emergency Department (HOSPITAL_COMMUNITY)
Admission: EM | Admit: 2013-07-15 | Discharge: 2013-07-15 | Disposition: A | Discharge status: Home / Self Care | Payer: Medicaid Other | Attending: Emergency Medicine | Admitting: Emergency Medicine

## 2013-07-15 ENCOUNTER — Encounter (HOSPITAL_COMMUNITY): Payer: Self-pay | Admitting: Emergency Medicine

## 2013-07-15 DIAGNOSIS — F411 Generalized anxiety disorder: Secondary | ICD-10-CM | POA: Insufficient documentation

## 2013-07-15 DIAGNOSIS — Z79899 Other long term (current) drug therapy: Secondary | ICD-10-CM | POA: Insufficient documentation

## 2013-07-15 DIAGNOSIS — F17200 Nicotine dependence, unspecified, uncomplicated: Secondary | ICD-10-CM | POA: Insufficient documentation

## 2013-07-15 DIAGNOSIS — F10229 Alcohol dependence with intoxication, unspecified: Secondary | ICD-10-CM | POA: Insufficient documentation

## 2013-07-15 DIAGNOSIS — J4489 Other specified chronic obstructive pulmonary disease: Secondary | ICD-10-CM | POA: Insufficient documentation

## 2013-07-15 DIAGNOSIS — F1020 Alcohol dependence, uncomplicated: Secondary | ICD-10-CM

## 2013-07-15 DIAGNOSIS — Z8669 Personal history of other diseases of the nervous system and sense organs: Secondary | ICD-10-CM | POA: Insufficient documentation

## 2013-07-15 DIAGNOSIS — F10929 Alcohol use, unspecified with intoxication, unspecified: Secondary | ICD-10-CM

## 2013-07-15 DIAGNOSIS — J449 Chronic obstructive pulmonary disease, unspecified: Secondary | ICD-10-CM | POA: Insufficient documentation

## 2013-07-15 DIAGNOSIS — Z8673 Personal history of transient ischemic attack (TIA), and cerebral infarction without residual deficits: Secondary | ICD-10-CM | POA: Insufficient documentation

## 2013-07-15 LAB — URINALYSIS, ROUTINE W REFLEX MICROSCOPIC
Bilirubin Urine: NEGATIVE
Glucose, UA: NEGATIVE mg/dL
Hgb urine dipstick: NEGATIVE
Ketones, ur: NEGATIVE mg/dL
Leukocytes, UA: NEGATIVE
Nitrite: NEGATIVE
Protein, ur: NEGATIVE mg/dL
Specific Gravity, Urine: 1.016 (ref 1.005–1.030)
Urobilinogen, UA: 0.2 mg/dL (ref 0.0–1.0)
pH: 5.5 (ref 5.0–8.0)

## 2013-07-15 LAB — COMPREHENSIVE METABOLIC PANEL
ALT: 66 U/L — ABNORMAL HIGH (ref 0–53)
AST: 130 U/L — ABNORMAL HIGH (ref 0–37)
Albumin: 3.8 g/dL (ref 3.5–5.2)
Alkaline Phosphatase: 96 U/L (ref 39–117)
BUN: 4 mg/dL — ABNORMAL LOW (ref 6–23)
CO2: 29 mEq/L (ref 19–32)
Calcium: 8.8 mg/dL (ref 8.4–10.5)
Chloride: 106 mEq/L (ref 96–112)
Creatinine, Ser: 0.78 mg/dL (ref 0.50–1.35)
GFR calc Af Amer: >90 mL/min (ref >90)
GFR calc non Af Amer: >90 mL/min (ref >90)
Glucose, Bld: 90 mg/dL (ref 70–99)
Potassium: 3.6 mEq/L (ref 3.5–5.1)
Sodium: 142 mEq/L (ref 135–145)
Total Bilirubin: 0.3 mg/dL (ref 0.3–1.2)
Total Protein: 7.5 g/dL (ref 6.0–8.3)

## 2013-07-15 LAB — CBC
HCT: 40.9 % (ref 39.0–52.0)
Hemoglobin: 13.8 g/dL (ref 13.0–17.0)
MCH: 29.4 pg (ref 26.0–34.0)
MCHC: 33.7 g/dL (ref 30.0–36.0)
MCV: 87.2 fL (ref 78.0–100.0)
Platelets: 207 10*3/uL (ref 150–400)
RBC: 4.69 MIL/uL (ref 4.22–5.81)
RDW: 14.5 % (ref 11.5–15.5)
WBC: 4.8 10*3/uL (ref 4.0–10.5)

## 2013-07-15 LAB — ACETAMINOPHEN LEVEL: Acetaminophen (Tylenol), Serum: <15 ug/mL (ref 10–30)

## 2013-07-15 LAB — RAPID URINE DRUG SCREEN, HOSP PERFORMED
Amphetamines: NOT DETECTED
Barbiturates: NOT DETECTED
Benzodiazepines: NOT DETECTED
Cocaine: NOT DETECTED
Opiates: NOT DETECTED
Tetrahydrocannabinol: NOT DETECTED

## 2013-07-15 LAB — ETHANOL: Alcohol, Ethyl (B): 332 mg/dL — ABNORMAL HIGH (ref 0–11)

## 2013-07-15 LAB — SALICYLATE LEVEL: Salicylate Lvl: <2 mg/dL — ABNORMAL LOW (ref 2.8–20.0)

## 2013-07-15 MED ORDER — LORAZEPAM 1 MG PO TABS
1.0000 mg | ORAL_TABLET | Freq: Once | ORAL | Status: AC
  Administered 2013-07-15: 1 mg via ORAL
  Filled 2013-07-15: qty 1

## 2013-07-15 MED ORDER — LORAZEPAM 1 MG PO TABS: 1.0000 mg | ORAL_TABLET | Freq: Three times a day (TID) | ORAL | Status: DC | PRN

## 2013-07-15 NOTE — ED Notes (Signed)
Per Ajsa; previous shift RN; pt denies request for etoh detox; states just feels weak

## 2013-07-15 NOTE — ED Notes (Addendum)
Per EMS pt requesting alcohol detox. Last drink today. Pt sts he is here because he is weak, and doesn't think that he has drinking problem. Pt sts "Everybody drinks when they watch TV, right?". Pt then reports feeling weak and painful urination.

## 2013-07-15 NOTE — ED Provider Notes (Signed)
CSN: [id number]     Arrival date & time 07/15/13  1818 History   First MD Initiated Contact with Patient 07/15/13 1951     Chief Complaint  Patient presents with  . Weakness   (Consider location/radiation/quality/duration/timing/severity/associated sxs/prior Treatment) HPI  51yM with hx of etoh abuse. Presenting because feels generally weak and shaky. Ongoing problem. Worse when he doesn't drink which drives him to continue drinking. "If someone would give me some damn pills I wouldn't have to." Denies drug use. No SI or HI. Pt reports has been in detox/rehab several times, but continues drinking after he gets out.  Pt does not attend AA meetings nor do it seems like he has made much effort to seek out outpt resources.   Past Medical History  Diagnosis Date  . Anxiety   . Depression   . COPD (chronic obstructive pulmonary disease)   . Seizures     last seizure 2 months ago  . Stroke     2009  . Alcohol abuse   . Cocaine abuse    Past Surgical History  Procedure Laterality Date  . No past surgeries     History reviewed. No pertinent family history. History  Substance Use Topics  . Smoking status: Current Every Day Smoker -- 1.00 packs/day for 18 years    Types: Cigarettes  . Smokeless tobacco: Never Used  . Alcohol Use: Yes     Comment: 4 qts/day     Review of Systems  All systems reviewed and negative, other than as noted in HPI.   Allergies  Peanut-containing drug products and Tramadol  Home Medications   Current Outpatient Rx  Name  Route  Sig  Dispense  Refill  . LORazepam (ATIVAN) 1 MG tablet   Oral   Take 1 tablet (1 mg total) by mouth every 8 (eight) hours as needed for anxiety.   15 tablet   0    BP 108/77  Pulse 73  Temp(Src) 98.4 F (36.9 C) (Oral)  Resp 20  SpO2 99% Physical Exam  Nursing note and vitals reviewed. Constitutional: He appears well-developed and well-nourished. No distress.  HENT:  Head: Normocephalic and atraumatic.  Eyes:  Conjunctivae are normal. Right eye exhibits no discharge. Left eye exhibits no discharge.  Neck: Neck supple.  Cardiovascular: Normal rate, regular rhythm and normal heart sounds.  Exam reveals no gallop and no friction rub.   No murmur heard. Pulmonary/Chest: Effort normal and breath sounds normal. No respiratory distress.  Abdominal: Soft. He exhibits no distension. There is no tenderness.  Musculoskeletal: He exhibits no edema and no tenderness.  Neurological: He is alert. He exhibits normal muscle tone. Coordination normal.  Mild shaking. Smells of ETOH, but speech clear. Crying when talking about deceased mother.   Skin: Skin is warm and dry. He is not diaphoretic.  Psychiatric: He has a normal mood and affect. His behavior is normal. Thought content normal.    ED Course  Procedures (including critical care time) Labs Review Labs Reviewed  COMPREHENSIVE METABOLIC PANEL - Abnormal; Notable for the following:    BUN 4 (*)    AST 130 (*)    ALT 66 (*)    All other components within normal limits  ETHANOL - Abnormal; Notable for the following:    Alcohol, Ethyl (B) 332 (*)    All other components within normal limits  SALICYLATE LEVEL - Abnormal; Notable for the following:    Salicylate Lvl <2.0 (*)    All other components  within normal limits  CBC  ACETAMINOPHEN LEVEL  URINE RAPID DRUG SCREEN (HOSP PERFORMED)  URINALYSIS, ROUTINE W REFLEX MICROSCOPIC   Imaging Review No results found.  EKG Interpretation   None       MDM   1. Alcohol dependence   2. Alcohol intoxication    51yM with alcohol dependence and current intoxication. Has been drinking since he was a teenager. Presenting symptoms likely related to etoh abuse. Alcohol elevated as expected. Mild elevation of lfts consistent with hx of etoh abuse. Pt won't provide urine specimen. Pt keeps changing mind about wanting detox. Will DC with outpt resources. Pt encouraged to follow-up or to return when he is ready to  make a commitment. Says his brother can pick him up.     Raeford Razor, MD 07/15/13 2036

## 2013-08-28 ENCOUNTER — Inpatient Hospital Stay (HOSPITAL_COMMUNITY)
Admission: AD | Admit: 2013-08-28 | Discharge: 2013-09-01 | DRG: 897 | Disposition: A | Discharge status: Home / Self Care | Payer: Federal, State, Local not specified - Other | Source: Intra-hospital | Attending: Psychiatry | Admitting: Psychiatry

## 2013-08-28 ENCOUNTER — Emergency Department (HOSPITAL_COMMUNITY)
Admission: EM | Admit: 2013-08-28 | Discharge: 2013-08-28 | Disposition: A | Discharge status: Other Acute Inpatient Hospital | Payer: Medicaid Other | Attending: Emergency Medicine | Admitting: Emergency Medicine

## 2013-08-28 ENCOUNTER — Encounter (HOSPITAL_COMMUNITY): Payer: Self-pay | Admitting: Emergency Medicine

## 2013-08-28 DIAGNOSIS — F17200 Nicotine dependence, unspecified, uncomplicated: Secondary | ICD-10-CM | POA: Diagnosis present

## 2013-08-28 DIAGNOSIS — R45851 Suicidal ideations: Secondary | ICD-10-CM | POA: Insufficient documentation

## 2013-08-28 DIAGNOSIS — J449 Chronic obstructive pulmonary disease, unspecified: Secondary | ICD-10-CM | POA: Diagnosis present

## 2013-08-28 DIAGNOSIS — R945 Abnormal results of liver function studies: Secondary | ICD-10-CM

## 2013-08-28 DIAGNOSIS — J4489 Other specified chronic obstructive pulmonary disease: Secondary | ICD-10-CM | POA: Insufficient documentation

## 2013-08-28 DIAGNOSIS — F1994 Other psychoactive substance use, unspecified with psychoactive substance-induced mood disorder: Secondary | ICD-10-CM | POA: Diagnosis present

## 2013-08-28 DIAGNOSIS — F411 Generalized anxiety disorder: Secondary | ICD-10-CM | POA: Diagnosis present

## 2013-08-28 DIAGNOSIS — R1084 Generalized abdominal pain: Secondary | ICD-10-CM | POA: Insufficient documentation

## 2013-08-28 DIAGNOSIS — F39 Unspecified mood [affective] disorder: Secondary | ICD-10-CM | POA: Insufficient documentation

## 2013-08-28 DIAGNOSIS — Z8673 Personal history of transient ischemic attack (TIA), and cerebral infarction without residual deficits: Secondary | ICD-10-CM

## 2013-08-28 DIAGNOSIS — G8929 Other chronic pain: Secondary | ICD-10-CM | POA: Insufficient documentation

## 2013-08-28 DIAGNOSIS — F1020 Alcohol dependence, uncomplicated: Principal | ICD-10-CM | POA: Diagnosis present

## 2013-08-28 DIAGNOSIS — F1410 Cocaine abuse, uncomplicated: Secondary | ICD-10-CM | POA: Diagnosis present

## 2013-08-28 DIAGNOSIS — K921 Melena: Secondary | ICD-10-CM | POA: Insufficient documentation

## 2013-08-28 DIAGNOSIS — G4700 Insomnia, unspecified: Secondary | ICD-10-CM | POA: Diagnosis present

## 2013-08-28 DIAGNOSIS — R110 Nausea: Secondary | ICD-10-CM | POA: Insufficient documentation

## 2013-08-28 DIAGNOSIS — Z8669 Personal history of other diseases of the nervous system and sense organs: Secondary | ICD-10-CM | POA: Insufficient documentation

## 2013-08-28 DIAGNOSIS — Z23 Encounter for immunization: Secondary | ICD-10-CM

## 2013-08-28 DIAGNOSIS — Z8659 Personal history of other mental and behavioral disorders: Secondary | ICD-10-CM | POA: Insufficient documentation

## 2013-08-28 DIAGNOSIS — F332 Major depressive disorder, recurrent severe without psychotic features: Secondary | ICD-10-CM | POA: Diagnosis present

## 2013-08-28 DIAGNOSIS — R7989 Other specified abnormal findings of blood chemistry: Secondary | ICD-10-CM | POA: Insufficient documentation

## 2013-08-28 LAB — RAPID URINE DRUG SCREEN, HOSP PERFORMED
Amphetamines: NOT DETECTED
Barbiturates: NOT DETECTED
Benzodiazepines: NOT DETECTED
COCAINE: POSITIVE — AB
Opiates: NOT DETECTED
TETRAHYDROCANNABINOL: NOT DETECTED

## 2013-08-28 LAB — CBC
HCT: 37.3 % — ABNORMAL LOW (ref 39.0–52.0)
HEMOGLOBIN: 12.6 g/dL — AB (ref 13.0–17.0)
MCH: 29.9 pg (ref 26.0–34.0)
MCHC: 33.8 g/dL (ref 30.0–36.0)
MCV: 88.4 fL (ref 78.0–100.0)
Platelets: 144 10*3/uL — ABNORMAL LOW (ref 150–400)
RBC: 4.22 MIL/uL (ref 4.22–5.81)
RDW: 14.7 % (ref 11.5–15.5)
WBC: 4.8 10*3/uL (ref 4.0–10.5)

## 2013-08-28 LAB — URINALYSIS, ROUTINE W REFLEX MICROSCOPIC
BILIRUBIN URINE: NEGATIVE
Glucose, UA: NEGATIVE mg/dL
HGB URINE DIPSTICK: NEGATIVE
Ketones, ur: NEGATIVE mg/dL
Leukocytes, UA: NEGATIVE
NITRITE: NEGATIVE
PROTEIN: NEGATIVE mg/dL
SPECIFIC GRAVITY, URINE: 1.002 — AB (ref 1.005–1.030)
Urobilinogen, UA: 0.2 mg/dL (ref 0.0–1.0)
pH: 6.5 (ref 5.0–8.0)

## 2013-08-28 LAB — COMPREHENSIVE METABOLIC PANEL
ALT: 128 U/L — AB (ref 0–53)
AST: 256 U/L — AB (ref 0–37)
Albumin: 4 g/dL (ref 3.5–5.2)
Alkaline Phosphatase: 155 U/L — ABNORMAL HIGH (ref 39–117)
BUN: 6 mg/dL (ref 6–23)
CO2: 28 meq/L (ref 19–32)
Calcium: 9.4 mg/dL (ref 8.4–10.5)
Chloride: 102 mEq/L (ref 96–112)
Creatinine, Ser: 0.71 mg/dL (ref 0.50–1.35)
GFR calc Af Amer: >90 mL/min (ref >90)
GLUCOSE: 77 mg/dL (ref 70–99)
POTASSIUM: 4.6 meq/L (ref 3.7–5.3)
SODIUM: 142 meq/L (ref 137–147)
TOTAL PROTEIN: 7.7 g/dL (ref 6.0–8.3)
Total Bilirubin: 0.4 mg/dL (ref 0.3–1.2)

## 2013-08-28 LAB — ACETAMINOPHEN LEVEL

## 2013-08-28 LAB — SALICYLATE LEVEL: Salicylate Lvl: <2 mg/dL — ABNORMAL LOW (ref 2.8–20.0)

## 2013-08-28 LAB — ETHANOL: Alcohol, Ethyl (B): 316 mg/dL — ABNORMAL HIGH (ref 0–11)

## 2013-08-28 MED ORDER — NICOTINE 21 MG/24HR TD PT24: 21.0000 mg | MEDICATED_PATCH | Freq: Every day | TRANSDERMAL | Status: DC

## 2013-08-28 MED ORDER — ACETAMINOPHEN 325 MG PO TABS
650.0000 mg | ORAL_TABLET | Freq: Four times a day (QID) | ORAL | Status: DC | PRN
  Administered 2013-08-28 – 2013-08-30 (×4): 650 mg via ORAL
  Filled 2013-08-28 (×4): qty 2

## 2013-08-28 MED ORDER — VITAMIN B-1 100 MG PO TABS
100.0000 mg | ORAL_TABLET | Freq: Every day | ORAL | Status: DC
  Administered 2013-08-29 – 2013-09-01 (×4): 100 mg via ORAL
  Filled 2013-08-28 (×7): qty 1

## 2013-08-28 MED ORDER — NICOTINE 21 MG/24HR TD PT24
21.0000 mg | MEDICATED_PATCH | Freq: Every day | TRANSDERMAL | Status: DC
  Administered 2013-08-29: 21 mg via TRANSDERMAL
  Filled 2013-08-28 (×4): qty 1

## 2013-08-28 MED ORDER — HYDROXYZINE HCL 25 MG PO TABS
25.0000 mg | ORAL_TABLET | Freq: Four times a day (QID) | ORAL | Status: AC | PRN
  Administered 2013-08-29 – 2013-08-30 (×2): 25 mg via ORAL
  Filled 2013-08-28 (×2): qty 1

## 2013-08-28 MED ORDER — ONDANSETRON 4 MG PO TBDP
4.0000 mg | ORAL_TABLET | Freq: Four times a day (QID) | ORAL | Status: AC | PRN
  Administered 2013-08-29: 4 mg via ORAL
  Filled 2013-08-28 (×2): qty 1

## 2013-08-28 MED ORDER — ALUM & MAG HYDROXIDE-SIMETH 200-200-20 MG/5ML PO SUSP
30.0000 mL | ORAL | Status: DC | PRN
  Administered 2013-08-30: 30 mL via ORAL

## 2013-08-28 MED ORDER — ALUM & MAG HYDROXIDE-SIMETH 200-200-20 MG/5ML PO SUSP: 30.0000 mL | ORAL | Status: DC | PRN

## 2013-08-28 MED ORDER — CHLORDIAZEPOXIDE HCL 25 MG PO CAPS: 25.0000 mg | ORAL_CAPSULE | Freq: Every day | ORAL | Status: DC

## 2013-08-28 MED ORDER — TRAZODONE HCL 50 MG PO TABS
50.0000 mg | ORAL_TABLET | Freq: Every evening | ORAL | Status: DC | PRN
  Administered 2013-08-29 – 2013-08-31 (×3): 50 mg via ORAL
  Filled 2013-08-28: qty 14
  Filled 2013-08-28 (×3): qty 1

## 2013-08-28 MED ORDER — THIAMINE HCL 100 MG/ML IJ SOLN: 100.0000 mg | Freq: Once | INTRAMUSCULAR | Status: DC

## 2013-08-28 MED ORDER — ZOLPIDEM TARTRATE 5 MG PO TABS
5.0000 mg | ORAL_TABLET | Freq: Every evening | ORAL | Status: DC | PRN
  Administered 2013-08-28: 5 mg via ORAL
  Filled 2013-08-28: qty 1

## 2013-08-28 MED ORDER — CHLORDIAZEPOXIDE HCL 25 MG PO CAPS
25.0000 mg | ORAL_CAPSULE | Freq: Three times a day (TID) | ORAL | Status: AC
  Administered 2013-08-30 – 2013-08-31 (×3): 25 mg via ORAL
  Filled 2013-08-28 (×3): qty 1

## 2013-08-28 MED ORDER — CHLORDIAZEPOXIDE HCL 25 MG PO CAPS
50.0000 mg | ORAL_CAPSULE | Freq: Once | ORAL | Status: AC
  Administered 2013-08-28: 50 mg via ORAL
  Filled 2013-08-28: qty 2

## 2013-08-28 MED ORDER — MAGNESIUM HYDROXIDE 400 MG/5ML PO SUSP: 30.0000 mL | Freq: Every day | ORAL | Status: DC | PRN

## 2013-08-28 MED ORDER — CHLORDIAZEPOXIDE HCL 25 MG PO CAPS
25.0000 mg | ORAL_CAPSULE | Freq: Four times a day (QID) | ORAL | Status: AC | PRN
  Administered 2013-08-30: 25 mg via ORAL
  Filled 2013-08-28: qty 1

## 2013-08-28 MED ORDER — LORAZEPAM 1 MG PO TABS
1.0000 mg | ORAL_TABLET | Freq: Three times a day (TID) | ORAL | Status: DC | PRN
  Administered 2013-08-28: 1 mg via ORAL
  Filled 2013-08-28: qty 1

## 2013-08-28 MED ORDER — IBUPROFEN 200 MG PO TABS: 600.0000 mg | ORAL_TABLET | Freq: Three times a day (TID) | ORAL | Status: DC | PRN

## 2013-08-28 MED ORDER — CHLORDIAZEPOXIDE HCL 25 MG PO CAPS
25.0000 mg | ORAL_CAPSULE | ORAL | Status: AC
  Administered 2013-08-31 – 2013-09-01 (×2): 25 mg via ORAL
  Filled 2013-08-28 (×2): qty 1

## 2013-08-28 MED ORDER — CHLORDIAZEPOXIDE HCL 25 MG PO CAPS
25.0000 mg | ORAL_CAPSULE | Freq: Four times a day (QID) | ORAL | Status: AC
  Administered 2013-08-29 – 2013-08-30 (×5): 25 mg via ORAL
  Filled 2013-08-28 (×5): qty 1

## 2013-08-28 MED ORDER — ACETAMINOPHEN 325 MG PO TABS: 650.0000 mg | ORAL_TABLET | ORAL | Status: DC | PRN

## 2013-08-28 MED ORDER — ONDANSETRON HCL 4 MG PO TABS: 4.0000 mg | ORAL_TABLET | Freq: Three times a day (TID) | ORAL | Status: DC | PRN

## 2013-08-28 MED ORDER — LOPERAMIDE HCL 2 MG PO CAPS: 2.0000 mg | ORAL_CAPSULE | ORAL | Status: AC | PRN

## 2013-08-28 MED ORDER — ADULT MULTIVITAMIN W/MINERALS CH
1.0000 | ORAL_TABLET | Freq: Every day | ORAL | Status: DC
  Administered 2013-08-29 – 2013-09-01 (×4): 1 via ORAL
  Filled 2013-08-28 (×7): qty 1

## 2013-08-28 NOTE — ED Notes (Signed)
Pt presents for medical clearance, reports he was at at drug house ,called [employer] telling them he was SI with plan to cut his wrist.  Pt reports family stressors and alcohol abuse.  Lives with sister.  Denies HI, reports he periodically sees things, drinks 1-4 quarts of beer every other day. Pt reports he started experiencing seizures 5 yrs ago and blackouts.  Pt calm & cooperative at present.

## 2013-08-28 NOTE — ED Notes (Signed)
Pt eval by TTS Ava.

## 2013-08-28 NOTE — Progress Notes (Addendum)
Per NP Manus Gunning the patient meets criteria for inpatient hospitalization.  Patient has been accepted to [facility] Bed 306-1.   Dr. Sabra Heck is the accepting doctor.    Writer informed the ER MD and the nurse.  Writer faxed support paperwork to [facility].

## 2013-08-28 NOTE — ED Notes (Signed)
Bed: [facility] Expected date:  Expected time:  Means of arrival:  Comments: EMS: 53 yo Medical Clearance/SI

## 2013-08-28 NOTE — ED Notes (Signed)
Pt was at a drug house and called [employer] and told them that he wanted to hurt self by cutting his wrist to be with his parents. ETOH states that he did drink 1 beer today. Tearful states that he has a hx of behavior issues.

## 2013-08-28 NOTE — ED Provider Notes (Signed)
Medical screening examination/treatment/procedure(s) were performed by non-physician practitioner and as supervising physician I was immediately available for consultation/collaboration.  Richarda Blade, MD 08/28/13 [phone]
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
## 2013-08-28 NOTE — ED Provider Notes (Signed)
CSN: [id number]     Arrival date & time 08/28/13  1641 History   First MD Initiated Contact with Patient 08/28/13 1708 This chart was scribed for non-physician practitioner Clayton Bibles, PA-C working with Richarda Blade, MD by Anastasia Pall, ED scribe. This patient was seen in room [facility] and the patient's care was started at 5:23 PM.     Chief Complaint  Patient presents with  . Medical Clearance    The history is provided by the patient. No language interpreter was used.   HPI Comments: Mario Andrews is a 53 y.o. male who came in voluntarily after calling [employer] who presents to the Emergency Department requesting medical clearance. He reports he was going to kill himself by cutting his wrist, from being around his family. He reports being depressed for a long time. He denies h/o trying to kill himself before. He denies having done anything to hurt himself. He denies HI. He reports his mother has died. He reports his father is close to dying. He reports being on disability, and being frustrated that he has helped other people, but states nobody wants to help him. He reports paying his sister's $400 rent, and states she is ungrateful. He reports h/o chronic abdominal and back pain. He reports occasional nausea. He reports h/o blood with every bowel movement. He denies vomiting, diarrhea, hematuria, urinary frequency, and any other associated symptoms.  PCP - No PCP Per Patient  Past Medical History  Diagnosis Date  . Anxiety   . Depression   . COPD (chronic obstructive pulmonary disease)   . Seizures     last seizure 2 months ago  . Stroke     2009  . Alcohol abuse   . Cocaine abuse    Past Surgical History  Procedure Laterality Date  . No past surgeries     No family history on file. History  Substance Use Topics  . Smoking status: Current Every Day Smoker -- 1.00 packs/day for 18 years    Types: Cigarettes  . Smokeless tobacco: Never Used  . Alcohol Use: Yes     Comment: 4  qts/day     Review of Systems  Gastrointestinal: Positive for nausea, abdominal pain and blood in stool. Negative for vomiting and diarrhea.  Genitourinary: Negative.  Negative for frequency and hematuria.  Musculoskeletal: Positive for back pain.  Psychiatric/Behavioral: Positive for suicidal ideas and dysphoric mood.       Negative for HI.    Allergies  Peanut-containing drug products and Tramadol  Home Medications  No current outpatient prescriptions on file.  BP 105/70  Pulse 82  Temp(Src) 98.1 F (36.7 C) (Oral)  Resp 20  SpO2 96%  Physical Exam  Nursing note and vitals reviewed. Constitutional: He appears well-developed and well-nourished. No distress.  HENT:  Head: Normocephalic and atraumatic.  Eyes: Conjunctivae are normal. No scleral icterus.  Neck: Neck supple.  Cardiovascular: Normal rate and regular rhythm.   Pulmonary/Chest: Effort normal and breath sounds normal. No respiratory distress. He has no wheezes. He has no rales.  Abdominal: Soft. He exhibits no distension and no mass. There is generalized tenderness. There is no rebound and no guarding.  Musculoskeletal: He exhibits no edema.  Neurological: He is alert. He exhibits normal muscle tone.  Skin: He is not diaphoretic.  Psychiatric: He exhibits a depressed mood. He expresses suicidal ideation. He expresses no homicidal ideation. He expresses suicidal plans. He expresses no homicidal plans.    ED Course  Procedures (  including critical care time)  DIAGNOSTIC STUDIES: Oxygen Saturation is 96% on room air, normal by my interpretation.    COORDINATION OF CARE: 5:29 PM-Discussed treatment plan with pt at bedside and pt agreed to plan.   Labs Review Labs Reviewed  CBC - Abnormal; Notable for the following:    Hemoglobin 12.6 (*)    HCT 37.3 (*)    Platelets 144 (*)    All other components within normal limits  COMPREHENSIVE METABOLIC PANEL - Abnormal; Notable for the following:    AST 256 (*)     ALT 128 (*)    Alkaline Phosphatase 155 (*)    All other components within normal limits  ETHANOL - Abnormal; Notable for the following:    Alcohol, Ethyl (B) 316 (*)    All other components within normal limits  SALICYLATE LEVEL - Abnormal; Notable for the following:    Salicylate Lvl <0.2 (*)    All other components within normal limits  URINE RAPID DRUG SCREEN (HOSP PERFORMED) - Abnormal; Notable for the following:    Cocaine POSITIVE (*)    All other components within normal limits  URINALYSIS, ROUTINE W REFLEX MICROSCOPIC - Abnormal; Notable for the following:    Specific Gravity, Urine 1.002 (*)    All other components within normal limits  ACETAMINOPHEN LEVEL   Imaging Review No results found.  EKG Interpretation   None      Filed Vitals:   08/28/13 1650  BP: 105/70  Pulse: 82  Temp: 98.1 F (36.7 C)  Resp: 20     MDM   1. Suicidal ideation   2. Elevated LFTs    Pt p/w suicidal ideation, plan to cut his wrists.  No HI.  He is currently voluntary.  He was not complaining of abdominal pain or vomiting and is afebrile.  His LFTs are elevated more than his baseline - discussed pt with Barton Dubois, PA-C, at change of shift who will discuss findings with patient and reexamine patient, encourage close follow up if exam is unchanged.  Also pending psych assessment, anticipate placement.     I personally performed the services described in this documentation, which was scribed in my presence. The recorded information has been reviewed and is accurate.     Clayton Bibles, PA-C 08/28/13 2017

## 2013-08-29 ENCOUNTER — Encounter (HOSPITAL_COMMUNITY): Payer: Self-pay | Admitting: *Deleted

## 2013-08-29 DIAGNOSIS — F1020 Alcohol dependence, uncomplicated: Principal | ICD-10-CM

## 2013-08-29 DIAGNOSIS — F411 Generalized anxiety disorder: Secondary | ICD-10-CM | POA: Diagnosis present

## 2013-08-29 DIAGNOSIS — F1410 Cocaine abuse, uncomplicated: Secondary | ICD-10-CM | POA: Diagnosis present

## 2013-08-29 MED ORDER — INFLUENZA VAC SPLIT QUAD 0.5 ML IM SUSP
0.5000 mL | INTRAMUSCULAR | Status: AC
  Administered 2013-08-30: 0.5 mL via INTRAMUSCULAR
  Filled 2013-08-29: qty 0.5

## 2013-08-29 MED ORDER — PNEUMOCOCCAL VAC POLYVALENT 25 MCG/0.5ML IJ INJ
0.5000 mL | INJECTION | INTRAMUSCULAR | Status: AC
  Administered 2013-08-30: 0.5 mL via INTRAMUSCULAR

## 2013-08-29 NOTE — BHH Group Notes (Signed)
[facility] LCSW Aftercare Discharge Planning Group Note   08/29/2013  8:45 AM  Participation Quality:  Did Not Attend  Regan Lemming, LCSW 08/29/2013 10:06 AM

## 2013-08-29 NOTE — Tx Team (Signed)
Interdisciplinary Treatment Plan Update (Adult)  Date: 08/29/2013  Time Reviewed:  9:45 AM  Progress in Treatment: Attending groups: Yes Participating in groups:  Yes Taking medication as prescribed:  Yes Tolerating medication:  Yes Family/Significant othe contact made: CSW assessing Patient understands diagnosis:  Yes Discussing patient identified problems/goals with staff:  Yes Medical problems stabilized or resolved:  Yes Denies suicidal/homicidal ideation: Yes Issues/concerns per patient self-inventory:  Yes Other:  New problem(s) identified: N/A  Discharge Plan or Barriers: CSW assessing for appropriate referrals.    Reason for Continuation of Hospitalization: Anxiety Depression Medication Stabilization  Comments: N/A  Estimated length of stay: 3-5 days  For review of initial/current patient goals, please see plan of care.  Attendees: Patient:     Family:     Physician:  Dr. Sabra Heck 08/29/2013 10:32 AM   Nursing:   Markham Jordan, RN 08/29/2013 10:32 AM   Clinical Social Worker:  Regan Lemming, LCSW 08/29/2013 10:32 AM   Other: Maureen Chatters, RN 08/29/2013 10:32 AM   Other:  Vidal Schwalbe, LCSW 08/29/2013 10:32 AM   Other:  Agustina Caroli, NP 08/29/2013 10:32 AM   Other:     Other:    Other:    Other:    Other:    Other:    Other:     Scribe for Treatment Team:   Ane Payment, 08/29/2013 , 10:32 AM

## 2013-08-29 NOTE — Progress Notes (Signed)
Adult Psychoeducational Group Note  Date:  08/29/2013 Time:  1:45 PM  Group Topic/Focus:  Early Warning Signs:   The focus of this group is to help patients identify signs or symptoms they exhibit before slipping into an unhealthy state or crisis.  Participation Level:  Did Not Attend  Additional Comments:  Pt was encouraged to attend group but pt stayed in bed.  Rogene Houston 08/29/2013, 1:45 PM
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
## 2013-08-29 NOTE — BHH Group Notes (Signed)
[facility] LCSW Group Therapy  08/29/2013 1:15 PM   Type of Therapy:  Group Therapy  Participation Level:  Did Not Attend  Regan Lemming, LCSW 08/29/2013 3:52 PM

## 2013-08-29 NOTE — BHH Suicide Risk Assessment (Signed)
[facility] INPATIENT:  Family/Significant Other Suicide Prevention Education  Suicide Prevention Education:  Patient Refusal for Family/Significant Other Suicide Prevention Education: The patient Mario Andrews has refused to provide written consent for family/significant other to be provided Family/Significant Other Suicide Prevention Education during admission and/or prior to discharge.  Physician notified.  Nail, Trevor Mace 08/29/2013, 1:24 PM

## 2013-08-29 NOTE — Progress Notes (Signed)
D:  Per pt self inventory pt reports sleeping well, appetite good, energy level normal, ability to pay attention good, denies depression/hopelessness, denies SI/HI/AVH, minimizes his s/s, pt has hand tremor, c/o some anxiety today, flat during interaction.      A:  Emotional support provided, Encouraged pt to continue with treatment plan and attend all group activities, q15 min checks maintained for safety.  R:  Pt is receptive, cooperative with staff and other patients, going to groups.
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
## 2013-08-29 NOTE — B[facility] Counselor (Signed)
Adult Comprehensive Assessment  Patient ID: Mario Andrews, male   DOB: Aug 04, 1961, 53 y.o.   MRN: [id number]  Information Source: Information source: Patient  Current Stressors:  Physical health (include injuries & life threatening diseases): Patient reports recent DX of liver problems from his doctor, motivating him to quit drinkning Substance abuse: Long history of substance abuse with alcohol, reports no other drugs.  patient reports he drinks because of his nerves.  Living/Environment/Situation:  Living Arrangements: Other relatives (lives with sister) Living conditions (as described by patient or guardian): patient reports things are good and stable, he just paid her rent and expects to return home  How long has patient lived in current situation?: for about a year What is atmosphere in current home: Supportive;Loving;Comfortable  Family History:  Marital status: Single Does patient have children?: No  Childhood History:  By whom was/is the patient raised?: Both parents Additional childhood history information: patient was very guarded and did not disclose. When discussing his nerves he shares he had things happen, however he forgets. Description of patient's relationship with caregiver when they were a child: Did not disclose. patient stated this was not relevant, but has a good relationship with his family. Patient's description of current relationship with people who raised him/her: father remains involved, did not discuss his mother. Does patient have siblings?: Yes Number of Siblings: 2 Description of patient's current relationship with siblings: Patient lives with sister and reports the rest of his family wants him to be home and not drinking any more. Reports they have always wanted him clean, but his nerves are such a problem Did patient suffer any verbal/emotional/physical/sexual abuse as a child?: No Did patient suffer from severe childhood neglect?: No Has patient ever  been sexually abused/assaulted/raped as an adolescent or adult?: No Was the patient ever a victim of a crime or a disaster?: No Witnessed domestic violence?: No Has patient been effected by domestic violence as an adult?: No  Education:  Highest grade of school patient has completed: 9th grade.  Reports he just stopped going Currently a student?: No Name of school: none Learning disability?: No  Employment/Work Situation:   Employment situation: Unemployed Patient's job has been impacted by current illness: No What is the longest time patient has a held a job?: Patient reports he worked a few years ago, but currently on disability Where was the patient employed at that time?: [employer] Has patient ever been in the [employer]?: No Has patient ever served in Recruitment consultant?: No  Financial Resources:   Financial resources: Teacher, early years/pre Does patient have a Programmer, applications or guardian?: No  Alcohol/Substance Abuse:   What has been your use of drugs/alcohol within the last 12 months?: Patient reports only drinking alochol. Reports he starts the day drinkning as a way of reducing withdrwal. Reports drinking quarts of beer. If attempted suicide, did drugs/alcohol play a role in this?: No Alcohol/Substance Abuse Treatment Hx: Past Tx, Inpatient;Past Tx, Outpatient;Past detox If yes, describe treatment: Previously been to [facility] and [facility]. Reports outpatient through [facility] and his probation officer and has been to [facility], but lacks consistency. Has alcohol/substance abuse ever caused legal problems?: Yes (currently on probation)  Social Support System:   Patient's Community Support System: Good Describe Community Support System: Patient reports good friends, family, neices, nephews that are all helpful and wanting him clean so he will not be sick with liver failure. patient reports he will be doing this alone. Type of faith/religion: none reported How does patient's faith  help to cope with current  illness?: NA  Leisure/Recreation:   Leisure and Hobbies: patient reports he spends time with family. reports being depressed recently and struggling with getting out of bed. Reports he has been sleeping more frequently  Strengths/Needs:   What things does the patient do well?: did not report In what areas does patient struggle / problems for patient: Staying clean and talking about his problems. Patient attempts to self medicate reporting his nerves are a major problem.  Discharge Plan:   Does patient have access to transportation?: Yes Will patient be returning to same living situation after discharge?: Yes Currently receiving community mental health services: Yes (From Whom) (will start with [facility] in January and has appointment.  Also has a Engineer, manufacturing systems.) Does patient have financial barriers related to discharge medications?: No  Summary/Recommendations:      Patient is a 53 year old male admitted for detox of alcohol due to medical problems with his liver. Patient shares he wants to live longer and be healthy and as his doctor told him he was having liver problems. Patient shares he lives with his sister, plans to return and has paid his rent. He receives disability money and is currently not employed.  Patient shares he is on probation but did not disclose or give permission to speak with anyone about his admission. Patient reports he will follow up with [facility] as supported by his probation officer and [facility]. Patient shares he will return home and complete 90 day outpatient program.  Patient very guarded regarding his assessment and does not disclose information related to his admission.   Nail, Trevor Mace. 08/29/2013

## 2013-08-30 MED ORDER — IBUPROFEN 600 MG PO TABS
600.0000 mg | ORAL_TABLET | Freq: Four times a day (QID) | ORAL | Status: DC | PRN
  Administered 2013-08-30: 600 mg via ORAL
  Filled 2013-08-30: qty 1

## 2013-08-30 NOTE — Progress Notes (Signed)
Patient ID: Mario Andrews, male   DOB: Jul 24, 1961, 53 y.o.   MRN: [id number] D)  Pt looks older than stated age.  Attended group, had snack and came to med window afterward.  Is pale and tremulous, compliant with meds but minimal conversation.  Disheveled, poor hygiene, requested hs librium and something for sleep, , went to his room. A)  Will continue to monitor for w/d sx. And safety, continue POC R)  Safety maintained.

## 2013-08-30 NOTE — Progress Notes (Signed)
[facility] MD Progress Note  08/30/2013 4:26 PM Mario Andrews  MRN:  [id number] Subjective:  Mario Andrews continues to be detox. He states he did sleep better last night.He is still endorsing a lot of anxiety. He is willing to try any medications that think might help. He is going back to his sister's house where his brother also lives. They both drink but no "like him." . He states he knows he needs to quit as now he is aware of the effect on his liver. He is also aware that if he does not comply, they might violate his probation Diagnosis:   DSM5: Schizophrenia Disorders:  none Obsessive-Compulsive Disorders:  none Trauma-Stressor Disorders:  none Substance/Addictive Disorders:  Alcohol Related Disorder - Severe (303.90), Cocaine related disorder Depressive Disorders:  Major Depressive Disorder - Moderate (296.22)  Axis I: Anxiety Disorder NOS  ADL's:  Intact  Sleep: Fair  Appetite:  Fair  Suicidal Ideation:  Plan:  denies Intent:  denies Means:  denies Homicidal Ideation:  Plan:  denies Intent:  denies Means:  denies AEB (as evidenced by):  Psychiatric Specialty Exam: Review of Systems  HENT: Negative.   Eyes: Negative.   Respiratory: Negative.   Cardiovascular: Negative.   Gastrointestinal: Negative.   Genitourinary: Negative.   Musculoskeletal: Negative.   Skin: Negative.   Neurological: Positive for weakness.  Endo/Heme/Allergies: Negative.   Psychiatric/Behavioral: Positive for depression and substance abuse. The patient is nervous/anxious.     Blood pressure 103/69, pulse 91, temperature 97.8 F (36.6 C), temperature source Oral, resp. rate 16, height 6' 1" (1.854 m), weight 66.225 kg (146 lb).Body mass index is 19.27 kg/(m^2).  General Appearance: Disheveled and in bed  Eye Contact::  Fair  Speech:  Clear and Coherent, Slow and not spontaneous  Volume:  Decreased  Mood:  Anxious and Depressed  Affect:  Restricted  Thought Process:  Coherent and Goal Directed   Orientation:  Full (Time, Place, and Person)  Thought Content:  symtpoms, worries, cocerns  Suicidal Thoughts:  No  Homicidal Thoughts:  No  Memory:  Immediate;   Fair Recent;   Fair Remote;   Fair  Judgement:  Fair  Insight:  Shallow  Psychomotor Activity:  Restlessness  Concentration:  Fair  Recall:  Fair  Akathisia:  NA  Handed:    AIMS (if indicated):     Assets:  Desire for Improvement  Sleep:  Number of Hours: 6.75   Current Medications: Current Facility-Administered Medications  Medication Dose Route Frequency Provider Last Rate Last Dose  . acetaminophen (TYLENOL) tablet 650 mg  650 mg Oral Q6H PRN Lurena Nida, NP   650 mg at 08/30/13 1101  . alum & mag hydroxide-simeth (MAALOX/MYLANTA) 200-200-20 MG/5ML suspension 30 mL  30 mL Oral Q4H PRN Lurena Nida, NP   30 mL at 08/30/13 1101  . chlordiazePOXIDE (LIBRIUM) capsule 25 mg  25 mg Oral Q6H PRN Lurena Nida, NP      . chlordiazePOXIDE (LIBRIUM) capsule 25 mg  25 mg Oral TID Lurena Nida, NP   25 mg at 08/30/13 1309   Followed by  . [START ON 08/31/2013] chlordiazePOXIDE (LIBRIUM) capsule 25 mg  25 mg Oral BH-qamhs Lurena Nida, NP       Followed by  . [START ON 09/02/2013] chlordiazePOXIDE (LIBRIUM) capsule 25 mg  25 mg Oral Daily Lurena Nida, NP      . hydrOXYzine (ATARAX/VISTARIL) tablet 25 mg  25 mg Oral Q6H PRN Lurena Nida, NP  25 mg at 08/30/13 8144  . loperamide (IMODIUM) capsule 2-4 mg  2-4 mg Oral PRN Lurena Nida, NP      . magnesium hydroxide (MILK OF MAGNESIA) suspension 30 mL  30 mL Oral Daily PRN Lurena Nida, NP      . multivitamin with minerals tablet 1 tablet  1 tablet Oral Daily Lurena Nida, NP   1 tablet at 08/30/13 0930  . ondansetron (ZOFRAN-ODT) disintegrating tablet 4 mg  4 mg Oral Q6H PRN Lurena Nida, NP   4 mg at 08/29/13 8185  . thiamine (B-1) injection 100 mg  100 mg Intramuscular Once Lurena Nida, NP      . thiamine (VITAMIN B-1) tablet 100 mg  100 mg Oral Daily Lurena Nida, NP    100 mg at 08/30/13 0931  . traZODone (DESYREL) tablet 50 mg  50 mg Oral QHS PRN Lurena Nida, NP   50 mg at 08/29/13 2200    Lab Results:  Results for orders placed during the hospital encounter of 08/28/13 (from the past 48 hour(s))  ACETAMINOPHEN LEVEL     Status: None   Collection Time    08/28/13  5:10 PM      Result Value Range   Acetaminophen (Tylenol), Serum <15.0  10 - 30 ug/mL   Comment:            THERAPEUTIC CONCENTRATIONS VARY     SIGNIFICANTLY. A RANGE OF 10-30     ug/mL MAY BE AN EFFECTIVE     CONCENTRATION FOR MANY PATIENTS.     HOWEVER, SOME ARE BEST TREATED     AT CONCENTRATIONS OUTSIDE THIS     RANGE.     ACETAMINOPHEN CONCENTRATIONS     >150 ug/mL AT 4 HOURS AFTER     INGESTION AND >50 ug/mL AT 12     HOURS AFTER INGESTION ARE     OFTEN ASSOCIATED WITH TOXIC     REACTIONS.  CBC     Status: Abnormal   Collection Time    08/28/13  5:10 PM      Result Value Range   WBC 4.8  4.0 - 10.5 K/uL   RBC 4.22  4.22 - 5.81 MIL/uL   Hemoglobin 12.6 (*) 13.0 - 17.0 g/dL   HCT 37.3 (*) 39.0 - 52.0 %   MCV 88.4  78.0 - 100.0 fL   MCH 29.9  26.0 - 34.0 pg   MCHC 33.8  30.0 - 36.0 g/dL   RDW 14.7  11.5 - 15.5 %   Platelets 144 (*) 150 - 400 K/uL  COMPREHENSIVE METABOLIC PANEL     Status: Abnormal   Collection Time    08/28/13  5:10 PM      Result Value Range   Sodium 142  137 - 147 mEq/L   Comment: Please note change in reference range.   Potassium 4.6  3.7 - 5.3 mEq/L   Comment: Please note change in reference range.   Chloride 102  96 - 112 mEq/L   CO2 28  19 - 32 mEq/L   Glucose, Bld 77  70 - 99 mg/dL   BUN 6  6 - 23 mg/dL   Creatinine, Ser 0.71  0.50 - 1.35 mg/dL   Calcium 9.4  8.4 - 10.5 mg/dL   Total Protein 7.7  6.0 - 8.3 g/dL   Albumin 4.0  3.5 - 5.2 g/dL   AST 256 (*) 0 - 37 U/L   ALT 128 (*)  0 - 53 U/L   Alkaline Phosphatase 155 (*) 39 - 117 U/L   Total Bilirubin 0.4  0.3 - 1.2 mg/dL   GFR calc non Af Amer >90  >90 mL/min   GFR calc Af Amer >90   >90 mL/min   Comment: (NOTE)     The eGFR has been calculated using the CKD EPI equation.     This calculation has not been validated in all clinical situations.     eGFR's persistently <90 mL/min signify possible Chronic Kidney     Disease.  ETHANOL     Status: Abnormal   Collection Time    08/28/13  5:10 PM      Result Value Range   Alcohol, Ethyl (B) 316 (*) 0 - 11 mg/dL   Comment:            LOWEST DETECTABLE LIMIT FOR     SERUM ALCOHOL IS 11 mg/dL     FOR MEDICAL PURPOSES ONLY  SALICYLATE LEVEL     Status: Abnormal   Collection Time    08/28/13  5:10 PM      Result Value Range   Salicylate Lvl <3.5 (*) 2.8 - 20.0 mg/dL  URINE RAPID DRUG SCREEN (HOSP PERFORMED)     Status: Abnormal   Collection Time    08/28/13  5:13 PM      Result Value Range   Opiates NONE DETECTED  NONE DETECTED   Cocaine POSITIVE (*) NONE DETECTED   Benzodiazepines NONE DETECTED  NONE DETECTED   Amphetamines NONE DETECTED  NONE DETECTED   Tetrahydrocannabinol NONE DETECTED  NONE DETECTED   Barbiturates NONE DETECTED  NONE DETECTED   Comment:            DRUG SCREEN FOR MEDICAL PURPOSES     ONLY.  IF CONFIRMATION IS NEEDED     FOR ANY PURPOSE, NOTIFY LAB     WITHIN 5 DAYS.                LOWEST DETECTABLE LIMITS     FOR URINE DRUG SCREEN     Drug Class       Cutoff (ng/mL)     Amphetamine      1000     Barbiturate      200     Benzodiazepine   573     Tricyclics       220     Opiates          300     Cocaine          300     THC              50  URINALYSIS, ROUTINE W REFLEX MICROSCOPIC     Status: Abnormal   Collection Time    08/28/13  5:13 PM      Result Value Range   Color, Urine YELLOW  YELLOW   APPearance CLEAR  CLEAR   Specific Gravity, Urine 1.002 (*) 1.005 - 1.030   pH 6.5  5.0 - 8.0   Glucose, UA NEGATIVE  NEGATIVE mg/dL   Hgb urine dipstick NEGATIVE  NEGATIVE   Bilirubin Urine NEGATIVE  NEGATIVE   Ketones, ur NEGATIVE  NEGATIVE mg/dL   Protein, ur NEGATIVE  NEGATIVE mg/dL    Urobilinogen, UA 0.2  0.0 - 1.0 mg/dL   Nitrite NEGATIVE  NEGATIVE   Leukocytes, UA NEGATIVE  NEGATIVE   Comment: MICROSCOPIC NOT DONE ON URINES WITH NEGATIVE PROTEIN, BLOOD, LEUKOCYTES, NITRITE,  OR GLUCOSE <1000 mg/dL.    Physical Findings: AIMS: Facial and Oral Movements Muscles of Facial Expression: None, normal Lips and Perioral Area: None, normal Jaw: None, normal Tongue: None, normal,Extremity Movements Upper (arms, wrists, hands, fingers): None, normal Lower (legs, knees, ankles, toes): None, normal, Trunk Movements Neck, shoulders, hips: None, normal, Overall Severity Incapacitation due to abnormal movements: None, normal Patient's awareness of abnormal movements (rate only patient's report): No Awareness, Dental Status Current problems with teeth and/or dentures?: No Does patient usually wear dentures?: No  CIWA:  CIWA-Ar Total: 0 COWS:     Treatment Plan Summary: Daily contact with patient to assess and evaluate symptoms and progress in treatment Medication management  Plan: Supportive approach/coping skills/relapse prevention           Continue detox           Reassess and address the co morbidities  Medical Decision Making Problem Points:  Review of psycho-social stressors (1) Data Points:  Review of medication regiment & side effects (2)  I certify that inpatient services furnished can reasonably be expected to improve the patient's condition.   , A 08/30/2013, 4:26 PM

## 2013-08-30 NOTE — BHH Group Notes (Cosign Needed)
[place] Group Notes: (Clinical Social Work)   08/30/2013      Type of Therapy:  Group Therapy   Participation Level:  Did Not Attend    Selmer Dominion, LCSW 08/30/2013, 12:50 PM

## 2013-08-30 NOTE — Progress Notes (Signed)
D: Patient denies SI/HI/AVH. Patient rates hopelessness as 8 and depression as 8.  Patient affect is flat. Mood is depressed.  Pt states that he slept poor and his appetite is improving.  Pt states that his energy level is low and his ability to pay attention is poor.  Patient visible on the milieu. No distress noted. A: Support and encouragement offered. Scheduled medications given to pt. Q 15 min checks continued for patient safety. R: Patient receptive. Patient remains safe on the unit.

## 2013-08-30 NOTE — [facility] Group Notes (Signed)
Adult Psychoeducational Group Note  Date:  08/30/2013 Time:  12:41 AM  Group Topic/Focus:  AA Meeting  Participation Level:  Minimal  Participation Quality:  Appropriate  Affect:  Appropriate  Cognitive:  Appropriate  Insight: Appropriate  Engagement in Group:  Limited  Modes of Intervention:  Discussion and Education  Additional Comments:  Mario Andrews attended group.  Mario Andrews 08/30/2013, 12:41 AM

## 2013-08-31 NOTE — Progress Notes (Signed)
08-31-13  NSG NOTE  7a-7p  D: Affect is blunted and depressed.  Mood is depressed.  Behavior is cooperative with encouragement, direction and support.  Interacts appropriately with peers and staff.  Did not attend group, required much encouragement to participate throughout day.   A:  Medications per MD order.  Support given throughout day.  1:1 time spent with pt.  R:  Following treatment plan.  Denies HI/SI, auditory or visual hallucinations.  Contracts for safety.

## 2013-08-31 NOTE — Progress Notes (Signed)
[facility] MD Progress Note  08/31/2013 2:59 PM Mario Andrews  MRN:  [id number] Subjective:  Mario Andrews endorses that he is feeling better than he thought he was going to be feeling by now. He states he needs to comply with treatment because otherwise he will have to pull time. He is planning to stay with his sister and brother. States that they do not drink like he does. Once detox, states he is going to be able to make it going to [facility] as planned Diagnosis:   DSM5: Schizophrenia Disorders:  none Obsessive-Compulsive Disorders:  none Trauma-Stressor Disorders:  none Substance/Addictive Disorders:  Alcohol Related Disorder - Severe (303.90) , Cocaine related disorder Depressive Disorders:  Major Depressive Disorder - Moderate (296.22)  Axis I: Anxiety Disorder NOS  ADL's:  Intact  Sleep: Fair  Appetite:  Fair  Suicidal Ideation:  Plan:  denies Intent:  denies Means:  denies Homicidal Ideation:  Plan:  denies Intent:  denies Means:  denies AEB (as evidenced by):  Psychiatric Specialty Exam: Review of Systems  Constitutional: Negative.   HENT: Negative.   Eyes: Negative.   Respiratory: Negative.   Cardiovascular: Negative.   Gastrointestinal: Negative.   Genitourinary: Negative.   Musculoskeletal: Negative.   Skin: Negative.   Neurological: Negative.   Endo/Heme/Allergies: Negative.   Psychiatric/Behavioral: Positive for substance abuse. The patient is nervous/anxious.     Blood pressure 99/61, pulse 71, temperature 97.3 F (36.3 C), temperature source Oral, resp. rate 18, height 6\' 1"  (1.854 m), weight 66.225 kg (146 lb).Body mass index is 19.27 kg/(m^2).  General Appearance: Fairly Groomed  Engineer, water::  Fair  Speech:  Clear and Coherent and not spontaneous  Volume:  Decreased  Mood:  worried  Affect:  Restricted  Thought Process:  Coherent and Goal Directed  Orientation:  Full (Time, Place, and Person)  Thought Content:  worries, concerns  Suicidal Thoughts:  No   Homicidal Thoughts:  No  Memory:  Immediate;   Fair Recent;   Fair Remote;   Fair  Judgement:  Fair  Insight:  Present and superficial  Psychomotor Activity:  Normal  Concentration:  Fair  Recall:  Fair  Akathisia:  No  Handed:    AIMS (if indicated):     Assets:  Desire for Improvement Housing  Sleep:  Number of Hours: 7.25   Current Medications: Current Facility-Administered Medications  Medication Dose Route Frequency Provider Last Rate Last Dose  . alum & mag hydroxide-simeth (MAALOX/MYLANTA) 200-200-20 MG/5ML suspension 30 mL  30 mL Oral Q4H PRN Lurena Nida, NP   30 mL at 08/30/13 1101  . chlordiazePOXIDE (LIBRIUM) capsule 25 mg  25 mg Oral Q6H PRN Lurena Nida, NP   25 mg at 08/30/13 2307  . chlordiazePOXIDE (LIBRIUM) capsule 25 mg  25 mg Oral BH-qamhs Lurena Nida, NP       Followed by  . [START ON 09/02/2013] chlordiazePOXIDE (LIBRIUM) capsule 25 mg  25 mg Oral Daily Lurena Nida, NP      . hydrOXYzine (ATARAX/VISTARIL) tablet 25 mg  25 mg Oral Q6H PRN Lurena Nida, NP   25 mg at 08/30/13 9371  . ibuprofen (ADVIL,MOTRIN) tablet 600 mg  600 mg Oral Q6H PRN Dara Hoyer, PA-C   600 mg at 08/30/13 2307  . loperamide (IMODIUM) capsule 2-4 mg  2-4 mg Oral PRN Lurena Nida, NP      . magnesium hydroxide (MILK OF MAGNESIA) suspension 30 mL  30 mL Oral Daily PRN Manus Gunning  Barrett Henle, NP      . multivitamin with minerals tablet 1 tablet  1 tablet Oral Daily Lurena Nida, NP   1 tablet at 08/31/13 0950  . ondansetron (ZOFRAN-ODT) disintegrating tablet 4 mg  4 mg Oral Q6H PRN Lurena Nida, NP   4 mg at 08/29/13 1779  . thiamine (B-1) injection 100 mg  100 mg Intramuscular Once Lurena Nida, NP      . thiamine (VITAMIN B-1) tablet 100 mg  100 mg Oral Daily Lurena Nida, NP   100 mg at 08/31/13 0950  . traZODone (DESYREL) tablet 50 mg  50 mg Oral QHS PRN Lurena Nida, NP   50 mg at 08/30/13 2307    Lab Results: No results found for this or any previous visit (from the past 48  hour(s)).  Physical Findings: AIMS: Facial and Oral Movements Muscles of Facial Expression: None, normal Lips and Perioral Area: None, normal Jaw: None, normal Tongue: None, normal,Extremity Movements Upper (arms, wrists, hands, fingers): None, normal Lower (legs, knees, ankles, toes): None, normal, Trunk Movements Neck, shoulders, hips: None, normal, Overall Severity Severity of abnormal movements (highest score from questions above): None, normal Incapacitation due to abnormal movements: None, normal Patient's awareness of abnormal movements (rate only patient's report): No Awareness, Dental Status Current problems with teeth and/or dentures?: No Does patient usually wear dentures?: No  CIWA:  CIWA-Ar Total: 4 COWS:     Treatment Plan Summary: Daily contact with patient to assess and evaluate symptoms and progress in treatment Medication management  Plan: Supportive approach/coping skills/relapse prevention           Continue detox/reassess and address the co morbidities  Medical Decision Making Problem Points:  Review of last therapy session (1), psychosocil stressors Data Points:  Review of new medications or change in dosage (2)  I certify that inpatient services furnished can reasonably be expected to improve the patient's condition.   Mckinnley Cottier A 08/31/2013, 2:59 PM

## 2013-08-31 NOTE — Progress Notes (Signed)
Adult Psychoeducational Group Note  Date:  08/31/2013 Time:  11:18 PM  Group Topic/Focus:  Personal Choices and Values:   The focus of this group is to help patients assess and explore the importance of values in their lives, how their values affect their decisions, how they express their values and what opposes their expression.  Participation Level:  Active  Participation Quality:  Appropriate  Affect:  Appropriate  Cognitive:  Alert  Insight: Appropriate  Engagement in Group:  Engaged  Modes of Intervention:  Activity and Discussion  Additional Comments:   Patient participated in group ball and questions activity. Patient engaged in group discussion on life choices and personal values.  Faythe Dingwall 08/31/2013, 11:18 PM

## 2013-08-31 NOTE — [facility] Group Notes (Signed)
[place] Group Notes:  (Nursing/MHT/Case Management/Adjunct)  Date:  08/31/2013  Time:  12:36 PM  Type of Therapy:  Group Therapy  Participation Level:  Did Not Attend  Participation Quality:  did not attend  Affect:  did not attend  Cognitive:  did not attend  Insight:  None  Engagement in Group:  did not attend  Modes of Intervention:  did not attend  Summary of Progress/Problems:Pt did not attend group, even after much staff encouragement.  Orlin Hilding 08/31/2013, 12:36 PM

## 2013-08-31 NOTE — B[facility] Group Notes (Signed)
[facility] Group Notes:  (Clinical Social Work)  08/31/2013  10:00-11:00AM  Summary of Progress/Problems:   The main focus of today's process group was to   identify the patient's current support system and decide on other supports that can be put in place.  The picture on workbook was used to discuss why additional supports are needed.  An emphasis was placed on using counselor, doctor, therapy groups, 12-step groups, and problem-specific support groups to expand supports.   There was also an extensive discussion about what constitutes a healthy support versus an unhealthy support.  The patient expressed full comprehension of the concepts presented, and agreed that there is a need to add more supports.  The patient stated the current supports in place are his whole family.  He kept moving in and out of the room, was not focused.   Type of Therapy:  Process Group with Motivational Interviewing  Participation Level:  Minimal  Participation Quality:  Inattentive  Affect:  Depressed  Cognitive:  Oriented  Insight:  Limited  Engagement in Therapy:  Limited  Modes of Intervention:   Education, Support and Processing, Activity  [employer], LCSW 08/31/2013, 12:15pm

## 2013-08-31 NOTE — Progress Notes (Signed)
Adult Psychoeducational Group Note  Date:  08/31/2013 Time:  12:08 AM  Group Topic/Focus:  Wrap-Up Group:   The focus of this group is to help patients review their daily goal of treatment and discuss progress on daily workbooks.  Participation Level:  Did Not Attend  Participation Quality:  did not attend  Affect:  did not attend  Cognitive:  did not attend  Insight: None  Engagement in Group:  did not attend  Modes of Intervention:  did not attend  Additional Comments:  Pt did not attend group due to being sleep.   Rockey Situ [facility] 08/31/2013, 12:08 AM

## 2013-08-31 NOTE — Progress Notes (Signed)
Patient ID: RENDER MARLEY, male   DOB: 1960/12/10, 53 y.o.   MRN: [id number] D)  Has spent most of the evening in bed, states hasn't felt well today, c/o generalized muscle soreness, but focused on left arm where he had gotten his flu shot.  Order was obtained for motrin d/t elevated liver enzymes, also given heat packs for arm discomfort.  Came out to the dayroom briefly for a snack, and to the med window , but has gone back to bed, hoping to feel better tomorrow.  Affect flat, sad, minimal conversation. A)  Will continue to monitor for w/d sx, and safety, continue POC R)  Safety maintained.

## 2013-08-31 NOTE — Progress Notes (Signed)
Adult Psychoeducational Group Note  Date:  08/31/2013 Time:  10:09 PM  Group Topic/Focus:  AA group  Participation Level:  Active  Participation Quality:  Appropriate  Affect:  Appropriate  Cognitive:  Alert  Insight: Appropriate  Engagement in Group:  Engaged  Modes of Intervention:  Discussion  Additional Comments:    Sharmon Revere 08/31/2013, 10:09 PM

## 2013-09-01 DIAGNOSIS — F1410 Cocaine abuse, uncomplicated: Secondary | ICD-10-CM

## 2013-09-01 MED ORDER — TRAZODONE HCL 50 MG PO TABS: 50.0000 mg | ORAL_TABLET | Freq: Every evening | ORAL | Status: DC | PRN

## 2013-09-01 NOTE — Progress Notes (Signed)
Patient ID: Mario Andrews, male   DOB: 1961/04/17, 53 y.o.   MRN: [id number] D)  Has been out and about on the hall this evening and in the dayroom, playing cards and interacting appropriately with peers and staff.  States has begun to feel a little better, had shaved and combed his hair.  Attended group this evening and participated, plans to go to [facility] upon discharge .  A)  Will continue to monitor for safety, w/d sx, continue POC R)  Safety maintained.

## 2013-09-01 NOTE — [facility] Group Notes (Signed)
[facility] LCSW Aftercare Discharge Planning Group Note   09/01/2013 8:45 AM  Participation Quality:  Alert, Appropriate and Oriented  Mood/Affect:  Calm   Depression Rating:  3  Anxiety Rating:  3  Thoughts of Suicide:  Pt denies SI/HI  Will you contract for safety?   Yes  Current AVH:  Pt denies  Plan for Discharge/Comments:  Pt attended discharge planning group and actively participated in group.  CSW provided pt with today's workbook.  Pt reports feeling ready to d/c today.  Pt reports returning home in [place] and will follow up with [facility] for medication management and therapy.  Pt also reports following up with [facility] and his probation officer.  Pt is requesting a letter to provide to his probation officer with the dates he was here and that he was on librium while hospitalized.  No further needs voiced by pt at this time.    Transportation Means: Pt reports access to transportation - provided pt with a bus pass  Supports: No supports mentioned at this time  Regan Lemming, [place] 09/01/2013 10:01 AM

## 2013-09-01 NOTE — Tx Team (Signed)
Interdisciplinary Treatment Plan Update (Adult)  Date: 09/01/2013  Time Reviewed:  9:45 AM  Progress in Treatment: Attending groups: Yes Participating in groups:  Yes Taking medication as prescribed:  Yes Tolerating medication:  Yes Family/Significant othe contact made: No, pt refused Patient understands diagnosis:  Yes Discussing patient identified problems/goals with staff:  Yes Medical problems stabilized or resolved:  Yes Denies suicidal/homicidal ideation: Yes Issues/concerns per patient self-inventory:  Yes Other:  New problem(s) identified: N/A  Discharge Plan or Barriers: Pt will follow up at [facility] for outpatient medication management and therapy.    Reason for Continuation of Hospitalization: Stable to d/c today  Comments: N/A  Estimated length of stay: D/C today  For review of initial/current patient goals, please see plan of care.  Attendees: Patient:  Mario Andrews  09/01/2013 10:43 AM   Family:     Physician:   09/01/2013 10:42 AM   Nursing:    09/01/2013 10:42 AM   Clinical Social Worker:  Regan Lemming, LCSW 09/01/2013 10:42 AM   Other: Agustina Caroli, NP 09/01/2013 10:42 AM   Other:  Maxie Better, LCSWA 09/01/2013 10:42 AM   Other:     Other:     Other:    Other:    Other:    Other:    Other:    Other:     Scribe for Treatment Team:   Ane Payment, 09/01/2013 , 10:42 AM

## 2013-09-01 NOTE — Progress Notes (Signed)
Adult Psychoeducational Group Note  Date:  09/01/2013 Time:  10:00AM Group Topic/Focus:  Wellness Toolbox:   The focus of this group is to discuss various aspects of wellness, balancing those aspects and exploring ways to increase the ability to experience wellness.  Patients will create a wellness toolbox for use upon discharge.  Participation Level:  Active  Participation Quality:  Appropriate and Attentive  Affect:  Appropriate  Cognitive:  Alert and Appropriate  Insight: Appropriate  Engagement in Group:  Engaged  Modes of Intervention:  Discussion  Additional Comments: Pt. Was attentive and appropriate during today's group discussion. Pt. Was able to complete self care assessment. Pt. Was able to identify area he need to improve on and areas they he do ok with containing to physical, psychological, emotional, spiritual, and relationship self care.      Mario Andrews 09/01/2013, 10:56 AM
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
## 2013-09-01 NOTE — Discharge Summary (Signed)
Physician Discharge Summary Note  Patient:  Mario Andrews is an 53 y.o., male MRN:  [id number] DOB:  06/04/1961 Patient phone:  [phone] (home)  Patient address:   [place] [address],   Date of Admission:  08/28/2013 Date of Discharge: 09/01/12  Reason for Admission: Alcohol detox  Discharge Diagnoses: Active Problems:   Alcohol dependence   Cocaine abuse   Anxiety state, unspecified  Review of Systems  Constitutional: Negative.   HENT: Negative.   Eyes: Negative.   Respiratory: Negative.   Cardiovascular: Negative.   Gastrointestinal: Negative.   Genitourinary: Negative.   Musculoskeletal: Negative.   Skin: Negative.   Neurological: Negative.   Endo/Heme/Allergies: Negative.   Psychiatric/Behavioral: Positive for depression (Stabilized with medication prior to discharge), memory loss and substance abuse (Alcoholism). Negative for suicidal ideas and hallucinations. The patient is nervous/anxious (Stabilized with medication prior to discharge) and has insomnia (Stabilized with medication prior to discharge).     DSM5: Schizophrenia Disorders:  NA Obsessive-Compulsive Disorders:  NA Trauma-Stressor Disorders:  NA Substance/Addictive Disorders:  Alcohol Related Disorder - Severe (303.90) Depressive Disorders:  Major Depressive Disorder - Moderate (296.22)  Axis Diagnosis:   AXIS I:  Alcohol Related Disorder - Severe (303.90), Major depressive disorder, recurrent AXIS II:  Deferred AXIS III:   Past Medical History  Diagnosis Date  . Anxiety   . Depression   . COPD (chronic obstructive pulmonary disease)   . Seizures     last seizure 2 months ago  . Stroke     2009  . Alcohol abuse   . Cocaine abuse    AXIS IV:  other psychosocial or environmental problems and Alcoholism AXIS V:  63  Level of Care:  OP  Hospital Course:  53 Y/O male with a long standing history of alcohol dependence. He got out of [facility] in July (after 90 days),  able to abstain for couple of weeks. States he experiences anxiety. He states no one wants to give him Xanax. He drinks 2 or 3 quarts once every other day (down reporting?). Experiences withdrawal nausea vomiting shakes, sweats when he tries to quit.. Using cocaine (secretive about it) states that he does not know why his sister called the police on him, "I only sit there and drink"  After admission assessment/evaluation coupled with UDS/toxicology reports that showed blood alcohol level of 316 and (+) cocaine, it was determined that Mario Andrews will need detoxification treatment protocols to re-stabilize his system of alcohol intoxication and to combat the withdrawal symptoms of alcohol. And his discharge plans included a referral to an outpatient treatment facility ([facility]) for a more intense substance abuse treatment and medication management. Mario Andrews was then started on Librium detoxification treatment protocols. He was also enrolled in group counseling sessions and activities where he counseled and learned coping skills that should help him after discharge to cope better, manage his substance abuse problems to maintain a much longer sobriety.   Besides the detoxification protocol, patient also was ordered and received Trazodone 50 mg Q bedtime for sleep . He was also was enrolled and attended AA/NA meetings being offered and held on this unit. He has no other previous and or identifiable medical conditions that required treatment and or monitoring. However, he was monitored closely for any potential problems that may arise as a result of and or during detoxification treatment. Patient tolerated his detoxification treatment without any significant adverse effects and or reactions reported.  Patient attended treatment team meeting  this am and met with the treatment team members. His reason for admission, present symptoms, substance abuse issues, response to treatment and discharge plans discussed. Patient endorsed  that he is doing well and stable for discharge to pursue the next phase of his substance abuse treatment. It was agreed upon that he will continue substance abuse treatment at the [facility] here in [place], [place] starting on 09/09/13 at 09:30 am.The address, date, time and contact information for the [facility] provided for patient in writing. In addition to residential substance abuse treatment,  Mario Andrews is encouraged to join/attend AA/NA meetings being offered and held within his community.   Upon discharge, patient adamantly denies suicidal, homicidal ideations, auditory, visual hallucinations, delusional thinking and or withdrawal symptoms. Patient left [facility] with all personal belongings in no apparent distress. He received 2 weeks worth supply samples of his [facility] discharge medications. Transportation per bus, and bus fare/voucher provided by [facility].   Consults:  psychiatry  Significant Diagnostic Studies:  labs: CBC with diff, CMP, UDS, Toxicology tests, U/A  Discharge Vitals:   Blood pressure 103/69, pulse 114, temperature 97.6 F (36.4 C), temperature source Oral, resp. rate 16, height _0  (1.854 m), weight 66.225 kg (146 lb). Body mass index is 19.27 kg/(m^2). Lab Results:   No results found for this or any previous visit (from the past 72 hour(s)).  Physical Findings: AIMS: Facial and Oral Movements Muscles of Facial Expression: None, normal Lips and Perioral Area: None, normal Jaw: None, normal Tongue: None, normal,Extremity Movements Upper (arms, wrists, hands, fingers): None, normal Lower (legs, knees, ankles, toes): None, normal, Trunk Movements Neck, shoulders, hips: None, normal, Overall Severity Severity of abnormal movements (highest score from questions above): None, normal Incapacitation due to abnormal movements: None, normal Patient's awareness of abnormal movements (rate only patient's report): No Awareness, Dental Status Current problems with teeth and/or  dentures?: No Does patient usually wear dentures?: No  CIWA:  CIWA-Ar Total: 2 COWS:     Psychiatric Specialty Exam: See Psychiatric Specialty Exam and Suicide Risk Assessment completed by Attending Physician prior to discharge.  Discharge destination:  Home  Is patient on multiple antipsychotic therapies at discharge:  No   Has Patient had three or more failed trials of antipsychotic monotherapy by history:  No  Recommended Plan for Multiple Antipsychotic Therapies: NA     Medication List       Indication   traZODone 50 MG tablet  Commonly known as:  DESYREL  Take 1 tablet (50 mg total) by mouth at bedtime as needed for sleep.   Indication:  Trouble Sleeping       Follow-up Information   Follow up with [facility] On 09/09/2013. (Appointment scheduled at 9:30 am on this date for therapy and medications.)    Contact information:   [address] [place], [place] [address] [phone]     Follow-up recommendations: Activity:  As tolerated Diet: As recommended by your primary care doctor. Keep all scheduled follow-up appointments as recommended.   Comments:  Take all your medications as prescribed by your mental healthcare provider. Report any adverse effects and or reactions from your medicines to your outpatient provider promptly. Patient is instructed and cautioned to not engage in alcohol and or illegal drug use while on prescription medicines. In the event of worsening symptoms, patient is instructed to call the crisis hotline, 911 and or go to the nearest ED for appropriate evaluation and treatment of symptoms. Follow-up with your primary care provider for  your other medical issues, concerns and or health care needs.  Total Discharge Time:  Greater than 30 minutes.  Signed: Encarnacion Slates, [place], FNP 09/02/2013, 4:21 PM  Patient was seen for psychiatric evaluation and suicide risk assessment.  case was discussed with the physician extender and made disposition  plan,  and reviewed the information documented and agree with the treatment plan.  Goran Olden,JANARDHAHA R. 09/03/2013 10:12 AM

## 2013-09-01 NOTE — Progress Notes (Signed)
Pt d/c from the hospital with a bus pass. All items returned. D/C instructions given, prescriptions given and samples given. Pt denies si and hi.

## 2013-09-01 NOTE — [facility] Suicide Risk Assessment (Signed)
Suicide Risk Assessment  Discharge Assessment     Demographic Factors:  Male, Adolescent or young adult, Caucasian and Low socioeconomic status  Mental Status Per Nursing Assessment::   On Admission:  Suicidal ideation indicated by patient;Suicide plan;Self-harm thoughts;Intention to act on suicide plan;Belief that plan would result in death  Current Mental Status by Physician: Mental Status Examination: Patient appeared as per his stated age, casually dressed, and fairly groomed, and maintaining good eye contact. Patient has good mood and his affect was constricted. He has normal rate, rhythm, and volume of speech. His thought process is linear and goal directed. Patient has denied suicidal, homicidal ideations, intentions or plans. Patient has no evidence of auditory or visual hallucinations, delusions, and paranoia. Patient has fair insight judgment and impulse control.  Loss Factors: Financial problems/change in socioeconomic status  Historical Factors: Family history of mental illness or substance abuse and Impulsivity  Risk Reduction Factors:   Sense of responsibility to family, Religious beliefs about death, Living with another person, especially a relative, Positive social support, Positive therapeutic relationship and Positive coping skills or problem solving skills  Continued Clinical Symptoms:  Depression:   Recent sense of peace/wellbeing Alcohol/Substance Abuse/Dependencies  Cognitive Features That Contribute To Risk:  Closed-mindedness    Suicide Risk:  Minimal: No identifiable suicidal ideation.  Patients presenting with no risk factors but with morbid ruminations; may be classified as minimal risk based on the severity of the depressive symptoms  Discharge Diagnoses:   AXIS I:  Major Depression, Recurrent severe, Substance Induced Mood Disorder and Alcohol dependence and cocaine abuse AXIS II:  Deferred AXIS III:   Past Medical History  Diagnosis Date  . Anxiety    . Depression   . COPD (chronic obstructive pulmonary disease)   . Seizures     last seizure 2 months ago  . Stroke     2009  . Alcohol abuse   . Cocaine abuse    AXIS IV:  other psychosocial or environmental problems, problems related to social environment and problems with primary support group AXIS V:  61-70 mild symptoms  Plan Of Care/Follow-up recommendations:  Activity:  As tolerated Diet:  Regular  Is patient on multiple antipsychotic therapies at discharge:  No   Has Patient had three or more failed trials of antipsychotic monotherapy by history:  No  Recommended Plan for Multiple Antipsychotic Therapies: NA  Ashly Yepez,JANARDHAHA R. 09/01/2013, 2:05 PM

## 2013-09-01 NOTE — Progress Notes (Signed)
[facility] Adult Case Management Discharge Plan :  Will you be returning to the same living situation after discharge: Yes,  returning home At discharge, do you have transportation home?:Yes,  provided pt with a bus pass Do you have the ability to pay for your medications:Yes,  access to meds  Release of information consent forms completed and in the chart;  Patient's signature needed at discharge.  Patient to Follow up at: Follow-up Information   Follow up with [facility] On 09/09/2013. (Appointment scheduled at 9:30 am on this date for therapy and medications.)    Contact information:   [address] [place], [place] [address] [phone]      Patient denies SI/HI:   Yes,  denies SI/HI    Safety Planning and Suicide Prevention discussed:  Yes,  discussed with pt.  Pt refused contact with friend/family.  See suicide prevention education note.   Ane Payment 09/01/2013, 10:41 AM

## 2013-09-02 ENCOUNTER — Emergency Department (HOSPITAL_COMMUNITY)
Admission: EM | Admit: 2013-09-02 | Discharge: 2013-09-02 | Disposition: A | Discharge status: Home / Self Care | Payer: Medicaid Other | Attending: Emergency Medicine | Admitting: Emergency Medicine

## 2013-09-02 ENCOUNTER — Encounter (HOSPITAL_COMMUNITY): Payer: Self-pay | Admitting: Emergency Medicine

## 2013-09-02 DIAGNOSIS — F1310 Sedative, hypnotic or anxiolytic abuse, uncomplicated: Secondary | ICD-10-CM | POA: Insufficient documentation

## 2013-09-02 DIAGNOSIS — F411 Generalized anxiety disorder: Secondary | ICD-10-CM | POA: Insufficient documentation

## 2013-09-02 DIAGNOSIS — J449 Chronic obstructive pulmonary disease, unspecified: Secondary | ICD-10-CM | POA: Insufficient documentation

## 2013-09-02 DIAGNOSIS — F1010 Alcohol abuse, uncomplicated: Secondary | ICD-10-CM

## 2013-09-02 DIAGNOSIS — F329 Major depressive disorder, single episode, unspecified: Secondary | ICD-10-CM | POA: Insufficient documentation

## 2013-09-02 DIAGNOSIS — Z8669 Personal history of other diseases of the nervous system and sense organs: Secondary | ICD-10-CM | POA: Insufficient documentation

## 2013-09-02 DIAGNOSIS — F3289 Other specified depressive episodes: Secondary | ICD-10-CM | POA: Insufficient documentation

## 2013-09-02 DIAGNOSIS — J4489 Other specified chronic obstructive pulmonary disease: Secondary | ICD-10-CM | POA: Insufficient documentation

## 2013-09-02 DIAGNOSIS — Z8673 Personal history of transient ischemic attack (TIA), and cerebral infarction without residual deficits: Secondary | ICD-10-CM | POA: Insufficient documentation

## 2013-09-02 DIAGNOSIS — F1410 Cocaine abuse, uncomplicated: Secondary | ICD-10-CM | POA: Insufficient documentation

## 2013-09-02 DIAGNOSIS — F17200 Nicotine dependence, unspecified, uncomplicated: Secondary | ICD-10-CM | POA: Insufficient documentation

## 2013-09-02 LAB — COMPREHENSIVE METABOLIC PANEL
ALBUMIN: 3.8 g/dL (ref 3.5–5.2)
ALT: 70 U/L — AB (ref 0–53)
AST: 90 U/L — AB (ref 0–37)
Alkaline Phosphatase: 105 U/L (ref 39–117)
BUN: 9 mg/dL (ref 6–23)
CALCIUM: 9.1 mg/dL (ref 8.4–10.5)
CO2: 26 mEq/L (ref 19–32)
Chloride: 99 mEq/L (ref 96–112)
Creatinine, Ser: 0.75 mg/dL (ref 0.50–1.35)
GFR calc Af Amer: >90 mL/min (ref >90)
GFR calc non Af Amer: >90 mL/min (ref >90)
Glucose, Bld: 98 mg/dL (ref 70–99)
Potassium: 4.1 mEq/L (ref 3.7–5.3)
SODIUM: 138 meq/L (ref 137–147)
TOTAL PROTEIN: 7.5 g/dL (ref 6.0–8.3)
Total Bilirubin: 0.4 mg/dL (ref 0.3–1.2)

## 2013-09-02 LAB — RAPID URINE DRUG SCREEN, HOSP PERFORMED
AMPHETAMINES: NOT DETECTED
Barbiturates: NOT DETECTED
Benzodiazepines: POSITIVE — AB
COCAINE: POSITIVE — AB
OPIATES: NOT DETECTED
TETRAHYDROCANNABINOL: NOT DETECTED

## 2013-09-02 LAB — CBC
HCT: 35.2 % — ABNORMAL LOW (ref 39.0–52.0)
Hemoglobin: 11.9 g/dL — ABNORMAL LOW (ref 13.0–17.0)
MCH: 30.6 pg (ref 26.0–34.0)
MCHC: 33.8 g/dL (ref 30.0–36.0)
MCV: 90.5 fL (ref 78.0–100.0)
Platelets: 155 10*3/uL (ref 150–400)
RBC: 3.89 MIL/uL — AB (ref 4.22–5.81)
RDW: 14.9 % (ref 11.5–15.5)
WBC: 6.9 10*3/uL (ref 4.0–10.5)

## 2013-09-02 LAB — ETHANOL: Alcohol, Ethyl (B): 19 mg/dL — ABNORMAL HIGH (ref 0–11)

## 2013-09-02 LAB — SALICYLATE LEVEL

## 2013-09-02 LAB — ACETAMINOPHEN LEVEL: Acetaminophen (Tylenol), Serum: <15 ug/mL (ref 10–30)

## 2013-09-02 NOTE — Progress Notes (Signed)
EDCM discussed the importance of finding a pcp with the patient.  Patient verbalized understanding.

## 2013-09-02 NOTE — ED Provider Notes (Signed)
CSN: [id number]     Arrival date & time 09/02/13  1723 History   First MD Initiated Contact with Patient 09/02/13 1807     Chief Complaint  Patient presents with  . ETOH detox    (Consider location/radiation/quality/duration/timing/severity/associated sxs/prior Treatment) HPI Comments: Pt is a 53 y.o. male with Pmhx as above who presents with request for ETOH detox.  Pt d/c yesterday from [facility] for same, went home starting drinking again and used cocaine. He states he believes it will be successful this time as his sister is removing the ETOH from their house. He denies SI/HI. Has hx of DT and w/draw sz when has stopping drinking in past.  Last drink yesterday evening around 5p.  Patient is a 53 y.o. male presenting with alcohol problem. The history is provided by the patient. No language interpreter was used.  Alcohol Problem This is a chronic problem. The current episode started 12 to 24 hours ago. The problem occurs daily. The problem has not changed since onset.Pertinent negatives include no chest pain, no abdominal pain, no headaches and no shortness of breath. Nothing aggravates the symptoms. Nothing relieves the symptoms. He has tried nothing for the symptoms. The treatment provided no relief.    Past Medical History  Diagnosis Date  . Anxiety   . Depression   . COPD (chronic obstructive pulmonary disease)   . Seizures     last seizure 2 months ago  . Stroke     2009  . Alcohol abuse   . Cocaine abuse    Past Surgical History  Procedure Laterality Date  . No past surgeries     No family history on file. History  Substance Use Topics  . Smoking status: Current Every Day Smoker -- 1.00 packs/day for 18 years    Types: Cigarettes  . Smokeless tobacco: Never Used  . Alcohol Use: Yes     Comment: 4 qts/day     Review of Systems  Constitutional: Negative for fever, activity change, appetite change and fatigue.  HENT: Negative for congestion, facial swelling, rhinorrhea and  trouble swallowing.   Eyes: Negative for photophobia and pain.  Respiratory: Negative for cough, chest tightness and shortness of breath.   Cardiovascular: Negative for chest pain and leg swelling.  Gastrointestinal: Negative for nausea, vomiting, abdominal pain, diarrhea and constipation.  Endocrine: Negative for polydipsia and polyuria.  Genitourinary: Negative for dysuria, urgency, decreased urine volume and difficulty urinating.  Musculoskeletal: Negative for back pain and gait problem.  Skin: Negative for color change, rash and wound.  Allergic/Immunologic: Negative for immunocompromised state.  Neurological: Negative for dizziness, facial asymmetry, speech difficulty, weakness, numbness and headaches.  Psychiatric/Behavioral: Negative for confusion, decreased concentration and agitation.    Allergies  Peanut-containing drug products and Tramadol  Home Medications   Current Outpatient Rx  Name  Route  Sig  Dispense  Refill  . traZODone (DESYREL) 50 MG tablet   Oral   Take 1 tablet (50 mg total) by mouth at bedtime as needed for sleep.   30 tablet   0    BP 106/68  Pulse 85  Temp(Src) 97.8 F (36.6 C) (Oral)  Resp 20  SpO2 98% Physical Exam  Constitutional: He is oriented to person, place, and time. He appears well-developed and well-nourished. No distress.  HENT:  Head: Normocephalic and atraumatic.  Mouth/Throat: No oropharyngeal exudate.  Eyes: Pupils are equal, round, and reactive to light.  Neck: Normal range of motion. Neck supple.  Cardiovascular: Normal rate, regular  rhythm and normal heart sounds.  Exam reveals no gallop and no friction rub.   No murmur heard. Pulmonary/Chest: Effort normal and breath sounds normal. No respiratory distress. He has no wheezes. He has no rales.  Abdominal: Soft. Bowel sounds are normal. He exhibits no distension and no mass. There is no tenderness. There is no rebound and no guarding.  Musculoskeletal: Normal range of motion.  He exhibits no edema and no tenderness.  Neurological: He is alert and oriented to person, place, and time.  Skin: Skin is warm and dry.  Psychiatric: He has a normal mood and affect.    ED Course  Procedures (including critical care time) Labs Review Labs Reviewed  CBC - Abnormal; Notable for the following:    RBC 3.89 (*)    Hemoglobin 11.9 (*)    HCT 35.2 (*)    All other components within normal limits  COMPREHENSIVE METABOLIC PANEL - Abnormal; Notable for the following:    AST 90 (*)    ALT 70 (*)    All other components within normal limits  ETHANOL - Abnormal; Notable for the following:    Alcohol, Ethyl (B) 19 (*)    All other components within normal limits  SALICYLATE LEVEL - Abnormal; Notable for the following:    Salicylate Lvl <1.6 (*)    All other components within normal limits  URINE RAPID DRUG SCREEN (HOSP PERFORMED) - Abnormal; Notable for the following:    Cocaine POSITIVE (*)    Benzodiazepines POSITIVE (*)    All other components within normal limits  ACETAMINOPHEN LEVEL   Imaging Review No results found.  EKG Interpretation   None       MDM   1. Alcohol abuse    Pt is a 53 y.o. male with Pmhx as above who presents with request for ETOH detox.  Pt d/c yesterday from [facility] for same, went home starting drinking again and used cocaine. He states he believes it will be successful this time as his sister is removing the ETOH from their house. He denies SI/HI. Has hx of DT and w/draw sz when has stopping drinking in past.  Last drink yesterday evening around 5p. Will have TSS eval, though I am not convinced repeat admission likely to be successful given he is less than 48 hrs out.    TTS has spoken w/ pt who does not qualify for inpt ETOH detox as he has only been drinking for 1 day.  I also believe he is safe to f/u as outpt as planned at time of d/c yesterday.  He has reported his sister will removed ETOH from house.  Return precautions given for new or  worsening symptoms including SI/HI.      Neta Ehlers, MD 09/03/13 1133

## 2013-09-02 NOTE — BH Assessment (Signed)
Tele Assessment Note   Mario Andrews is an 53 y.o. male who presents seeking detox.  He reports he was discharged from [facility] yesterday after approximately a week of detox and when he got home his family was drinking and he slipped up.  He was concerned that he had sabotaged himself as he has plans to go to [facility] on the 13th and is hopeful about his recovery plan there.  He denies HI, SI, or AVH, although endorses some depression and reports [facility] and [facility] want [facility] to [facility] back.  He says he had money today and went to get his medication instead of getting alcohol, so he was proud of himself.  He also told his sister that if he comes home again and there is beer in the house, then he will leave.  He is optimistic about his ability to stay sober until the 13th.  This Probation officer explained that he had not been drinking long enough to require a medical detox and offered to link him to other resources, but he was satisfied with his current plan and agreeable to discharging back home to follow up with [facility] and [facility].  Spoke with EDPA, Tawni Levy, who is in agreement with the disposition.      Axis I: Substance Induced Mood Disorder and Alcohol Dependence Axis II: Deferred Axis III:  Past Medical History  Diagnosis Date  . Anxiety   . Depression   . COPD (chronic obstructive pulmonary disease)   . Seizures     last seizure 2 months ago  . Stroke     2009  . Alcohol abuse   . Cocaine abuse    Axis IV: problems related to legal system/crime, problems with access to health care services and problems with primary support group Axis V: 51-60 moderate symptoms  Past Medical History:  Past Medical History  Diagnosis Date  . Anxiety   . Depression   . COPD (chronic obstructive pulmonary disease)   . Seizures     last seizure 2 months ago  . Stroke     2009  . Alcohol abuse   . Cocaine abuse     Past Surgical History  Procedure Laterality Date  . No past surgeries       Family History: No family history on file.  Social History:  reports that he has been smoking Cigarettes.  He has a 18 pack-year smoking history. He has never used smokeless tobacco. He reports that he drinks alcohol. He reports that he uses illicit drugs ("Crack" cocaine and Cocaine).  Additional Social History:  Alcohol / Drug Use History of alcohol / drug use?: Yes Longest period of sobriety (when/how long): 1 week Substance #1 Name of Substance 1: Alcohol 1 - Age of First Use: 21 1 - Amount (size/oz): 3 quarts of icehouse 1 - Frequency: 1 day 1 - Duration: 1 day 1 - Last Use / Amount: 09/01/13 Substance #2 Name of Substance 2: Crack Cocaine 2 - Age of First Use: 31 2 - Amount (size/oz): $75 2 - Frequency: 09/01/12 2 - Duration: 1 day 2 - Last Use / Amount: 09/01/12  CIWA: CIWA-Ar BP: 107/74 mmHg Pulse Rate: 87 COWS:    Allergies:  Allergies  Allergen Reactions  . Peanut-Containing Drug Products Hives  . Tramadol Nausea Only    "upset stomach"    Home Medications:  (Not in a hospital admission)  OB/GYN Status:  No LMP for male patient.  General  Assessment Data Location of Assessment: [facility] Is this a Tele or Face-to-Face Assessment?: Tele Assessment Is this an Initial Assessment or a Re-assessment for this encounter?: Initial Assessment Living Arrangements: Other relatives (brothers and sisters) Can pt return to current living arrangement?: Yes Admission Status: Voluntary Is patient capable of signing voluntary admission?: Yes Transfer from: Acute Hospital Referral Source: Self/Family/Friend     [place] Living Arrangements: Other relatives (brothers and sisters)  Education Status Is patient currently in school?: No Highest grade of school patient has completed: 9  Risk to self Suicidal Ideation: No Suicidal Intent: No Is patient at risk for suicide?: No Suicidal Plan?: No Access to Means: No What has been your use of drugs/alcohol  within the last 12 months?: relapsed yesterday Previous Attempts/Gestures: No Intentional Self Injurious Behavior: None Family Suicide History: No Recent stressful life event(s): Other (Comment) (relapse) Persecutory voices/beliefs?: No Depression: Yes Depression Symptoms: Insomnia;Tearfulness;Isolating;Fatigue;Guilt;Loss of interest in usual pleasures;Feeling worthless/self pity;Feeling angry/irritable Substance abuse history and/or treatment for substance abuse?: Yes Suicide prevention information given to non-admitted patients: Not applicable  Risk to Others Homicidal Ideation: No Thoughts of Harm to Others: No Current Homicidal Intent: No Current Homicidal Plan: No Access to Homicidal Means: No History of harm to others?: No Assessment of Violence: None Noted Does patient have access to weapons?: No Criminal Charges Pending?: No Does patient have a court date: No  Psychosis Hallucinations: None noted Delusions: None noted  Mental Status Report Appear/Hygiene: Other (Comment) (unremarkable) Eye Contact: Good Motor Activity: Freedom of movement Speech: Logical/coherent Level of Consciousness: Alert Mood: Depressed Affect: Appropriate to circumstance Anxiety Level: Panic Attacks Panic attack frequency: every other day Most recent panic attack: 2 weeks ago Thought Processes: Coherent;Relevant Judgement: Unimpaired Orientation: Person;Place;Time;Situation Obsessive Compulsive Thoughts/Behaviors: Minimal  Cognitive Functioning Concentration: Decreased Memory: Recent Intact;Remote Intact IQ: Average Insight: Fair Impulse Control: Fair Appetite: Good Weight Loss: 0 Weight Gain: 0 Sleep: Decreased Total Hours of Sleep: 5 Vegetative Symptoms: None  ADLScreening [facility] Assessment Services) Patient's cognitive ability adequate to safely complete daily activities?: Yes Patient able to express need for assistance with ADLs?: Yes Independently performs ADLs?: Yes  (appropriate for developmental age)  Prior Inpatient Therapy Prior Inpatient Therapy: Yes Prior Therapy Dates: 2014, 2010 Prior Therapy [facility]ilty[facility]Provider([facility]  Reason for Treatment: Detox and Depression   Prior Outpatient Therapy Prior Outpatient Therapy: Yes Prior Therapy Dates: Ongoing  Prior Therapy Facilty/Provider(s): [facility]  Reason for Treatment: Medication Management and Counseling   ADL Screening (condition at time of admission) Patient's cognitive ability adequate to safely complete daily activities?: Yes Patient able to express need for assistance with ADLs?: Yes Independently performs ADLs?: Yes (appropriate for developmental age)       Abuse/Neglect Assessment (Assessment to be complete while patient is alone) Physical Abuse: Denies Verbal Abuse: Denies Sexual Abuse: Denies Exploitation of patient/patient's resources: Denies Self-Neglect: Denies Values / Beliefs Cultural Requests During Hospitalization: None Spiritual Requests During Hospitalization: None   Advance Directives (For Healthcare) Advance Directive: Patient does not have advance directive;Patient would not like information Pre-existing out of facility DNR order (yellow form or pink MOST form): No Nutrition Screen- [facility] Patient's home diet: Regular  Additional Information 1:1 In Past 12 Months?: No CIRT Risk: No Elopement Risk: No Does patient have medical clearance?: Yes     Disposition:  Disposition Initial Assessment Completed for this Encounter: Yes Disposition of Patient: Outpatient treatment;Other dispositions Type of inpatient treatment program: Adult Type of outpatient treatment:  Adult Other disposition(s): Information only  Darlys Gales 09/02/2013 8:26 PM

## 2013-09-02 NOTE — ED Notes (Signed)
Pt here for detox from alcohol, drinks about 3-4 40 ounces a day. Last drink was yesterday evening. Denies SI/HI.

## 2013-09-02 NOTE — Discharge Instructions (Signed)
Alcohol Use Disorder Alcohol use disorder is a mental disorder. It is not a one-time incident of heavy drinking. Alcohol use disorder is the excessive and uncontrollable use of alcohol over time that leads to problems with functioning in one or more areas of daily living. People with this disorder risk harming themselves and others when they drink to excess. Alcohol use disorder also can cause other mental disorders, such as mood and anxiety disorders, and serious physical problems. People with alcohol use disorder often misuse other drugs.  Alcohol use disorder is common and widespread. Some people with this disorder drink alcohol to cope with or escape from negative life events. Others drink to relieve chronic pain or symptoms of mental illness. People with a family history of alcohol use disorder are at higher risk of losing control and using alcohol to excess.  SYMPTOMS  Signs and symptoms of alcohol use disorder may include the following:   Consumption ofalcohol inlarger amounts or over a longer period of time than intended.  Multiple unsuccessful attempts to cutdown or control alcohol use.   A great deal of time spent obtaining alcohol, using alcohol, or recovering from the effects of alcohol (hangover).  A strong desire or urge to use alcohol (cravings).   Continued use of alcohol despite problems at work, school, or home because of alcohol use.   Continued use of alcohol despite problems in relationships because of alcohol use.  Continued use of alcohol in situations when it is physically hazardous, such as driving a car.  Continued use of alcohol despite awareness of a physical or psychological problem that is likely related to alcohol use. Physical problems related to alcohol use can involve the brain, heart, liver, stomach, and intestines. Psychological problems related to alcohol use include intoxication, depression, anxiety, psychosis, delirium, and dementia.   The need for  increased amounts of alcohol to achieve the same desired effect, or a decreased effect from the consumption of the same amount of alcohol (tolerance).  Withdrawal symptoms upon reducing or stopping alcohol use, or alcohol use to reduce or avoid withdrawal symptoms. Withdrawal symptoms include:  Racing heart.  Hand tremor.  Difficulty sleeping.  Nausea.  Vomiting.  Hallucinations.  Restlessness.  Seizures. DIAGNOSIS Alcohol use disorder is diagnosed through an assessment by your caregiver. Your caregiver may start by asking three or four questions to screen for excessive or problematic alcohol use. To confirm a diagnosis of alcohol use disorder, at least two symptoms (see SYMPTOMS) must be present within a 41-month period. The severity of alcohol use disorder depends on the number of symptoms:  Mild two or three.  Moderate four or five.  Severe six or more. Your caregiver may perform a physical exam or use results from lab tests to see if you have physical problems resulting from alcohol use. Your caregiver may refer you to a mental health professional for evaluation. TREATMENT  Some people with alcohol use disorder are able to reduce their alcohol use to low-risk levels. Some people with alcohol use disorder need to quit drinking alcohol. When necessary, mental health professionals with specialized training in substance use treatment can help. Your caregiver can help you decide how severe your alcohol use disorder is and what type of treatment you need. The following forms of treatment are available:   Detoxification. Detoxification involves the use of prescription medication to prevent alcohol withdrawal symptoms in the first week after quitting. This is important for people with a history of symptoms of withdrawal and for heavy  drinkers who are likely to have withdrawal symptoms. Alcohol withdrawal can be dangerous and, in severe cases, cause death. Detoxification is usually provided  in a hospital or in-patient substance use treatment facility.  Counseling or talk therapy. Talk therapy is provided by substance use treatment counselors. It addresses the reasons people use alcohol and ways to keep them from drinking again. The goals of talk therapy are to help people with alcohol use disorder find healthy activities and ways to cope with life stress, to identify and avoid triggers for alcohol use, and to handle cravings, which can cause relapse.  Medication.Different medications can help treat alcohol use disorder through the following actions:  Decrease alcohol cravings.  Decrease the positive reward response felt from alcohol use.  Produce an uncomfortable physical reaction when alcohol is used (aversion therapy).  Support groups. Support groups are run by people who have quit drinking. They provide emotional support, advice, and guidance. These forms of treatment are often combined. Some people with alcohol use disorder benefit from intensive combination treatment provided by specialized substance use treatment centers. Both inpatient and outpatient treatment programs are available. Document Released: 09/21/2004 Document Revised: 04/16/2013 Document Reviewed: 11/21/2012 [facility] Patient Information 2014 [place].  Emergency Department Resource Guide 1) Find a Doctor and Pay Out of Pocket Although you won't have to find out who is covered by your insurance plan, it is a good idea to ask around and get recommendations. You will then need to call the office and see if the doctor you have chosen will accept you as a new patient and what types of options they offer for patients who are self-pay. Some doctors offer discounts or will set up payment plans for their patients who do not have insurance, but you will need to ask so you aren't surprised when you get to your appointment.  2) Contact Your Local Health Department Not all health departments have doctors that can  see patients for sick visits, but many do, so it is worth a call to see if yours does. If you don't know where your local health department is, you can check in your phone book. The CDC also has a tool to help you locate your state's health department, and many state websites also have listings of all of their local health departments.  3) Find a [facility] If your illness is not likely to be very severe or complicated, you may want to try a walk in clinic. These are popping up all over the country in pharmacies, drugstores, and shopping centers. They're usually staffed by nurse practitioners or physician assistants that have been trained to treat common illnesses and complaints. They're usually fairly quick and inexpensive. However, if you have serious medical issues or chronic medical problems, these are probably not your best option.  No Primary Care Doctor: - Call Health Connect at  [phone] - they can help you locate a primary care doctor that  accepts your insurance, provides certain services, etc. - Physician Referral Service- [phone]  Chronic Pain Problems: Organization         Address  Phone   Notes  [facility]  [phone] Patients need to be referred by their primary care doctor.   Medication Assistance: Organization         Address  Phone   Notes  [facility] [address] [place], [place] [address] [phone] --Must be a resident of [facility] -- Must have  NO insurance coverage whatsoever (no Medicaid/ Medicare, etc.) -- The pt. MUST have a primary care doctor that directs their care regularly and follows them in the community   [facility]ist  [phone]   [employer]  [phone]    Agencies that provide inexpensive medical care: Organization         Address  Phone   Notes  [place]  [phone]   [facility]    [phone]   [facility] [place], [place] [address] [phone]   [place] [address] [place] [phone]   Planned Parenthood    [phone]   [facility]    [phone]   [place] and [place] [address], [place] Phone:  [phone], Fax:  [phone] Hours of Operation:  9 am - 6 pm, M-F.  Also accepts Medicaid/Medicare and self-pay.  [facility] for [place], [address], [place] Phone: [phone], Fax: [phone]. Hours of Operation:  8:30 am - 5:30 pm, M-F.  Also accepts Medicaid and self-pay.  [facility] [place] [address] [place] Phone: [phone]   [place], [place], [place] [phone], Ext. [phone] Mondays & Thursdays: 7-9 AM.  First 15 patients are seen on a first come, first serve basis.    [place] Providers:  Organization         Address  Phone   Notes  [facility] [address], [place] [phone] Also accepts self-pay patients.  [facility] [address], [place]  [phone]   [place], [address], [place] [phone]   [facility] [address], [place] [phone]   Lucianne Lei [address], [place]   [phone] Only accepts [place] Access [place] patients after they have their name applied to their card.   Self-Pay (no insurance) in [facility]:  Organization         Address  Phone   Notes  Sickle Cell Patients, [facility] [place] [phone]   [facility] [place] [phone]   [facility] [place]  [place], [place], [place] [phone]   [facility]/Dr. Osei-Bonsu   [address] [place] or [address], [place] [phone] Phone number for both [place] and [place] locations is the same.  Urgent Medical and [facility] [address] [place] [phone]   [facility] [address] [place] or [address] [phone]   [facility] [address] [place] [phone], phone; [phone], fax Sees patients 1st and 3rd Saturday of every month.  Must not qualify for public or private insurance (i.e. Medicaid, Medicare, [place] Health Choice, Veterans' Benefits)  Household income should be no more than 200% of the poverty level The clinic cannot treat you if you are pregnant or think you are pregnant  Sexually transmitted diseases are not treated at the clinic.    Dental Care: Organization         Address  Phone  Notes  [place]  [facility] [address] [place] [phone] Accepts children up to age 44 who are enrolled in [place] or [place]; pregnant women with a Medicaid card; and children who have applied for Medicaid or [place] Health Choice, but were declined, whose parents can pay a reduced fee at time of service.  [facility]  [address], [place] [phone] Accepts children up to age 22 who are enrolled in [place] or [place]; pregnant women with a Medicaid card; and children who have applied for Medicaid or [place] Health Choice, but were declined, whose parents can pay a reduced fee at time of service.  [facility] Adult [facility] Access PROGRAM  [place] [phone] Patients are seen by appointment only. Walk-ins are not accepted. [place] will see patients 66 years of age and older. Monday - Tuesday (8am-5pm) Most Wednesdays (8:30-5pm) $30 per visit, cash only  [facility] Adult Dental Access  PROGRAM  [address], [facility] [phone] Patients are seen by appointment only. Walk-ins are not accepted. [place] will see patients 61 years of age and older. One Wednesday Evening (Monthly: Volunteer Based).  $30 per visit, cash only  [place]  [phone] for adults; Children under age 23, call Graduate Pediatric Dentistry at [phone]. Children aged 65-14, please call [phone] to request a pediatric application.  Dental services are provided in all areas of dental care including fillings, crowns and bridges, complete and partial dentures, implants, gum treatment, root canals, and extractions. Preventive care is also provided. Treatment is provided to both adults and children. Patients are selected via a lottery and there is often a waiting list.   [facility] [address] [place]  [phone] [email or web address]   [facility] [address] [place], [place] [phone], Ext. [phone] Second and Fourth Thursday of each month, opens at 6:30 AM; Clinic ends at 9 AM.  Patients are seen on a first-come first-served basis, and a limited number are seen during each clinic.   [facility]  [address] Hillard Danker [place], [place] [phone]   Eligibility Requirements You must have lived in [place], [place], or [place] counties for at least the last three months.   You cannot be eligible for state or federal sponsored [employer], including [employer], [place], or [employer].   You generally cannot be eligible for healthcare insurance through your employer.    How to apply: Eligibility screenings are held every Tuesday and Wednesday afternoon from 1:00 pm until 4:00 pm. You do not need an appointment for the interview!  [facility] [address] [place], [place]   [place]  [place] Department   [place]  [phone]    Behavioral Health Resources in the Community: Intensive Outpatient Programs Organization         Address  Phone  Notes  [place]. [address] [place], [place] [phone]   [facility] Outpatient [address] [place], [place]   ADS: Alcohol & Drug Svcs [address], [place], [place]   [place] [address]  [place], [place] or [phone]   Substance Abuse Resources Organization  Address  Phone  Notes  Alcohol and Drug Services  [phone]   [place]  [phone]   [facility] [place]  [phone]   Chinita Pester  [phone]   Residential & Outpatient Substance Abuse Program  [phone]   Psychological Services Organization         Address  Phone  Notes  [facility] [place]  [place]  [phone]   [place] [address], [place] or [phone]    Mobile Crisis Teams Organization         Address  Phone  Notes  [facility], Mobile Crisis Care Unit  [phone]   [facility]ive Psychotherapeutic Services  [address] [place], [place]   [place] [address], [place] [phone]    Self-Help/Support Groups Organization         Address  Phone             Notes  [place] of [place] - variety of support groups  [place] Call for more information  Narcotics Anonymous (NA), [facility] [address] [place] [address], [employer] [place]  2 meetings at this location   Special educational needs teacher         Address  Phone  Notes  [facility] Residential Treatment [place],    [place]  [phone]   [facility]  [address], [place] [phone], [place], [place]   [place], [place] [phone] Admissions: 8am-3pm M-F  Incentives Substance [place] [address],    [place], [place] [phone]   [facility] [address] [place], [place], [place]   [facility] [address]  [place], [place]   [facility] Programs - Intensive Outpatient [address] Kristeen [address], [place], [place]   [facility] [address]  [place], [place] [phone] or [phone]   [facility] Services (RTS) [address] [place], [place] Accepts Medicaid  [facility] [place] [address]  [place] [phone] Substance Abuse/Addiction Treatment   [facility] Organization         Address  Phone  Notes  [facility]  [phone]   Domenic Schwab, PhD [address] [place]lis [place], [place]   [phone] or [phone]   [place], [place] [phone]   [facility] [address] [place], [place] [phone] Insurance/Medicaid/sponsorship through [facility] and Families [address]                                    [place], [place] [phone] [place] [address], [place] [phone]    Dr. Adele Schilder  [phone]   [facility] Dept. 1) [address], [place] 2) [place] 3)  [place] [address], [place] [phone]  [phone]   [place] [phone] or [phone] (After Hours)

## 2013-09-02 NOTE — Progress Notes (Signed)
   CARE MANAGEMENT ED NOTE 09/02/2013  Patient:  Mario Andrews, Mario Andrews   Account Number:  [id number]  Date Initiated:  09/02/2013  Documentation initiated by:  Livia Snellen  Subjective/Objective Assessment:   Patient presents to Ed with wanting detox from alcohol.     Subjective/Objective Assessment Detail:   Patient awke alert Ox3.  Patient is afebrile. Patient drinks 3-4 40oz bottles a day.     Action/Plan:   Action/Plan Detail:   Anticipated DC Date:       Status Recommendation to Physician:   Result of Recommendation:    Other ED [place]  Other  PCP issues    Choice offered to / List presented to:            Status of service:  Completed, signed off  ED Comments:   ED Comments Detail:  Patient confirms he does not have a pcp.  Patient reports he has Medicaid insurance.  [facility] provided patient with a list of pcps who accept Medicaid insurance in [place].  Patient thankful for resources.

## 2013-09-04 NOTE — Progress Notes (Signed)
Patient Discharge Instructions:  After Visit Summary (AVS):   Faxed to:  09/04/13 Discharge Summary Note:   Faxed to:  09/04/13 Psychiatric Admission Assessment Note:   Faxed to:  09/04/13 Suicide Risk Assessment - Discharge Assessment:   Faxed to:  09/04/13 Faxed/Sent to the Next Level Care provider:  09/04/13 Faxed to ADS @ [place], 09/04/2013, 3:51 PM

## 2013-09-09 ENCOUNTER — Emergency Department (HOSPITAL_COMMUNITY)
Admission: EM | Admit: 2013-09-09 | Discharge: 2013-09-09 | Disposition: A | Discharge status: Home / Self Care | Payer: Medicaid Other | Attending: Emergency Medicine | Admitting: Emergency Medicine

## 2013-09-09 ENCOUNTER — Emergency Department (HOSPITAL_COMMUNITY): Payer: Medicaid Other

## 2013-09-09 DIAGNOSIS — F329 Major depressive disorder, single episode, unspecified: Secondary | ICD-10-CM | POA: Insufficient documentation

## 2013-09-09 DIAGNOSIS — F1021 Alcohol dependence, in remission: Secondary | ICD-10-CM | POA: Insufficient documentation

## 2013-09-09 DIAGNOSIS — Z8673 Personal history of transient ischemic attack (TIA), and cerebral infarction without residual deficits: Secondary | ICD-10-CM | POA: Insufficient documentation

## 2013-09-09 DIAGNOSIS — J449 Chronic obstructive pulmonary disease, unspecified: Secondary | ICD-10-CM

## 2013-09-09 DIAGNOSIS — R1110 Vomiting, unspecified: Secondary | ICD-10-CM | POA: Insufficient documentation

## 2013-09-09 DIAGNOSIS — F419 Anxiety disorder, unspecified: Secondary | ICD-10-CM

## 2013-09-09 DIAGNOSIS — F10939 Alcohol use, unspecified with withdrawal, unspecified: Secondary | ICD-10-CM

## 2013-09-09 DIAGNOSIS — F17200 Nicotine dependence, unspecified, uncomplicated: Secondary | ICD-10-CM | POA: Insufficient documentation

## 2013-09-09 DIAGNOSIS — F3289 Other specified depressive episodes: Secondary | ICD-10-CM | POA: Insufficient documentation

## 2013-09-09 DIAGNOSIS — R002 Palpitations: Secondary | ICD-10-CM | POA: Insufficient documentation

## 2013-09-09 DIAGNOSIS — D179 Benign lipomatous neoplasm, unspecified: Secondary | ICD-10-CM

## 2013-09-09 DIAGNOSIS — J441 Chronic obstructive pulmonary disease with (acute) exacerbation: Secondary | ICD-10-CM | POA: Insufficient documentation

## 2013-09-09 DIAGNOSIS — F411 Generalized anxiety disorder: Secondary | ICD-10-CM | POA: Insufficient documentation

## 2013-09-09 DIAGNOSIS — D1739 Benign lipomatous neoplasm of skin and subcutaneous tissue of other sites: Secondary | ICD-10-CM | POA: Insufficient documentation

## 2013-09-09 DIAGNOSIS — Z8669 Personal history of other diseases of the nervous system and sense organs: Secondary | ICD-10-CM | POA: Insufficient documentation

## 2013-09-09 DIAGNOSIS — F10239 Alcohol dependence with withdrawal, unspecified: Secondary | ICD-10-CM | POA: Insufficient documentation

## 2013-09-09 LAB — COMPREHENSIVE METABOLIC PANEL
ALBUMIN: 3.3 g/dL — AB (ref 3.5–5.2)
ALK PHOS: 137 U/L — AB (ref 39–117)
ALT: 76 U/L — ABNORMAL HIGH (ref 0–53)
AST: 124 U/L — ABNORMAL HIGH (ref 0–37)
BUN: 12 mg/dL (ref 6–23)
CALCIUM: 8.7 mg/dL (ref 8.4–10.5)
CO2: 28 mEq/L (ref 19–32)
Chloride: 101 mEq/L (ref 96–112)
Creatinine, Ser: 0.79 mg/dL (ref 0.50–1.35)
GFR calc non Af Amer: >90 mL/min (ref >90)
GLUCOSE: 125 mg/dL — AB (ref 70–99)
POTASSIUM: 4.7 meq/L (ref 3.7–5.3)
Sodium: 139 mEq/L (ref 137–147)
TOTAL PROTEIN: 6.8 g/dL (ref 6.0–8.3)
Total Bilirubin: 0.4 mg/dL (ref 0.3–1.2)

## 2013-09-09 LAB — CBC WITH DIFFERENTIAL/PLATELET
BASOS PCT: 1 % (ref 0–1)
Basophils Absolute: 0.1 10*3/uL (ref 0.0–0.1)
EOS ABS: 0.3 10*3/uL (ref 0.0–0.7)
EOS PCT: 7 % — AB (ref 0–5)
HCT: 36.6 % — ABNORMAL LOW (ref 39.0–52.0)
Hemoglobin: 12.7 g/dL — ABNORMAL LOW (ref 13.0–17.0)
LYMPHS ABS: 1.7 10*3/uL (ref 0.7–4.0)
Lymphocytes Relative: 41 % (ref 12–46)
MCH: 31.1 pg (ref 26.0–34.0)
MCHC: 34.7 g/dL (ref 30.0–36.0)
MCV: 89.5 fL (ref 78.0–100.0)
Monocytes Absolute: 0.6 10*3/uL (ref 0.1–1.0)
Monocytes Relative: 15 % — ABNORMAL HIGH (ref 3–12)
NEUTROS PCT: 36 % — AB (ref 43–77)
Neutro Abs: 1.5 10*3/uL — ABNORMAL LOW (ref 1.7–7.7)
Platelets: 186 10*3/uL (ref 150–400)
RBC: 4.09 MIL/uL — ABNORMAL LOW (ref 4.22–5.81)
RDW: 14.7 % (ref 11.5–15.5)
WBC: 4.2 10*3/uL (ref 4.0–10.5)

## 2013-09-09 LAB — RAPID URINE DRUG SCREEN, HOSP PERFORMED
Amphetamines: NOT DETECTED
Barbiturates: NOT DETECTED
Benzodiazepines: POSITIVE — AB
Cocaine: POSITIVE — AB
OPIATES: NOT DETECTED
TETRAHYDROCANNABINOL: NOT DETECTED

## 2013-09-09 LAB — ETHANOL

## 2013-09-09 LAB — LIPASE, BLOOD: LIPASE: 24 U/L (ref 11–59)

## 2013-09-09 LAB — POCT I-STAT TROPONIN I: TROPONIN I, POC: 0 ng/mL (ref 0.00–0.08)

## 2013-09-09 MED ORDER — ALBUTEROL SULFATE HFA 108 (90 BASE) MCG/ACT IN AERS
2.0000 | INHALATION_SPRAY | Freq: Once | RESPIRATORY_TRACT | Status: DC
  Filled 2013-09-09: qty 6.7

## 2013-09-09 MED ORDER — LORAZEPAM 1 MG PO TABS: 1.0000 mg | ORAL_TABLET | Freq: Four times a day (QID) | ORAL | Status: DC | PRN

## 2013-09-09 MED ORDER — SODIUM CHLORIDE 0.9 % IV BOLUS (SEPSIS)
1000.0000 mL | Freq: Once | INTRAVENOUS | Status: AC
  Administered 2013-09-09: 1000 mL via INTRAVENOUS

## 2013-09-09 MED ORDER — ONDANSETRON HCL 4 MG/2ML IJ SOLN
4.0000 mg | Freq: Once | INTRAMUSCULAR | Status: AC
  Administered 2013-09-09: 4 mg via INTRAVENOUS
  Filled 2013-09-09: qty 2

## 2013-09-09 MED ORDER — LORAZEPAM 2 MG/ML IJ SOLN
1.0000 mg | Freq: Once | INTRAMUSCULAR | Status: AC
  Administered 2013-09-09: 1 mg via INTRAVENOUS
  Filled 2013-09-09: qty 1

## 2013-09-09 MED ORDER — ALBUTEROL SULFATE (2.5 MG/3ML) 0.083% IN NEBU
5.0000 mg | INHALATION_SOLUTION | Freq: Once | RESPIRATORY_TRACT | Status: AC
  Administered 2013-09-09: 5 mg via RESPIRATORY_TRACT
  Filled 2013-09-09: qty 6

## 2013-09-09 NOTE — Discharge Instructions (Signed)
Take albuterol every 4 hrs as needed for shortness of breath.   Stop smoking.   Take ativan as needed for shakiness, anxiety.   Follow up with your doctor.   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   Stop drinking alcohol.   Return to ER if you have vomiting, fever, worse shakiness, seizure activity, trouble breathing.

## 2013-09-09 NOTE — ED Provider Notes (Signed)
CSN: [id number]     Arrival date & time 09/09/13  0856 History   First MD Initiated Contact with Patient 09/09/13 0900     Chief Complaint  Patient presents with  . Shortness of Breath   (Consider location/radiation/quality/duration/timing/severity/associated sxs/prior Treatment) The history is provided by the patient.  Mario Andrews is a 53 y.o. male hx of depression, anxiety, COPD but still smokes, here presenting with shortness of breath. Shortness of breath since yesterday. He still smokes half a pack a day. Denies any chest pain or cough. Also vomited twice but denies any abdominal pain or fever. He also felt anxious and had some palpitations at home. He has been taking trazodone with no relief. Came here 2 weeks ago for ETOH and cocaine detox. States that hasn't been using cocaine and last alcohol use was 3 days ago.    Past Medical History  Diagnosis Date  . Anxiety   . Depression   . COPD (chronic obstructive pulmonary disease)   . Seizures     last seizure 2 months ago  . Stroke     2009  . Alcohol abuse   . Cocaine abuse    Past Surgical History  Procedure Laterality Date  . No past surgeries     No family history on file. History  Substance Use Topics  . Smoking status: Current Every Day Smoker -- 1.00 packs/day for 18 years    Types: Cigarettes  . Smokeless tobacco: Never Used  . Alcohol Use: Yes     Comment: 4 qts/day     Review of Systems  Respiratory: Positive for shortness of breath.   Gastrointestinal: Positive for vomiting.  All other systems reviewed and are negative.    Allergies  Peanut-containing drug products and Tramadol  Home Medications   Current Outpatient Rx  Name  Route  Sig  Dispense  Refill  . traZODone (DESYREL) 50 MG tablet   Oral   Take 1 tablet (50 mg total) by mouth at bedtime as needed for sleep.   30 tablet   0    BP 130/75  Pulse 76  Temp(Src) 98.4 F (36.9 C) (Oral)  Resp 17  SpO2 96% Physical Exam  Nursing note  and vitals reviewed. Constitutional: He is oriented to person, place, and time.  Disheveled   HENT:  Head: Normocephalic.  Mouth/Throat: Oropharynx is clear and moist.  Eyes: Conjunctivae are normal. Pupils are equal, round, and reactive to light.  Neck: Normal range of motion. Neck supple.  Cardiovascular: Regular rhythm and normal heart sounds.   Slightly tachy  Pulmonary/Chest: Effort normal.  Dec air movement, no wheezing   Abdominal: Soft. Bowel sounds are normal. He exhibits no distension. There is no tenderness. There is no rebound and no guarding.  Musculoskeletal: Normal range of motion. He exhibits no edema and no tenderness.  Neurological: He is alert and oriented to person, place, and time. No cranial nerve deficit. Coordination normal.  Skin: Skin is warm and dry.  Small lipoma on upper back   Psychiatric: He has a normal mood and affect. His behavior is normal. Judgment and thought content normal.    ED Course  Procedures (including critical care time) Labs Review Labs Reviewed  CBC WITH DIFFERENTIAL - Abnormal; Notable for the following:    RBC 4.09 (*)    Hemoglobin 12.7 (*)    HCT 36.6 (*)    Neutrophils Relative % 36 (*)    Neutro Abs 1.5 (*)    Monocytes  Relative 15 (*)    Eosinophils Relative 7 (*)    All other components within normal limits  COMPREHENSIVE METABOLIC PANEL - Abnormal; Notable for the following:    Glucose, Bld 125 (*)    Albumin 3.3 (*)    AST 124 (*)    ALT 76 (*)    Alkaline Phosphatase 137 (*)    All other components within normal limits  URINE RAPID DRUG SCREEN (HOSP PERFORMED) - Abnormal; Notable for the following:    Cocaine POSITIVE (*)    Benzodiazepines POSITIVE (*)    All other components within normal limits  ETHANOL  LIPASE, BLOOD  POCT I-STAT TROPONIN I   Imaging Review Dg Chest 2 View  09/09/2013   CLINICAL DATA:  Short of breath  EXAM: CHEST  2 VIEW  COMPARISON:  10/12/2012  FINDINGS: Pulmonary hyperinflation.  Negative for pneumonia. Heart size and vascularity are normal. No pleural effusion. Apical blebs.  IMPRESSION: Pulmonary hyperinflation.  No acute cardiopulmonary abnormality.   Electronically Signed   By: Franchot Gallo M.D.   On: 09/09/2013 10:03    EKG Interpretation    Date/Time:  Tuesday September 09 2013 09:07:39 EST Ventricular Rate:  88 PR Interval:  165 QRS Duration: 79 QT Interval:  346 QTC Calculation: 419 R Axis:   81 Text Interpretation:  Sinus rhythm Anteroseptal infarct, age indeterminate Minimal ST elevation, inferior leads No significant change since last tracing Confirmed by YAO  MD, Teige [phone]) on 09/09/2013 9:38:59 AM            MDM  No diagnosis found. Mario Andrews is a 53 y.o. male here with SOB, anxiety. Likely COPD exacerbation vs alcohol withdrawal. I doubt cardiac cause or dissection or PE. Will give neb and check labs and cxr and reassess.   11:25 AM Labs at baseline. LFT elevated but similar to previous, likely from alcohol use. Positive cocaine but patient denies recent use. Trop neg x 1. I doubt ACS. Improved air movement after neb. Will d/c home with albuterol prn and ativan prn for anxiety vs mild alcohol withdrawal. Not tachycardic and no hx of withdrawal seizures.   Wandra Arthurs, MD 09/09/13 [phone]

## 2013-09-09 NOTE — ED Notes (Signed)
Per EMS. Patient c/o of SOB. Patient has history of anxiety, was seen 2 weeks ago for same. Patient c/o vomitng x 2 , no fever. Patient told EMS that medicine for anxiety helped while he was her but did not help at home.

## 2013-09-09 NOTE — ED Notes (Signed)
Bed: CJ67 Expected date:  Expected time:  Means of arrival:  Comments: SOB

## 2013-09-14 ENCOUNTER — Encounter (HOSPITAL_COMMUNITY): Payer: Self-pay | Admitting: Emergency Medicine

## 2013-09-14 ENCOUNTER — Emergency Department (HOSPITAL_COMMUNITY)
Admission: EM | Admit: 2013-09-14 | Discharge: 2013-09-16 | Disposition: A | Discharge status: Home / Self Care | Payer: Medicaid Other | Attending: Emergency Medicine | Admitting: Emergency Medicine

## 2013-09-14 ENCOUNTER — Emergency Department (HOSPITAL_COMMUNITY)
Admission: EM | Admit: 2013-09-14 | Discharge: 2013-09-14 | Disposition: A | Discharge status: Home / Self Care | Payer: Medicaid Other | Source: Home / Self Care | Attending: Emergency Medicine | Admitting: Emergency Medicine

## 2013-09-14 DIAGNOSIS — J449 Chronic obstructive pulmonary disease, unspecified: Secondary | ICD-10-CM | POA: Insufficient documentation

## 2013-09-14 DIAGNOSIS — F3289 Other specified depressive episodes: Secondary | ICD-10-CM | POA: Insufficient documentation

## 2013-09-14 DIAGNOSIS — F411 Generalized anxiety disorder: Secondary | ICD-10-CM

## 2013-09-14 DIAGNOSIS — F1020 Alcohol dependence, uncomplicated: Secondary | ICD-10-CM

## 2013-09-14 DIAGNOSIS — J069 Acute upper respiratory infection, unspecified: Secondary | ICD-10-CM | POA: Insufficient documentation

## 2013-09-14 DIAGNOSIS — F17200 Nicotine dependence, unspecified, uncomplicated: Secondary | ICD-10-CM | POA: Insufficient documentation

## 2013-09-14 DIAGNOSIS — Z8669 Personal history of other diseases of the nervous system and sense organs: Secondary | ICD-10-CM | POA: Insufficient documentation

## 2013-09-14 DIAGNOSIS — F329 Major depressive disorder, single episode, unspecified: Secondary | ICD-10-CM | POA: Insufficient documentation

## 2013-09-14 DIAGNOSIS — F1410 Cocaine abuse, uncomplicated: Secondary | ICD-10-CM

## 2013-09-14 DIAGNOSIS — J4489 Other specified chronic obstructive pulmonary disease: Secondary | ICD-10-CM | POA: Insufficient documentation

## 2013-09-14 DIAGNOSIS — Z720 Tobacco use: Secondary | ICD-10-CM

## 2013-09-14 DIAGNOSIS — G40909 Epilepsy, unspecified, not intractable, without status epilepticus: Secondary | ICD-10-CM

## 2013-09-14 DIAGNOSIS — Z8673 Personal history of transient ischemic attack (TIA), and cerebral infarction without residual deficits: Secondary | ICD-10-CM

## 2013-09-14 DIAGNOSIS — F339 Major depressive disorder, recurrent, unspecified: Secondary | ICD-10-CM

## 2013-09-14 DIAGNOSIS — R1110 Vomiting, unspecified: Secondary | ICD-10-CM

## 2013-09-14 DIAGNOSIS — F1010 Alcohol abuse, uncomplicated: Secondary | ICD-10-CM

## 2013-09-14 DIAGNOSIS — R45851 Suicidal ideations: Secondary | ICD-10-CM | POA: Insufficient documentation

## 2013-09-14 LAB — LIPASE, BLOOD: LIPASE: 31 U/L (ref 11–59)

## 2013-09-14 LAB — COMPREHENSIVE METABOLIC PANEL
ALBUMIN: 3.8 g/dL (ref 3.5–5.2)
ALT: 65 U/L — ABNORMAL HIGH (ref 0–53)
AST: 101 U/L — AB (ref 0–37)
Alkaline Phosphatase: 114 U/L (ref 39–117)
BUN: 7 mg/dL (ref 6–23)
CALCIUM: 8.6 mg/dL (ref 8.4–10.5)
CO2: 27 mEq/L (ref 19–32)
CREATININE: 0.76 mg/dL (ref 0.50–1.35)
Chloride: 99 mEq/L (ref 96–112)
GFR calc Af Amer: >90 mL/min (ref >90)
GFR calc non Af Amer: >90 mL/min (ref >90)
Glucose, Bld: 82 mg/dL (ref 70–99)
Potassium: 4.4 mEq/L (ref 3.7–5.3)
Sodium: 138 mEq/L (ref 137–147)
TOTAL PROTEIN: 7.8 g/dL (ref 6.0–8.3)
Total Bilirubin: 0.3 mg/dL (ref 0.3–1.2)

## 2013-09-14 LAB — CBC WITH DIFFERENTIAL/PLATELET
BASOS ABS: 0 10*3/uL (ref 0.0–0.1)
Basophils Relative: 1 % (ref 0–1)
EOS ABS: 0.3 10*3/uL (ref 0.0–0.7)
Eosinophils Relative: 4 % (ref 0–5)
HEMATOCRIT: 37.4 % — AB (ref 39.0–52.0)
HEMOGLOBIN: 12.9 g/dL — AB (ref 13.0–17.0)
Lymphocytes Relative: 43 % (ref 12–46)
Lymphs Abs: 2.8 10*3/uL (ref 0.7–4.0)
MCH: 30.5 pg (ref 26.0–34.0)
MCHC: 34.5 g/dL (ref 30.0–36.0)
MCV: 88.4 fL (ref 78.0–100.0)
MONO ABS: 0.3 10*3/uL (ref 0.1–1.0)
MONOS PCT: 5 % (ref 3–12)
NEUTROS ABS: 3 10*3/uL (ref 1.7–7.7)
Neutrophils Relative %: 47 % (ref 43–77)
Platelets: 195 10*3/uL (ref 150–400)
RBC: 4.23 MIL/uL (ref 4.22–5.81)
RDW: 14.5 % (ref 11.5–15.5)
WBC: 6.4 10*3/uL (ref 4.0–10.5)

## 2013-09-14 LAB — URINALYSIS, ROUTINE W REFLEX MICROSCOPIC
Bilirubin Urine: NEGATIVE
GLUCOSE, UA: NEGATIVE mg/dL
Hgb urine dipstick: NEGATIVE
Ketones, ur: NEGATIVE mg/dL
LEUKOCYTES UA: NEGATIVE
Nitrite: NEGATIVE
PH: 6 (ref 5.0–8.0)
Protein, ur: NEGATIVE mg/dL
SPECIFIC GRAVITY, URINE: 1.004 — AB (ref 1.005–1.030)
Urobilinogen, UA: 0.2 mg/dL (ref 0.0–1.0)

## 2013-09-14 MED ORDER — NICOTINE 21 MG/24HR TD PT24
21.0000 mg | MEDICATED_PATCH | Freq: Every day | TRANSDERMAL | Status: DC
Start: 2013-09-15 — End: 2013-09-16

## 2013-09-14 MED ORDER — LORAZEPAM 1 MG PO TABS: 1.0000 mg | ORAL_TABLET | Freq: Three times a day (TID) | ORAL | Status: DC | PRN

## 2013-09-14 MED ORDER — IBUPROFEN 200 MG PO TABS
600.0000 mg | ORAL_TABLET | Freq: Three times a day (TID) | ORAL | Status: DC | PRN
  Administered 2013-09-15: 600 mg via ORAL
  Filled 2013-09-14: qty 3

## 2013-09-14 MED ORDER — ZOLPIDEM TARTRATE 5 MG PO TABS: 5.0000 mg | ORAL_TABLET | Freq: Every evening | ORAL | Status: DC | PRN

## 2013-09-14 NOTE — ED Provider Notes (Signed)
CSN: [id number]     Arrival date & time 09/14/13  1719 History   First MD Initiated Contact with Patient 09/14/13 1901     Chief Complaint  Patient presents with  . Emesis   (Consider location/radiation/quality/duration/timing/severity/associated sxs/prior Treatment) Patient is a 53 y.o. male presenting with vomiting. The history is provided by the patient. No language interpreter was used.  Emesis Severity:  Mild Duration:  1 day Number of daily episodes:  1 episode today Associated symptoms: no chills   Associated symptoms comment:  He states he presents with complaint of cough. No known fever. He has an inhaler that was given to him in the emergency department 5 days ago. He continues to smoke. He states he spends most of his day outdoors in the cold.    Past Medical History  Diagnosis Date  . Anxiety   . Depression   . COPD (chronic obstructive pulmonary disease)   . Seizures     last seizure 2 months ago  . Stroke     2009  . Alcohol abuse   . Cocaine abuse    Past Surgical History  Procedure Laterality Date  . No past surgeries     History reviewed. No pertinent family history. History  Substance Use Topics  . Smoking status: Current Every Day Smoker -- 1.00 packs/day for 18 years    Types: Cigarettes  . Smokeless tobacco: Never Used  . Alcohol Use: Yes     Comment: 4 qts/day     Review of Systems  Constitutional: Negative for fever and chills.  HENT: Negative.   Respiratory: Negative.  Negative for cough.   Cardiovascular: Negative.   Gastrointestinal: Positive for vomiting.  Musculoskeletal: Negative.   Skin: Negative.   Neurological: Negative.     Allergies  Peanut-containing drug products and Tramadol  Home Medications   Current Outpatient Rx  Name  Route  Sig  Dispense  Refill  . LORazepam (ATIVAN) 1 MG tablet   Oral   Take 1 tablet (1 mg total) by mouth every 6 (six) hours as needed for anxiety.   8 tablet   0   . traZODone (DESYREL) 50  MG tablet   Oral   Take 1 tablet (50 mg total) by mouth at bedtime as needed for sleep.   30 tablet   0    BP 103/65  Pulse 86  Temp(Src) 97.7 F (36.5 C) (Oral)  Resp 16  SpO2 95% Physical Exam  Constitutional: He is oriented to person, place, and time. He appears well-developed and well-nourished.  HENT:  Head: Normocephalic.  Mouth/Throat: Oropharynx is clear and moist.  Eyes: Conjunctivae are normal.  Neck: Normal range of motion. Neck supple.  Cardiovascular: Normal rate and regular rhythm.   No murmur heard. Pulmonary/Chest: Effort normal and breath sounds normal. He has no wheezes. He has no rales. He exhibits no tenderness.  Abdominal: Soft. Bowel sounds are normal. There is no tenderness. There is no rebound and no guarding.  Musculoskeletal: Normal range of motion.  Neurological: He is alert and oriented to person, place, and time.  Skin: Skin is warm and dry. No rash noted.  Psychiatric: He has a normal mood and affect.    ED Course  Procedures (including critical care time) Labs Review Labs Reviewed  CBC WITH DIFFERENTIAL - Abnormal; Notable for the following:    Hemoglobin 12.9 (*)    HCT 37.4 (*)    All other components within normal limits  COMPREHENSIVE METABOLIC PANEL -  Abnormal; Notable for the following:    AST 101 (*)    ALT 65 (*)    All other components within normal limits  URINALYSIS, ROUTINE W REFLEX MICROSCOPIC - Abnormal; Notable for the following:    Specific Gravity, Urine 1.004 (*)    All other components within normal limits  LIPASE, BLOOD   Imaging Review No results found.  EKG Interpretation   None       MDM  No diagnosis found. 1. URI 2. Tobacco abuse 3. Infrequent vomiting  He has been observed for a period of hours. No vomiting. No coughing, no SOB or distress. Lungs clear. He appears hydrated. Meal given. Tolerating meal without difficulty. Feel he is stable for discharge.     Dewaine Oats, PA-C 09/14/13 2201

## 2013-09-14 NOTE — Discharge Instructions (Signed)
Smoking Cessation Quitting smoking is important to your health and has many advantages. However, it is not always easy to quit since nicotine is a very addictive drug. Often times, people try 3 times or more before being able to quit. This document explains the best ways for you to prepare to quit smoking. Quitting takes hard work and a lot of effort, but you can do it. ADVANTAGES OF QUITTING SMOKING  You will live longer, feel better, and live better.  Your body will feel the impact of quitting smoking almost immediately.  Within 20 minutes, blood pressure decreases. Your pulse returns to its normal level.  After 8 hours, carbon monoxide levels in the blood return to normal. Your oxygen level increases.  After 24 hours, the chance of having a heart attack starts to decrease. Your breath, hair, and body stop smelling like smoke.  After 48 hours, damaged nerve endings begin to recover. Your sense of taste and smell improve.  After 72 hours, the body is virtually free of nicotine. Your bronchial tubes relax and breathing becomes easier.  After 2 to 12 weeks, lungs can hold more air. Exercise becomes easier and circulation improves.  The risk of having a heart attack, stroke, cancer, or lung disease is greatly reduced.  After 1 year, the risk of coronary heart disease is cut in half.  After 5 years, the risk of stroke falls to the same as a nonsmoker.  After 10 years, the risk of lung cancer is cut in half and the risk of other cancers decreases significantly.  After 15 years, the risk of coronary heart disease drops, usually to the level of a nonsmoker.  If you are pregnant, quitting smoking will improve your chances of having a healthy baby.  The people you live with, especially any children, will be healthier.  You will have extra money to spend on things other than cigarettes. QUESTIONS TO THINK ABOUT BEFORE ATTEMPTING TO QUIT You may want to talk about your answers with your  caregiver.  Why do you want to quit?  If you tried to quit in the past, what helped and what did not?  What will be the most difficult situations for you after you quit? How will you plan to handle them?  Who can help you through the tough times? Your family? Friends? A caregiver?  What pleasures do you get from smoking? What ways can you still get pleasure if you quit? Here are some questions to ask your caregiver:  How can you help me to be successful at quitting?  What medicine do you think would be best for me and how should I take it?  What should I do if I need more help?  What is smoking withdrawal like? How can I get information on withdrawal? GET READY  Set a quit date.  Change your environment by getting rid of all cigarettes, ashtrays, matches, and lighters in your home, car, or work. Do not let people smoke in your home.  Review your past attempts to quit. Think about what worked and what did not. GET SUPPORT AND ENCOURAGEMENT You have a better chance of being successful if you have help. You can get support in many ways.  Tell your family, friends, and co-workers that you are going to quit and need their support. Ask them not to smoke around you.  Get individual, group, or telephone counseling and support. Programs are available at [employer] and health centers. Call your local health department for  information about programs in your area.  Spiritual beliefs and practices may help some smokers quit.  Download a "quit meter" on your computer to keep track of quit statistics, such as how long you have gone without smoking, cigarettes not smoked, and money saved.  Get a self-help book about quitting smoking and staying off of tobacco. Adams yourself from urges to smoke. Talk to someone, go for a walk, or occupy your time with a task.  Change your normal routine. Take a different route to work. Drink tea instead of coffee.  Eat breakfast in a different place.  Reduce your stress. Take a hot bath, exercise, or read a book.  Plan something enjoyable to do every day. Reward yourself for not smoking.  Explore interactive web-based programs that specialize in helping you quit. GET MEDICINE AND USE IT CORRECTLY Medicines can help you stop smoking and decrease the urge to smoke. Combining medicine with the above behavioral methods and support can greatly increase your chances of successfully quitting smoking.  Nicotine replacement therapy helps deliver nicotine to your body without the negative effects and risks of smoking. Nicotine replacement therapy includes nicotine gum, lozenges, inhalers, nasal sprays, and skin patches. Some may be available over-the-counter and others require a prescription.  Antidepressant medicine helps people abstain from smoking, but how this works is unknown. This medicine is available by prescription.  Nicotinic receptor partial agonist medicine simulates the effect of nicotine in your brain. This medicine is available by prescription. Ask your caregiver for advice about which medicines to use and how to use them based on your health history. Your caregiver will tell you what side effects to look out for if you choose to be on a medicine or therapy. Carefully read the information on the package. Do not use any other product containing nicotine while using a nicotine replacement product.  RELAPSE OR DIFFICULT SITUATIONS Most relapses occur within the first 3 months after quitting. Do not be discouraged if you start smoking again. Remember, most people try several times before finally quitting. You may have symptoms of withdrawal because your body is used to nicotine. You may crave cigarettes, be irritable, feel very hungry, cough often, get headaches, or have difficulty concentrating. The withdrawal symptoms are only temporary. They are strongest when you first quit, but they will go away within  10 14 days. To reduce the chances of relapse, try to:  Avoid drinking alcohol. Drinking lowers your chances of successfully quitting.  Reduce the amount of caffeine you consume. Once you quit smoking, the amount of caffeine in your body increases and can give you symptoms, such as a rapid heartbeat, sweating, and anxiety.  Avoid smokers because they can make you want to smoke.  Do not let weight gain distract you. Many smokers will gain weight when they quit, usually less than 10 pounds. Eat a healthy diet and stay active. You can always lose the weight gained after you quit.  Find ways to improve your mood other than smoking. FOR MORE INFORMATION  www.smokefree.gov  Document Released: 08/08/2001 Document Revised: 02/13/2012 Document Reviewed: 11/23/2011 [facility] Patient Information 2014 [place], [place]. Upper Respiratory Infection, Adult An upper respiratory infection (URI) is also known as the common cold. It is often caused by a type of germ (virus). Colds are easily spread (contagious). You can pass it to others by kissing, coughing, sneezing, or drinking out of the same glass. Usually, you get better in 1 or 2 weeks.  HOME CARE   Only take medicine as told by your doctor.  Use a warm mist humidifier or breathe in steam from a hot shower.  Drink enough water and fluids to keep your pee (urine) clear or pale yellow.  Get plenty of rest.  Return to work when your temperature is back to normal or as told by your doctor. You may use a face mask and wash your hands to stop your cold from spreading. GET HELP RIGHT AWAY IF:   After the first few days, you feel you are getting worse.  You have questions about your medicine.  You have chills, shortness of breath, or brown or red spit (mucus).  You have yellow or brown snot (nasal discharge) or pain in the face, especially when you bend forward.  You have a fever, puffy (swollen) neck, pain when you swallow, or white spots in the  back of your throat.  You have a bad headache, ear pain, sinus pain, or chest pain.  You have a high-pitched whistling sound when you breathe in and out (wheezing).  You have a lasting cough or cough up blood.  You have sore muscles or a stiff neck. MAKE SURE YOU:   Understand these instructions.  Will watch your condition.  Will get help right away if you are not doing well or get worse. Document Released: 01/31/2008 Document Revised: 11/06/2011 Document Reviewed: 12/19/2010 [facility] Patient Information 2014 [place], [place].

## 2013-09-14 NOTE — ED Provider Notes (Signed)
CSN: [id number]     Arrival date & time 09/14/13  2309 History  This chart was scribed for non-physician practitioner Junius Creamer, NP working with Varney Biles, MD by Rolanda Lundborg, ED Scribe. This patient was seen in room WTR8/WTR8 and the patient's care was started at 11:41 PM.     Chief Complaint  Patient presents with  . Suicidal  . Abdominal Pain   The history is provided by the patient. No language interpreter was used.   HPI Comments: Mario Andrews is a 53 y.o. male who presents to the Emergency Department c/o suicidal ideation and needing detox from alcohol. His plan is to cut his wrists. He denies a h/o hurting himself. His last drink was yesterday. He lives with his sister in her apartment. He denies h/o problems when detoxing from alcohol.   Past Medical History  Diagnosis Date  . Anxiety   . Depression   . COPD (chronic obstructive pulmonary disease)   . Seizures     last seizure 2 months ago  . Stroke     2009  . Alcohol abuse   . Cocaine abuse    Past Surgical History  Procedure Laterality Date  . No past surgeries     No family history on file. History  Substance Use Topics  . Smoking status: Current Every Day Smoker -- 1.00 packs/day for 18 years    Types: Cigarettes  . Smokeless tobacco: Never Used  . Alcohol Use: Yes     Comment: 4 qts/day     Review of Systems  Psychiatric/Behavioral: Positive for suicidal ideas.  All other systems reviewed and are negative.    Allergies  Peanut-containing drug products and Tramadol  Home Medications   Current Outpatient Rx  Name  Route  Sig  Dispense  Refill  . LORazepam (ATIVAN) 1 MG tablet   Oral   Take 1 tablet (1 mg total) by mouth every 6 (six) hours as needed for anxiety.   8 tablet   0   . traZODone (DESYREL) 50 MG tablet   Oral   Take 1 tablet (50 mg total) by mouth at bedtime as needed for sleep.   30 tablet   0    BP 92/56  Pulse 62  Temp(Src) 98.3 F (36.8 C) (Oral)  Resp 16  SpO2  95% Physical Exam  Nursing note and vitals reviewed. Constitutional: He is oriented to person, place, and time. He appears well-developed and well-nourished. No distress.  HENT:  Head: Normocephalic and atraumatic.  Eyes: EOM are normal.  Neck: Neck supple. No tracheal deviation present.  Cardiovascular: Normal rate.   Pulmonary/Chest: Effort normal. No respiratory distress.  Musculoskeletal: Normal range of motion.  Neurological: He is alert and oriented to person, place, and time.  Skin: Skin is warm and dry.  Psychiatric: He has a normal mood and affect. His behavior is normal.    ED Course  Procedures (including critical care time) Medications - No data to display  DIAGNOSTIC STUDIES: Oxygen Saturation is 95% on RA, normal by my interpretation.    COORDINATION OF CARE: 11:45 PM- Discussed treatment plan with pt. Pt agrees to plan.    Labs Review Labs Reviewed - No data to display Imaging Review No results found.  EKG Interpretation   None       MDM  No diagnosis found.  Patient has been assessed by behavioral health they do not feel comfortable at this time discharging the patient.  Because of this sudden.  Suicidality.  They request that he be kept until he can be assessed by the psychiatrist in the morning  I personally performed the services described in this documentation, which was scribed in my presence. The recorded information has been reviewed and is accurate.  Garald Balding, NP 09/15/13 [phone]

## 2013-09-14 NOTE — ED Notes (Signed)
Pt is sleeping soundly at this time.  All ordered labs have resulted.

## 2013-09-14 NOTE — ED Notes (Addendum)
Per ems pt c/o nausea vomiting, LLQ abdominal pain upon palpation. Pain 8/10. Reports vomited x2 today.

## 2013-09-15 ENCOUNTER — Inpatient Hospital Stay (HOSPITAL_COMMUNITY)
Admission: AD | Admit: 2013-09-15 | Discharge: 2013-09-15 | Disposition: A | Discharge status: Home / Self Care | Payer: Medicaid Other | Source: Intra-hospital | Attending: Psychiatry | Admitting: Psychiatry

## 2013-09-15 DIAGNOSIS — F1020 Alcohol dependence, uncomplicated: Secondary | ICD-10-CM

## 2013-09-15 DIAGNOSIS — F1010 Alcohol abuse, uncomplicated: Secondary | ICD-10-CM

## 2013-09-15 DIAGNOSIS — F1410 Cocaine abuse, uncomplicated: Secondary | ICD-10-CM

## 2013-09-15 DIAGNOSIS — F339 Major depressive disorder, recurrent, unspecified: Secondary | ICD-10-CM

## 2013-09-15 LAB — CBC WITH DIFFERENTIAL/PLATELET
BASOS PCT: 1 % (ref 0–1)
Basophils Absolute: 0 10*3/uL (ref 0.0–0.1)
Eosinophils Absolute: 0.3 10*3/uL (ref 0.0–0.7)
Eosinophils Relative: 5 % (ref 0–5)
HEMATOCRIT: 36.6 % — AB (ref 39.0–52.0)
HEMOGLOBIN: 12.3 g/dL — AB (ref 13.0–17.0)
LYMPHS PCT: 54 % — AB (ref 12–46)
Lymphs Abs: 3.2 10*3/uL (ref 0.7–4.0)
MCH: 30.6 pg (ref 26.0–34.0)
MCHC: 33.6 g/dL (ref 30.0–36.0)
MCV: 91 fL (ref 78.0–100.0)
MONOS PCT: 6 % (ref 3–12)
Monocytes Absolute: 0.4 10*3/uL (ref 0.1–1.0)
NEUTROS ABS: 2 10*3/uL (ref 1.7–7.7)
NEUTROS PCT: 34 % — AB (ref 43–77)
Platelets: 187 10*3/uL (ref 150–400)
RBC: 4.02 MIL/uL — AB (ref 4.22–5.81)
RDW: 14.6 % (ref 11.5–15.5)
WBC: 5.9 10*3/uL (ref 4.0–10.5)

## 2013-09-15 LAB — RAPID URINE DRUG SCREEN, HOSP PERFORMED
AMPHETAMINES: NOT DETECTED
Barbiturates: NOT DETECTED
Benzodiazepines: POSITIVE — AB
Cocaine: POSITIVE — AB
OPIATES: NOT DETECTED
Tetrahydrocannabinol: NOT DETECTED

## 2013-09-15 LAB — COMPREHENSIVE METABOLIC PANEL
ALBUMIN: 3.3 g/dL — AB (ref 3.5–5.2)
ALK PHOS: 94 U/L (ref 39–117)
ALT: 59 U/L — ABNORMAL HIGH (ref 0–53)
AST: 91 U/L — ABNORMAL HIGH (ref 0–37)
BILIRUBIN TOTAL: 0.3 mg/dL (ref 0.3–1.2)
BUN: 7 mg/dL (ref 6–23)
CHLORIDE: 104 meq/L (ref 96–112)
CO2: 25 meq/L (ref 19–32)
Calcium: 8.3 mg/dL — ABNORMAL LOW (ref 8.4–10.5)
Creatinine, Ser: 0.74 mg/dL (ref 0.50–1.35)
GFR calc Af Amer: >90 mL/min (ref >90)
Glucose, Bld: 93 mg/dL (ref 70–99)
Potassium: 3.9 mEq/L (ref 3.7–5.3)
Sodium: 142 mEq/L (ref 137–147)
Total Protein: 6.9 g/dL (ref 6.0–8.3)

## 2013-09-15 LAB — ETHANOL: Alcohol, Ethyl (B): 189 mg/dL — ABNORMAL HIGH (ref 0–11)

## 2013-09-15 MED ORDER — VITAMIN B-1 100 MG PO TABS
100.0000 mg | ORAL_TABLET | Freq: Every day | ORAL | Status: DC
  Administered 2013-09-15 – 2013-09-16 (×2): 100 mg via ORAL
  Filled 2013-09-15 (×2): qty 1

## 2013-09-15 MED ORDER — THIAMINE HCL 100 MG/ML IJ SOLN
100.0000 mg | Freq: Every day | INTRAMUSCULAR | Status: DC
Start: 2013-09-15 — End: 2013-09-16

## 2013-09-15 MED ORDER — LORAZEPAM 1 MG PO TABS: 0.0000 mg | ORAL_TABLET | Freq: Two times a day (BID) | ORAL | Status: DC

## 2013-09-15 MED ORDER — LORAZEPAM 2 MG/ML IJ SOLN: 1.0000 mg | Freq: Four times a day (QID) | INTRAMUSCULAR | Status: DC | PRN

## 2013-09-15 MED ORDER — LORAZEPAM 1 MG PO TABS
1.0000 mg | ORAL_TABLET | Freq: Four times a day (QID) | ORAL | Status: DC | PRN
  Administered 2013-09-15 – 2013-09-16 (×2): 1 mg via ORAL
  Filled 2013-09-15 (×2): qty 1

## 2013-09-15 MED ORDER — FOLIC ACID 1 MG PO TABS
1.0000 mg | ORAL_TABLET | Freq: Every day | ORAL | Status: DC
  Administered 2013-09-15 – 2013-09-16 (×2): 1 mg via ORAL
  Filled 2013-09-15 (×2): qty 1

## 2013-09-15 MED ORDER — LORAZEPAM 1 MG PO TABS
0.0000 mg | ORAL_TABLET | Freq: Four times a day (QID) | ORAL | Status: DC
  Administered 2013-09-15: 1 mg via ORAL
  Administered 2013-09-15: 2 mg via ORAL
  Filled 2013-09-15: qty 2
  Filled 2013-09-15: qty 1

## 2013-09-15 MED ORDER — ADULT MULTIVITAMIN W/MINERALS CH
1.0000 | ORAL_TABLET | Freq: Every day | ORAL | Status: DC
  Administered 2013-09-15 – 2013-09-16 (×2): 1 via ORAL
  Filled 2013-09-15 (×2): qty 1
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
## 2013-09-15 NOTE — BH Assessment (Signed)
[place] Assessment Progress Note   This clinician went to secure signature on voluntary admission paperwork.  Patient said that he did not want detox but would rather go home.  Clinician asked him to think about it for a few minutes.  Clinician checked the documentation from both Dr. Nicole Cella & TTS clinician Rico Sheehan, both of which cite that the patient had SI and plan.  Clinician brought this up to patient who recanted being suicidal, HI or having A/V hallucinations.  Clinician talked to Patriciaann Clan, [place] who said that patient would need to be cleared psychiatrically by psychiatrist in the AM.  Clinician talked again to pt who was willing to stay in the psych ED tonight to be seen by psychiatrist in the morning.  Nurse [facility] informed.

## 2013-09-15 NOTE — BH Assessment (Signed)
Received call for tele-assessment. Spoke with Mario Creamer, NP who said Pt is reporting that he is suicidal and can't resist being around alcohol. Tele-assessment will be initiated.  Orpah Greek Rosana Hoes, [facility] Triage Specialist

## 2013-09-15 NOTE — Progress Notes (Signed)
Patient has been accepted to [facility] Bed 300-1.  Dr. Sabra Heck is the accepting doctor.  Incoming staff will complete the support paperwork.  The nurse and the ER MD have been notified.

## 2013-09-15 NOTE — ED Notes (Signed)
Belongings moved to locker 38
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
## 2013-09-15 NOTE — ED Notes (Signed)
One red flannel jacket, one pair of silver and blue tennis shoes, discharge papers from previous visit, one pair of jeans, grey underwear, red long sleeved shirt, one beige belt, pair of white socks, pack of pall mall cigarettes, pair of reading glasses, one lighter, fingernail clippers, black hair brush, black watch, black wallet with two debit cards

## 2013-09-15 NOTE — ED Notes (Signed)
Bed: [facility] Expected date:  Expected time:  Means of arrival:  Comments: hold

## 2013-09-15 NOTE — ED Provider Notes (Signed)
Medical screening examination/treatment/procedure(s) were performed by non-physician practitioner and as supervising physician I was immediately available for consultation/collaboration.  EKG Interpretation   None        Varney Biles, MD 09/15/13 [phone]

## 2013-09-15 NOTE — ED Notes (Signed)
11 trazodone sent to pharmacy

## 2013-09-15 NOTE — BH Assessment (Signed)
Assessment complete. Per Okey Regal, [facility], adult unit is at capacity. Consulted with Serena Colonel, NP who recommends Pt be observed in the ED and evaluated by psychiatrist in the morning. Notified Junius Creamer, NP of recommendation.  Orpah Greek Rosana Hoes, [facility] Triage Specialist

## 2013-09-16 NOTE — ED Notes (Signed)
Patient is resting comfortably. 

## 2013-09-16 NOTE — Consult Note (Signed)
  Psychiatric Specialty Exam: Physical Exam  ROS  Blood pressure 104/71, pulse 64, temperature 97.5 F (36.4 C), temperature source Oral, resp. rate 18, SpO2 99.00%.There is no weight on file to calculate BMI.  General Appearance: Casual  Eye Contact::  Good  Speech:  Clear and Coherent  Volume:  Normal  Mood:  Euthymic  Affect:  Appropriate  Thought Process:  Goal Directed  Orientation:  Full (Time, Place, and Person)  Thought Content:  Negative  Suicidal Thoughts:  No  Homicidal Thoughts:  No  Memory:  Immediate;   Good Recent;   Good Remote;   Good  Judgement:  Intact  Insight:  Fair  Psychomotor Activity:  Normal  Concentration:  Good  Recall:  Good  Akathisia:  Negative  Handed:  Right  AIMS (if indicated):     Assets:  Communication Skills  Sleep:   good   Mario Andrews says he has no interest  In rehab or detox.  He is fine today and can be discharged according to his wishes. Diagnosis remains Substance induced mood disorder.

## 2013-09-16 NOTE — BH Assessment (Signed)
Discharge home per Dr. Lovena Le. Patient provided with outpatient referrals to various substance abuse programs including outpatient, residential, CD-IOP, support groups, and individual therapist.

## 2013-09-16 NOTE — ED Notes (Signed)
Patient is resting comfortably. Patient observed resting quietly throughout the night, respirations even; unlabored. Patient safety maintained, Q 15 checks continue.

## 2013-09-17 NOTE — ED Provider Notes (Signed)
Medical screening examination/treatment/procedure(s) were performed by non-physician practitioner and as supervising physician I was immediately available for consultation/collaboration.   Dot Lanes, MD 09/17/13 2136

## 2014-03-02 ENCOUNTER — Emergency Department (HOSPITAL_COMMUNITY)
Admission: EM | Admit: 2014-03-02 | Discharge: 2014-03-03 | Disposition: A | Discharge status: Home / Self Care | Payer: Medicaid Other | Attending: Emergency Medicine | Admitting: Emergency Medicine

## 2014-03-02 ENCOUNTER — Encounter (HOSPITAL_COMMUNITY): Payer: Self-pay | Admitting: Emergency Medicine

## 2014-03-02 DIAGNOSIS — F329 Major depressive disorder, single episode, unspecified: Secondary | ICD-10-CM | POA: Diagnosis not present

## 2014-03-02 DIAGNOSIS — F1410 Cocaine abuse, uncomplicated: Secondary | ICD-10-CM | POA: Diagnosis not present

## 2014-03-02 DIAGNOSIS — Z8673 Personal history of transient ischemic attack (TIA), and cerebral infarction without residual deficits: Secondary | ICD-10-CM | POA: Insufficient documentation

## 2014-03-02 DIAGNOSIS — R7989 Other specified abnormal findings of blood chemistry: Secondary | ICD-10-CM | POA: Diagnosis not present

## 2014-03-02 DIAGNOSIS — J449 Chronic obstructive pulmonary disease, unspecified: Secondary | ICD-10-CM | POA: Insufficient documentation

## 2014-03-02 DIAGNOSIS — F17200 Nicotine dependence, unspecified, uncomplicated: Secondary | ICD-10-CM | POA: Diagnosis not present

## 2014-03-02 DIAGNOSIS — R45851 Suicidal ideations: Secondary | ICD-10-CM | POA: Diagnosis not present

## 2014-03-02 DIAGNOSIS — F3289 Other specified depressive episodes: Secondary | ICD-10-CM | POA: Diagnosis not present

## 2014-03-02 DIAGNOSIS — F1010 Alcohol abuse, uncomplicated: Secondary | ICD-10-CM | POA: Insufficient documentation

## 2014-03-02 DIAGNOSIS — J4489 Other specified chronic obstructive pulmonary disease: Secondary | ICD-10-CM | POA: Insufficient documentation

## 2014-03-02 DIAGNOSIS — Z8669 Personal history of other diseases of the nervous system and sense organs: Secondary | ICD-10-CM | POA: Diagnosis not present

## 2014-03-02 DIAGNOSIS — F10230 Alcohol dependence with withdrawal, uncomplicated: Secondary | ICD-10-CM

## 2014-03-02 LAB — CBC
HEMATOCRIT: 38.5 % — AB (ref 39.0–52.0)
Hemoglobin: 12.9 g/dL — ABNORMAL LOW (ref 13.0–17.0)
MCH: 30.5 pg (ref 26.0–34.0)
MCHC: 33.5 g/dL (ref 30.0–36.0)
MCV: 91 fL (ref 78.0–100.0)
PLATELETS: 147 10*3/uL — AB (ref 150–400)
RBC: 4.23 MIL/uL (ref 4.22–5.81)
RDW: 13.9 % (ref 11.5–15.5)
WBC: 5.6 10*3/uL (ref 4.0–10.5)

## 2014-03-02 LAB — COMPREHENSIVE METABOLIC PANEL
ALBUMIN: 3.3 g/dL — AB (ref 3.5–5.2)
ALT: 104 U/L — ABNORMAL HIGH (ref 0–53)
AST: 185 U/L — AB (ref 0–37)
Alkaline Phosphatase: 151 U/L — ABNORMAL HIGH (ref 39–117)
Anion gap: 15 (ref 5–15)
BUN: 6 mg/dL (ref 6–23)
CALCIUM: 8.8 mg/dL (ref 8.4–10.5)
CO2: 25 meq/L (ref 19–32)
Chloride: 103 mEq/L (ref 96–112)
Creatinine, Ser: 0.94 mg/dL (ref 0.50–1.35)
GFR calc Af Amer: >90 mL/min (ref >90)
Glucose, Bld: 109 mg/dL — ABNORMAL HIGH (ref 70–99)
Potassium: 3.8 mEq/L (ref 3.7–5.3)
Sodium: 143 mEq/L (ref 137–147)
Total Bilirubin: 0.3 mg/dL (ref 0.3–1.2)
Total Protein: 7.2 g/dL (ref 6.0–8.3)

## 2014-03-02 LAB — SALICYLATE LEVEL

## 2014-03-02 LAB — RAPID URINE DRUG SCREEN, HOSP PERFORMED
Amphetamines: NOT DETECTED
Barbiturates: NOT DETECTED
Benzodiazepines: NOT DETECTED
Cocaine: POSITIVE — AB
OPIATES: NOT DETECTED
TETRAHYDROCANNABINOL: NOT DETECTED

## 2014-03-02 LAB — ACETAMINOPHEN LEVEL

## 2014-03-02 LAB — ETHANOL: Alcohol, Ethyl (B): 330 mg/dL — ABNORMAL HIGH (ref 0–11)

## 2014-03-02 MED ORDER — LORAZEPAM 1 MG PO TABS
1.0000 mg | ORAL_TABLET | Freq: Three times a day (TID) | ORAL | Status: DC | PRN
  Administered 2014-03-03: 1 mg via ORAL
  Filled 2014-03-02: qty 1

## 2014-03-02 MED ORDER — ZOLPIDEM TARTRATE 5 MG PO TABS: 5.0000 mg | ORAL_TABLET | Freq: Every evening | ORAL | Status: DC | PRN

## 2014-03-02 MED ORDER — NICOTINE 21 MG/24HR TD PT24
21.0000 mg | MEDICATED_PATCH | Freq: Every day | TRANSDERMAL | Status: DC
  Administered 2014-03-03 (×2): 21 mg via TRANSDERMAL
  Filled 2014-03-02 (×2): qty 1

## 2014-03-02 MED ORDER — IBUPROFEN 200 MG PO TABS: 600.0000 mg | ORAL_TABLET | Freq: Three times a day (TID) | ORAL | Status: DC | PRN

## 2014-03-02 MED ORDER — ALUM & MAG HYDROXIDE-SIMETH 200-200-20 MG/5ML PO SUSP: 30.0000 mL | ORAL | Status: DC | PRN

## 2014-03-02 MED ORDER — ONDANSETRON HCL 4 MG PO TABS: 4.0000 mg | ORAL_TABLET | Freq: Three times a day (TID) | ORAL | Status: DC | PRN

## 2014-03-02 NOTE — ED Notes (Signed)
Per EMS: pt wants detox from ETOH, also states he wants help for behavorial issues but what want say what.

## 2014-03-02 NOTE — ED Provider Notes (Signed)
CSN: [id number]     Arrival date & time 03/02/14  1748 History   This chart was scribed for non-physician provider Michele Mcalpine, PA-C, working with Babette Relic, MD by Irene Pap, ED Scribe. This patient was seen in room [facility] and patient care was started at 6:34 PM.      Chief Complaint  Patient presents with  . ETOH detox    The history is provided by the patient. No language interpreter was used.   HPI Comments: JAIVION KINGSLEY is a 53 y.o. Male with a history of anxiety and depression who presents to the Emergency Department complaining of SI onset earlier today. He states that he was at his hotel room when he felt like he was going to hurt himself. 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 He reports that he used cocaine today and drank 2 quarts of alcohol. He reports that he drinks everyday. He states that SI included cutting his wrists. He reports that the last time he had an alcohol detox was two years ago, where he experienced seizures.    Past Medical History  Diagnosis Date  . Anxiety   . Depression   . COPD (chronic obstructive pulmonary disease)   . Seizures     last seizure 2 months ago  . Stroke     2009  . Alcohol abuse   . Cocaine abuse    Past Surgical History  Procedure Laterality Date  . No past surgeries     No family history on file. History  Substance Use Topics  . Smoking status: Current Every Day Smoker -- 1.00 packs/day for 18 years    Types: Cigarettes  . Smokeless tobacco: Never Used  . Alcohol Use: Yes     Comment: 4 qts/day     Review of Systems  Psychiatric/Behavioral: Positive for suicidal ideas.   A complete 10 system review of systems was obtained and all systems are negative except as noted in the HPI and PMH.     Allergies  Peanut-containing drug products and Tramadol  Home Medications   Prior to Admission medications   Not on File   BP 113/86  Pulse 92  Temp(Src) 98.4 F (36.9 C) (Oral)  Resp 16  SpO2  98% Physical Exam  Nursing note and vitals reviewed. Constitutional: He is oriented to person, place, and time. He appears well-developed and well-nourished. No distress.  HENT:  Head: Normocephalic and atraumatic.  Eyes: Conjunctivae and EOM are normal.  Neck: Normal range of motion. Neck supple.  Cardiovascular: Normal rate, regular rhythm and normal heart sounds.   Pulmonary/Chest: Effort normal and breath sounds normal.  Musculoskeletal: Normal range of motion. He exhibits no edema.  Neurological: He is alert and oriented to person, place, and time.  Skin: Skin is warm and dry.  Psychiatric: His speech is slurred. He exhibits a depressed mood.    ED Course  Procedures (including critical care time) DIAGNOSTIC STUDIES: Oxygen Saturation is 98% on room air, normal by my interpretation.    COORDINATION OF CARE: 6:37 PM-Discussed treatment plan which includes  with pt at bedside and pt agreed to plan.   Labs Review Labs Reviewed  CBC - Abnormal; Notable for the following:    Hemoglobin 12.9 (*)    HCT 38.5 (*)    Platelets 147 (*)    All other components within normal limits  COMPREHENSIVE METABOLIC PANEL - Abnormal; Notable for the following:    Glucose, Bld 109 (*)  Albumin 3.3 (*)    AST 185 (*)    ALT 104 (*)    Alkaline Phosphatase 151 (*)    All other components within normal limits  URINE RAPID DRUG SCREEN (HOSP PERFORMED) - Abnormal; Notable for the following:    Cocaine POSITIVE (*)    All other components within normal limits  ETHANOL - Abnormal; Notable for the following:    Alcohol, Ethyl (B) 330 (*)    All other components within normal limits  SALICYLATE LEVEL - Abnormal; Notable for the following:    Salicylate Lvl <5.6 (*)    All other components within normal limits  ETHANOL - Abnormal; Notable for the following:    Alcohol, Ethyl (B) 163 (*)    All other components within normal limits  ACETAMINOPHEN LEVEL  ETHANOL    Imaging Review No  results found.   EKG Interpretation None      MDM   Final diagnoses:  Alcohol abuse  Cocaine abuse  Suicidal ideation    Patient presenting with alcohol abuse and cocaine abuse requesting help with detox. Also admits to suicidal ideations and plan to cut himself. Vital signs stable. Labs pending. TTS consult.  LFTs elevated, ETOH 330. No abdominal pain or tenderness. LFTs elevated at baseline. Medically cleared.  I personally performed the services described in this documentation, which was scribed in my presence. The recorded information has been reviewed and is accurate.   Illene Labrador, PA-C 03/04/14 [id number]

## 2014-03-03 ENCOUNTER — Observation Stay (HOSPITAL_COMMUNITY): Admission: EM | Admit: 2014-03-03 | Payer: Medicaid Other | Source: Intra-hospital | Admitting: Psychiatry

## 2014-03-03 LAB — ETHANOL: Alcohol, Ethyl (B): 163 mg/dL — ABNORMAL HIGH (ref 0–11)

## 2014-03-03 NOTE — [facility] Suicide Risk Assessment (Signed)
Suicide Risk Assessment  Discharge Assessment     Demographic Factors:  Male and Low socioeconomic status  Total Time spent with patient: 30 minutes  Psychiatric Specialty Exam:     Blood pressure 136/82, pulse 73, temperature 98.1 F (36.7 C), temperature source Oral, resp. rate 12, SpO2 98.00%.There is no weight on file to calculate BMI.  General Appearance: Casual  Eye Contact::  Good  Speech:  Clear and Coherent  Volume:  Normal  Mood:  Depressed  Affect:  Appropriate  Thought Process:  Coherent  Orientation:  Full (Time, Place, and Person)  Thought Content:  Negative  Suicidal Thoughts:  No  Homicidal Thoughts:  No  Memory:  Immediate;   Good Recent;   Good Remote;   Good  Judgement:  Intact  Insight:  Fair  Psychomotor Activity:  Normal  Concentration:  Good  Recall:  Good  Fund of Knowledge:Good  Language: Good  Akathisia:  Negative  Handed:  Right  AIMS (if indicated):     Assets:  Communication Skills Desire for Improvement  Sleep:       Musculoskeletal: Strength & Muscle Tone: within normal limits Gait & Station: normal Patient leans: N/A   Mental Status Per Nursing Assessment::   On Admission:     Current Mental Status by Physician: NA  Loss Factors: NA  Historical Factors: NA  Risk Reduction Factors:   NA  Continued Clinical Symptoms:  Alcohol/Substance Abuse/Dependencies  Cognitive Features That Contribute To Risk:  Closed-mindedness    Suicide Risk:  Minimal: No identifiable suicidal ideation.  Patients presenting with no risk factors but with morbid ruminations; may be classified as minimal risk based on the severity of the depressive symptoms  Discharge Diagnoses:   AXIS I:  alcohol dependence AXIS II:  Deferred AXIS III:   Past Medical History  Diagnosis Date  . Anxiety   . Depression   . COPD (chronic obstructive pulmonary disease)   . Seizures     last seizure 2 months ago  . Stroke     2009  . Alcohol abuse    . Cocaine abuse    AXIS IV:  addiction AXIS V:  51-60 moderate symptoms  Plan Of Care/Follow-up recommendations:  Activity:  resume usual activity Diet:  resume usual diet  Is patient on multiple antipsychotic therapies at discharge:  No   Has Patient had three or more failed trials of antipsychotic monotherapy by history:  No  Recommended Plan for Multiple Antipsychotic Therapies: NA    TAYLOR,GERALD D 03/03/2014, 1:58 PM

## 2014-03-03 NOTE — Progress Notes (Signed)
Pt was given "Relapse" packet. Pt asked about the packet but did not appear interested.

## 2014-03-03 NOTE — ED Provider Notes (Signed)
Medical screening examination/treatment/procedure(s) were performed by non-physician practitioner and as supervising physician I was immediately available for consultation/collaboration.   EKG Interpretation None       Babette Relic, MD 03/03/14 1407

## 2014-03-03 NOTE — ED Notes (Signed)
Discharged to [facility] in custody of [place] driver.

## 2014-03-03 NOTE — BH Assessment (Signed)
Assessment Note  Mario Andrews is an 53 y.o. male.  -Pt initially seen by Michele Mcalpine, PA and patient was requesting detox.  At the time the patient was inebriated and was talking about cutting his wrists to die.    Patient still wants to get detox from ETOH.  Pt reports drinking 3 quarts of malt liquor every other day.  Last use was prior to arrival on 07/06.  Patient has been drinking at this rate for the last 6 months.  Patient was surprised that he tested positive for cocaine.  When asked, he said that this was the first use in a month and he only "did enough to turn up dirty."    Patient is currently on probation and will soon be off it he said.  Patient denies HI and A/V hallucinations at this time.  Patient had previously endorsed suicide but at this time said that he wants help for detox.  Pt not currently suicidal, with no plan or intent.  -Pt care discussed with Patriciaann Clan, PA who recommended inpatient detox at [facility]. Clinician informed Dr. Florina Ou and he agreed with patient needing detox.  [facility], [facility] there said they had one male bed available.  Axis I: Substance Induced Mood Disorder and 303.90 ETOH use d/o severe Axis II: Deferred Axis III:  Past Medical History  Diagnosis Date  . Anxiety   . Depression   . COPD (chronic obstructive pulmonary disease)   . Seizures     last seizure 2 months ago  . Stroke     2009  . Alcohol abuse   . Cocaine abuse    Axis IV: economic problems, housing problems, occupational problems, problems related to legal system/crime and problems with primary support group Axis V: 31-40 impairment in reality testing  Past Medical History:  Past Medical History  Diagnosis Date  . Anxiety   . Depression   . COPD (chronic obstructive pulmonary disease)   . Seizures     last seizure 2 months ago  . Stroke     2009  . Alcohol abuse   . Cocaine abuse     Past Surgical History  Procedure Laterality Date  . No  past surgeries      Family History: No family history on file.  Social History:  reports that he has been smoking Cigarettes.  He has a 18 pack-year smoking history. He has never used smokeless tobacco. He reports that he drinks alcohol. He reports that he uses illicit drugs ("Crack" cocaine and Cocaine).  Additional Social History:  Alcohol / Drug Use Pain Medications: N/A Prescriptions: Reports no medications Over the Counter: N/A History of alcohol / drug use?: Yes Longest period of sobriety (when/how long): One month Negative Consequences of Use: Personal relationships;Legal Withdrawal Symptoms: Cramps;Diarrhea;Fever / Chills;Nausea / Vomiting;Patient aware of relationship between substance abuse and physical/medical complications;Sweats;Tingling;Weakness Substance #1 Name of Substance 1: ETOH.  Primarily malt liquor 1 - Age of First Use: 53 years of age 76 - Amount (size/oz): Drinks 3 quarts of malt liquor 1 - Frequency: Every other day 1 - Duration: Last 6 months at that rate 1 - Last Use / Amount: 07/06 drank 3 quarts Substance #2 Name of Substance 2: Cocaine 2 - Age of First Use: 31 "something" 2 - Amount (size/oz): Pt does not know 2 - Frequency: Michela Pitcher it was the first time in a month 2 - Duration: One time in the past month 2 -  Last Use / Amount: 07/05  CIWA: CIWA-Ar BP: 110/68 mmHg Pulse Rate: 76 COWS:    Allergies:  Allergies  Allergen Reactions  . Peanut-Containing Drug Products Hives  . Tramadol Nausea Only    "upset stomach"    Home Medications:  (Not in a hospital admission)  OB/GYN Status:  No LMP for male patient.  General Assessment Data Location of Assessment: [facility] ED Is this a Tele or Face-to-Face Assessment?: Face-to-Face Is this an Initial Assessment or a Re-assessment for this encounter?: Initial Assessment Living Arrangements: Other (Comment) (Pt homeless. Living out of a hotel room.) Can pt return to current living arrangement?:  Yes Admission Status: Voluntary Is patient capable of signing voluntary admission?: Yes Transfer from: [facility] Referral Source: Self/Family/Friend     [place] Living Arrangements: Other (Comment) (Pt homeless. Living out of a hotel room.) Name of Psychiatrist: N/A Name of Therapist: N/A     Risk to self Suicidal Ideation: No Suicidal Intent: No Is patient at risk for suicide?: No Suicidal Plan?: No Access to Means: No What has been your use of drugs/alcohol within the last 12 months?: ETOH & cocaine Previous Attempts/Gestures: No How many times?: 0 Other Self Harm Risks: SA issues Triggers for Past Attempts: None known Intentional Self Injurious Behavior: None Family Suicide History: No Recent stressful life event(s): Legal Issues (On probation & homeless) Persecutory voices/beliefs?: No Depression: Yes Depression Symptoms: Despondent;Isolating;Guilt;Loss of interest in usual pleasures Substance abuse history and/or treatment for substance abuse?: Yes Suicide prevention information given to non-admitted patients: Not applicable  Risk to Others Homicidal Ideation: No Thoughts of Harm to Others: No Current Homicidal Intent: No Current Homicidal Plan: No Access to Homicidal Means: No Identified Victim: No one History of harm to others?: No Assessment of Violence: None Noted Violent Behavior Description: None noted Does patient have access to weapons?: No Criminal Charges Pending?: No (On probation for B&E.  Soon to be complete on probation.) Does patient have a court date: No  Psychosis Hallucinations: None noted Delusions: None noted  Mental Status Report Appear/Hygiene: Disheveled;Poor hygiene Eye Contact: Good Motor Activity: Freedom of movement Speech: Logical/coherent Level of Consciousness: Quiet/awake Mood: Depressed Affect: Sad Anxiety Level: Severe Thought Processes: Coherent;Relevant Judgement: Unimpaired Orientation:  Person;Place;Time;Situation Obsessive Compulsive Thoughts/Behaviors: None  Cognitive Functioning Concentration: Decreased Memory: Recent Impaired;Remote Intact IQ: Average Insight: Fair Impulse Control: Poor Appetite: Fair Weight Loss: 0 Weight Gain: 0 Sleep: Decreased Total Hours of Sleep:  (<6H/D) Vegetative Symptoms: None  ADLScreening [facility] Assessment Services) Patient's cognitive ability adequate to safely complete daily activities?: Yes Patient able to express need for assistance with ADLs?: Yes Independently performs ADLs?: Yes (appropriate for developmental age)  Prior Inpatient Therapy Prior Inpatient Therapy: Yes Prior Therapy Dates: Jan '15, July & June '13 Prior Therapy Facilty/Provider(s): [facility] Reason for Treatment: Detox  Prior Outpatient Therapy Prior Outpatient Therapy: No Prior Therapy Dates: N/A Prior Therapy Facilty/Provider(s): N/A Reason for Treatment: N/A  ADL Screening (condition at time of admission) Patient's cognitive ability adequate to safely complete daily activities?: Yes Is the patient deaf or have difficulty hearing?: No Does the patient have difficulty seeing, even when wearing glasses/contacts?: No (Pt says he uses eyeglasses) Does the patient have difficulty concentrating, remembering, or making decisions?: No Patient able to express need for assistance with ADLs?: Yes Does the patient have difficulty dressing or bathing?: No Independently performs ADLs?: Yes (appropriate for developmental age) Does the patient have difficulty walking or climbing stairs?: No Weakness of Legs: None Weakness  of Arms/Hands: None       Abuse/Neglect Assessment (Assessment to be complete while patient is alone) Physical Abuse: Denies Verbal Abuse: Denies Sexual Abuse: Denies Exploitation of patient/patient's resources: Denies Self-Neglect: Denies Values / Beliefs Cultural Requests During Hospitalization: None Spiritual Requests During  Hospitalization: None   Advance Directives (For Healthcare) Advance Directive: Patient does not have advance directive;Patient would not like information Pre-existing out of facility DNR order (yellow form or pink MOST form): No    Additional Information 1:1 In Past 12 Months?: No CIRT Risk: No Elopement Risk: No Does patient have medical clearance?: Yes     Disposition:  Disposition Initial Assessment Completed for this Encounter: Yes Disposition of Patient: Inpatient treatment program;Referred to Type of inpatient treatment program: Adult Patient referred to: [facility]  On Site Evaluation by:   Reviewed with Physician:    Raymondo Band 03/03/2014 6:57 AM

## 2014-03-03 NOTE — Consult Note (Signed)
[facility] Face-to-Face Psychiatry Consult   Reason for Consult:  Requesting detox from alcohol Referring Physician:  ER MD  Mario Andrews is an 53 y.o. male. Total Time spent with patient: 30 minutes  Assessment: AXIS I:  alcohol dependence AXIS II:  Deferred AXIS III:   Past Medical History  Diagnosis Date  . Anxiety   . Depression   . COPD (chronic obstructive pulmonary disease)   . Seizures     last seizure 2 months ago  . Stroke     2009  . Alcohol abuse   . Cocaine abuse    AXIS IV:  addiction AXIS V:  51-60 moderate symptoms  Plan:  No evidence of imminent risk to self or others at present.    Subjective:   Mario Andrews is a 53 y.o. male patient admitted with requesting detox.  HPI:  Mario Andrews says he drinks 2 quarts of alcohol daily with his last drink yesterday.  He has been to detox in the past on one occasion.  He denies any suicidal ideation.  He cannot stop on his own he says. HPI Elements:   Location:  alcohol dependence. Quality:  2 quarts of alcohol daily. Severity:  as above. Timing:  continues to drink and says he has had enough. Duration:  years. Context:  as above.  Past Psychiatric History: Past Medical History  Diagnosis Date  . Anxiety   . Depression   . COPD (chronic obstructive pulmonary disease)   . Seizures     last seizure 2 months ago  . Stroke     2009  . Alcohol abuse   . Cocaine abuse     reports that he has been smoking Cigarettes.  He has a 18 pack-year smoking history. He has never used smokeless tobacco. He reports that he drinks alcohol. He reports that he uses illicit drugs ("Crack" cocaine and Cocaine). No family history on file. Family History Substance Abuse: No Family Supports: No Living Arrangements: Other (Comment) (Pt homeless. Living out of a hotel room.) Can pt return to current living arrangement?: Yes Abuse/Neglect [facility]) Physical Abuse: Denies Verbal Abuse: Denies Sexual Abuse: Denies Allergies:   Allergies   Allergen Reactions  . Peanut-Containing Drug Products Hives  . Tramadol Nausea Only    "upset stomach"    ACT Assessment Complete:  Yes:    Educational Status    Risk to Self: Risk to self Suicidal Ideation: No Suicidal Intent: No Is patient at risk for suicide?: No Suicidal Plan?: No Access to Means: No What has been your use of drugs/alcohol within the last 12 months?: ETOH & cocaine Previous Attempts/Gestures: No How many times?: 0 Other Self Harm Risks: SA issues Triggers for Past Attempts: None known Intentional Self Injurious Behavior: None Family Suicide History: No Recent stressful life event(s): Legal Issues (On probation & homeless) Persecutory voices/beliefs?: No Depression: Yes Depression Symptoms: Despondent;Isolating;Guilt;Loss of interest in usual pleasures Substance abuse history and/or treatment for substance abuse?: Yes Suicide prevention information given to non-admitted patients: Not applicable  Risk to Others: Risk to Others Homicidal Ideation: No Thoughts of Harm to Others: No Current Homicidal Intent: No Current Homicidal Plan: No Access to Homicidal Means: No Identified Victim: No one History of harm to others?: No Assessment of Violence: None Noted Violent Behavior Description: None noted Does patient have access to weapons?: No Criminal Charges Pending?: No (On probation for B&E.  Soon to be complete on probation.) Does patient have a court date: No  Abuse:  Abuse/Neglect Assessment (Assessment to be complete while patient is alone) Physical Abuse: Denies Verbal Abuse: Denies Sexual Abuse: Denies Exploitation of patient/patient's resources: Denies Self-Neglect: Denies  Prior Inpatient Therapy: Prior Inpatient Therapy Prior Inpatient Therapy: Yes Prior Therapy Dates: Jan '15, July & June '13 Prior Therapy Facilty/Provider(s): [facility] Reason for Treatment: Detox  Prior Outpatient Therapy: Prior Outpatient Therapy Prior Outpatient Therapy:  No Prior Therapy Dates: N/A Prior Therapy Facilty/Provider(s): N/A Reason for Treatment: N/A  Additional Information: Additional Information 1:1 In Past 12 Months?: No CIRT Risk: No Elopement Risk: No Does patient have medical clearance?: Yes                  Objective: Blood pressure 136/82, pulse 73, temperature 98.1 F (36.7 C), temperature source Oral, resp. rate 12, SpO2 98.00%.There is no weight on file to calculate BMI. Results for orders placed during the hospital encounter of 03/02/14 (from the past 72 hour(s))  CBC     Status: Abnormal   Collection Time    03/02/14  6:45 PM      Result Value Ref Range   WBC 5.6  4.0 - 10.5 K/uL   RBC 4.23  4.22 - 5.81 MIL/uL   Hemoglobin 12.9 (*) 13.0 - 17.0 g/dL   HCT 38.5 (*) 39.0 - 52.0 %   MCV 91.0  78.0 - 100.0 fL   MCH 30.5  26.0 - 34.0 pg   MCHC 33.5  30.0 - 36.0 g/dL   RDW 13.9  11.5 - 15.5 %   Platelets 147 (*) 150 - 400 K/uL  COMPREHENSIVE METABOLIC PANEL     Status: Abnormal   Collection Time    03/02/14  6:45 PM      Result Value Ref Range   Sodium 143  137 - 147 mEq/L   Potassium 3.8  3.7 - 5.3 mEq/L   Chloride 103  96 - 112 mEq/L   CO2 25  19 - 32 mEq/L   Glucose, Bld 109 (*) 70 - 99 mg/dL   BUN 6  6 - 23 mg/dL   Creatinine, Ser 0.94  0.50 - 1.35 mg/dL   Calcium 8.8  8.4 - 10.5 mg/dL   Total Protein 7.2  6.0 - 8.3 g/dL   Albumin 3.3 (*) 3.5 - 5.2 g/dL   AST 185 (*) 0 - 37 U/L   ALT 104 (*) 0 - 53 U/L   Alkaline Phosphatase 151 (*) 39 - 117 U/L   Total Bilirubin 0.3  0.3 - 1.2 mg/dL   GFR calc non Af Amer >90  >90 mL/min   GFR calc Af Amer >90  >90 mL/min   Comment: (NOTE)     The eGFR has been calculated using the CKD EPI equation.     This calculation has not been validated in all clinical situations.     eGFR's persistently <90 mL/min signify possible Chronic Kidney     Disease.   Anion gap 15  5 - 15  ETHANOL     Status: Abnormal   Collection Time    03/02/14  6:45 PM      Result Value  Ref Range   Alcohol, Ethyl (B) 330 (*) 0 - 11 mg/dL   Comment:            LOWEST DETECTABLE LIMIT FOR     SERUM ALCOHOL IS 11 mg/dL     FOR MEDICAL PURPOSES ONLY  SALICYLATE LEVEL     Status: Abnormal   Collection Time  03/02/14  6:45 PM      Result Value Ref Range   Salicylate Lvl <6.3 (*) 2.8 - 20.0 mg/dL  ACETAMINOPHEN LEVEL     Status: None   Collection Time    03/02/14  6:45 PM      Result Value Ref Range   Acetaminophen (Tylenol), Serum <15.0  10 - 30 ug/mL   Comment:            THERAPEUTIC CONCENTRATIONS VARY     SIGNIFICANTLY. A RANGE OF 10-30     ug/mL MAY BE AN EFFECTIVE     CONCENTRATION FOR MANY PATIENTS.     HOWEVER, SOME ARE BEST TREATED     AT CONCENTRATIONS OUTSIDE THIS     RANGE.     ACETAMINOPHEN CONCENTRATIONS     >150 ug/mL AT 4 HOURS AFTER     INGESTION AND >50 ug/mL AT 12     HOURS AFTER INGESTION ARE     OFTEN ASSOCIATED WITH TOXIC     REACTIONS.  URINE RAPID DRUG SCREEN (HOSP PERFORMED)     Status: Abnormal   Collection Time    03/02/14  8:22 PM      Result Value Ref Range   Opiates NONE DETECTED  NONE DETECTED   Cocaine POSITIVE (*) NONE DETECTED   Benzodiazepines NONE DETECTED  NONE DETECTED   Amphetamines NONE DETECTED  NONE DETECTED   Tetrahydrocannabinol NONE DETECTED  NONE DETECTED   Barbiturates NONE DETECTED  NONE DETECTED   Comment:            DRUG SCREEN FOR MEDICAL PURPOSES     ONLY.  IF CONFIRMATION IS NEEDED     FOR ANY PURPOSE, NOTIFY LAB     WITHIN 5 DAYS.                LOWEST DETECTABLE LIMITS     FOR URINE DRUG SCREEN     Drug Class       Cutoff (ng/mL)     Amphetamine      1000     Barbiturate      200     Benzodiazepine   149     Tricyclics       702     Opiates          300     Cocaine          300     THC              50  ETHANOL     Status: Abnormal   Collection Time    03/03/14  2:37 AM      Result Value Ref Range   Alcohol, Ethyl (B) 163 (*) 0 - 11 mg/dL   Comment:            LOWEST DETECTABLE LIMIT FOR      SERUM ALCOHOL IS 11 mg/dL     FOR MEDICAL PURPOSES ONLY   Labs are reviewed and are pertinent for alcohol and cocaine.  Current Facility-Administered Medications  Medication Dose Route Frequency Provider Last Rate Last Dose  . alum & mag hydroxide-simeth (MAALOX/MYLANTA) 200-200-20 MG/5ML suspension 30 mL  30 mL Oral PRN Illene Labrador, PA-C      . ibuprofen (ADVIL,MOTRIN) tablet 600 mg  600 mg Oral Q8H PRN Illene Labrador, PA-C      . LORazepam (ATIVAN) tablet 1 mg  1 mg Oral Q8H PRN Illene Labrador, PA-C   1 mg at  03/03/14 0826  . nicotine (NICODERM CQ - dosed in mg/24 hours) patch 21 mg  21 mg Transdermal Daily Illene Labrador, PA-C   21 mg at 03/03/14 1036  . ondansetron (ZOFRAN) tablet 4 mg  4 mg Oral Q8H PRN Illene Labrador, PA-C      . zolpidem [facility]) tablet 5 mg  5 mg Oral QHS PRN Illene Labrador, PA-C       Current Outpatient Prescriptions  Medication Sig Dispense Refill  . [DISCONTINUED] famotidine (PEPCID) 20 MG tablet Take 1 tablet (20 mg total) by mouth 2 (two) times daily.  30 tablet  0    Psychiatric Specialty Exam:     Blood pressure 136/82, pulse 73, temperature 98.1 F (36.7 C), temperature source Oral, resp. rate 12, SpO2 98.00%.There is no weight on file to calculate BMI.  General Appearance: Casual  Eye Contact::  Good  Speech:  Clear and Coherent  Volume:  Normal  Mood:  Anxious  Affect:  Appropriate  Thought Process:  Coherent  Orientation:  Full (Time, Place, and Person)  Thought Content:  Negative  Suicidal Thoughts:  No  Homicidal Thoughts:  No  Memory:  Immediate;   Good Recent;   Good Remote;   Good  Judgement:  Fair  Insight:  Fair  Psychomotor Activity:  Normal  Concentration:  Good  Recall:  Good  Fund of Knowledge:Good  Language: Good  Akathisia:  Negative  Handed:  Right  AIMS (if indicated):     Assets:  Communication Skills Desire for Improvement Financial Resources/Insurance  Sleep:      Musculoskeletal: Strength & Muscle  Tone: within normal limits Gait & Station: normal Patient leans: N/A  Treatment Plan Summary: He has been accepoted to [facility] for detox leaving today  TAYLOR,GERALD D 03/03/2014 1:45 PM

## 2014-03-03 NOTE — Consult Note (Signed)
  Review of Systems  Constitutional: Negative.   HENT: Negative.   Eyes: Negative.   Respiratory: Negative.   Cardiovascular: Negative.   Gastrointestinal: Negative.   Musculoskeletal: Negative.   Skin: Negative.   Neurological: Negative.   Endo/Heme/Allergies: Negative.   Psychiatric/Behavioral: Positive for substance abuse.

## 2014-04-20 ENCOUNTER — Emergency Department (HOSPITAL_COMMUNITY)
Admission: EM | Admit: 2014-04-20 | Discharge: 2014-04-21 | Discharge status: Against Medical Advice | Payer: Medicaid Other | Attending: Emergency Medicine | Admitting: Emergency Medicine

## 2014-04-20 ENCOUNTER — Encounter (HOSPITAL_COMMUNITY): Payer: Self-pay | Admitting: Emergency Medicine

## 2014-04-20 DIAGNOSIS — R1110 Vomiting, unspecified: Secondary | ICD-10-CM | POA: Diagnosis present

## 2014-04-20 DIAGNOSIS — F17200 Nicotine dependence, unspecified, uncomplicated: Secondary | ICD-10-CM | POA: Diagnosis not present

## 2014-04-20 DIAGNOSIS — Z8673 Personal history of transient ischemic attack (TIA), and cerebral infarction without residual deficits: Secondary | ICD-10-CM | POA: Diagnosis not present

## 2014-04-20 DIAGNOSIS — R42 Dizziness and giddiness: Secondary | ICD-10-CM | POA: Diagnosis not present

## 2014-04-20 DIAGNOSIS — J449 Chronic obstructive pulmonary disease, unspecified: Secondary | ICD-10-CM | POA: Insufficient documentation

## 2014-04-20 DIAGNOSIS — J4489 Other specified chronic obstructive pulmonary disease: Secondary | ICD-10-CM | POA: Insufficient documentation

## 2014-04-20 NOTE — ED Notes (Signed)
Called to take a pt to treatment room  No response from lobby

## 2014-04-20 NOTE — ED Notes (Signed)
Called for second time without response from lobby

## 2014-04-20 NOTE — ED Notes (Signed)
Pt states yesterday he was sitting in the kitchen drinking water and felt bad  Pt states shortly after he started having vomiting  Pt states he has continued to vomit today  Pt states last time was this morning  Pt states he has felt tired and when he lays down and then goes to get up he gets dizzy like he is going to pass out  Pt states he has been feeling short of breath

## 2014-05-15 ENCOUNTER — Inpatient Hospital Stay (HOSPITAL_COMMUNITY)
Admission: EM | Admit: 2014-05-15 | Discharge: 2014-05-16 | DRG: 918 | Disposition: A | Discharge status: Home / Self Care | Payer: Medicaid Other | Attending: Internal Medicine | Admitting: Internal Medicine

## 2014-05-15 ENCOUNTER — Encounter (HOSPITAL_COMMUNITY): Payer: Self-pay | Admitting: Emergency Medicine

## 2014-05-15 DIAGNOSIS — F411 Generalized anxiety disorder: Secondary | ICD-10-CM

## 2014-05-15 DIAGNOSIS — J449 Chronic obstructive pulmonary disease, unspecified: Secondary | ICD-10-CM | POA: Diagnosis present

## 2014-05-15 DIAGNOSIS — F1020 Alcohol dependence, uncomplicated: Secondary | ICD-10-CM

## 2014-05-15 DIAGNOSIS — Z8673 Personal history of transient ischemic attack (TIA), and cerebral infarction without residual deficits: Secondary | ICD-10-CM | POA: Diagnosis not present

## 2014-05-15 DIAGNOSIS — F1410 Cocaine abuse, uncomplicated: Secondary | ICD-10-CM | POA: Diagnosis present

## 2014-05-15 DIAGNOSIS — T6391XA Toxic effect of contact with unspecified venomous animal, accidental (unintentional), initial encounter: Principal | ICD-10-CM | POA: Diagnosis present

## 2014-05-15 DIAGNOSIS — F17200 Nicotine dependence, unspecified, uncomplicated: Secondary | ICD-10-CM | POA: Diagnosis present

## 2014-05-15 DIAGNOSIS — T63001A Toxic effect of unspecified snake venom, accidental (unintentional), initial encounter: Secondary | ICD-10-CM | POA: Diagnosis present

## 2014-05-15 DIAGNOSIS — J4489 Other specified chronic obstructive pulmonary disease: Secondary | ICD-10-CM | POA: Diagnosis present

## 2014-05-15 DIAGNOSIS — F1010 Alcohol abuse, uncomplicated: Secondary | ICD-10-CM

## 2014-05-15 DIAGNOSIS — T63121A Toxic effect of venom of other venomous lizard, accidental (unintentional), initial encounter: Secondary | ICD-10-CM | POA: Diagnosis present

## 2014-05-15 DIAGNOSIS — W5911XA Bitten by nonvenomous snake, initial encounter: Secondary | ICD-10-CM | POA: Diagnosis present

## 2014-05-15 LAB — CBC WITH DIFFERENTIAL/PLATELET
BASOS ABS: 0 10*3/uL (ref 0.0–0.1)
BASOS PCT: 1 % (ref 0–1)
Basophils Absolute: 0 10*3/uL (ref 0.0–0.1)
Basophils Relative: 1 % (ref 0–1)
EOS ABS: 0.1 10*3/uL (ref 0.0–0.7)
EOS PCT: 2 % (ref 0–5)
Eosinophils Absolute: 0 10*3/uL (ref 0.0–0.7)
Eosinophils Relative: 1 % (ref 0–5)
HCT: 37.5 % — ABNORMAL LOW (ref 39.0–52.0)
HEMATOCRIT: 42.1 % (ref 39.0–52.0)
Hemoglobin: 12.4 g/dL — ABNORMAL LOW (ref 13.0–17.0)
Hemoglobin: 12.4 g/dL — ABNORMAL LOW (ref 13.0–17.0)
Lymphocytes Relative: 42 % (ref 12–46)
Lymphocytes Relative: 27 % (ref 12–46)
Lymphs Abs: 2.4 10*3/uL (ref 0.7–4.0)
Lymphs Abs: 1.8 10*3/uL (ref 0.7–4.0)
MCH: 31.4 pg (ref 26.0–34.0)
MCH: 30.9 pg (ref 26.0–34.0)
MCHC: 29.5 g/dL — ABNORMAL LOW (ref 30.0–36.0)
MCHC: 33.1 g/dL (ref 30.0–36.0)
MCV: 106.6 fL — ABNORMAL HIGH (ref 78.0–100.0)
MCV: 93.5 fL (ref 78.0–100.0)
MONO ABS: 0.6 10*3/uL (ref 0.1–1.0)
Monocytes Absolute: 0.5 10*3/uL (ref 0.1–1.0)
Monocytes Relative: 10 % (ref 3–12)
Monocytes Relative: 7 % (ref 3–12)
Neutro Abs: 2.6 10*3/uL (ref 1.7–7.7)
Neutro Abs: 4.4 10*3/uL (ref 1.7–7.7)
Neutrophils Relative %: 45 % (ref 43–77)
Neutrophils Relative %: 66 % (ref 43–77)
PLATELETS: 151 10*3/uL (ref 150–400)
Platelets: 148 10*3/uL — ABNORMAL LOW (ref 150–400)
RBC: 3.95 MIL/uL — ABNORMAL LOW (ref 4.22–5.81)
RBC: 4.01 MIL/uL — ABNORMAL LOW (ref 4.22–5.81)
RDW: 15.8 % — ABNORMAL HIGH (ref 11.5–15.5)
RDW: 15.2 % (ref 11.5–15.5)
WBC: 5.6 10*3/uL (ref 4.0–10.5)
WBC: 6.7 10*3/uL (ref 4.0–10.5)

## 2014-05-15 LAB — ETHANOL: Alcohol, Ethyl (B): 123 mg/dL — ABNORMAL HIGH (ref 0–11)

## 2014-05-15 LAB — BASIC METABOLIC PANEL
Anion gap: 15 (ref 5–15)
BUN: 10 mg/dL (ref 6–23)
CALCIUM: 8.3 mg/dL — AB (ref 8.4–10.5)
CO2: 23 meq/L (ref 19–32)
CREATININE: 0.75 mg/dL (ref 0.50–1.35)
Chloride: 100 mEq/L (ref 96–112)
GFR calc Af Amer: >90 mL/min (ref >90)
GFR calc non Af Amer: >90 mL/min (ref >90)
GLUCOSE: 105 mg/dL — AB (ref 70–99)
Potassium: 3.9 mEq/L (ref 3.7–5.3)
Sodium: 138 mEq/L (ref 137–147)

## 2014-05-15 LAB — RAPID URINE DRUG SCREEN, HOSP PERFORMED
AMPHETAMINES: NOT DETECTED
BARBITURATES: NOT DETECTED
Benzodiazepines: NOT DETECTED
Cocaine: POSITIVE — AB
Opiates: NOT DETECTED
Tetrahydrocannabinol: NOT DETECTED

## 2014-05-15 LAB — FIBRINOGEN
FIBRINOGEN: 207 mg/dL (ref 204–475)
Fibrinogen: 209 mg/dL (ref 204–475)

## 2014-05-15 LAB — PROTIME-INR
INR: 1.08 (ref 0.00–1.49)
INR: 1.11 (ref 0.00–1.49)
PROTHROMBIN TIME: 14.3 s (ref 11.6–15.2)
Prothrombin Time: 14 seconds (ref 11.6–15.2)

## 2014-05-15 LAB — MAGNESIUM: Magnesium: 1.8 mg/dL (ref 1.5–2.5)

## 2014-05-15 LAB — APTT: APTT: 34 s (ref 24–37)

## 2014-05-15 LAB — TSH: TSH: 5.25 u[IU]/mL — AB (ref 0.350–4.500)

## 2014-05-15 LAB — PHOSPHORUS: Phosphorus: 3.4 mg/dL (ref 2.3–4.6)

## 2014-05-15 MED ORDER — TETANUS-DIPHTH-ACELL PERTUSSIS 5-2.5-18.5 LF-MCG/0.5 IM SUSP
0.5000 mL | Freq: Once | INTRAMUSCULAR | Status: DC
  Filled 2014-05-15: qty 0.5

## 2014-05-15 MED ORDER — SODIUM CHLORIDE 0.9 % IV SOLN
INTRAVENOUS | Status: DC
Start: 2014-05-15 — End: 2014-05-15

## 2014-05-15 MED ORDER — HYDROMORPHONE 0.3 MG/ML IV SOLN
INTRAVENOUS | Status: DC
  Administered 2014-05-15: 1 mg via INTRAVENOUS
  Administered 2014-05-15: 13.33 mg via INTRAVENOUS
  Administered 2014-05-15: 3.33 mg via INTRAVENOUS
  Administered 2014-05-16: 03:00:00 via INTRAVENOUS
  Filled 2014-05-15 (×2): qty 25

## 2014-05-15 MED ORDER — NALOXONE HCL 0.4 MG/ML IJ SOLN: 0.4000 mg | INTRAMUSCULAR | Status: DC | PRN

## 2014-05-15 MED ORDER — DIPHENHYDRAMINE HCL 50 MG/ML IJ SOLN: 12.5000 mg | Freq: Four times a day (QID) | INTRAMUSCULAR | Status: DC | PRN

## 2014-05-15 MED ORDER — SODIUM CHLORIDE 0.9 % IV SOLN
4.0000 | Freq: Once | INTRAVENOUS | Status: AC
  Administered 2014-05-15: 72 mL via INTRAVENOUS
  Filled 2014-05-15: qty 72

## 2014-05-15 MED ORDER — ACETAMINOPHEN 325 MG PO TABS
650.0000 mg | ORAL_TABLET | Freq: Four times a day (QID) | ORAL | Status: DC | PRN
Start: 2014-05-15 — End: 2014-05-16

## 2014-05-15 MED ORDER — DIPHENHYDRAMINE HCL 12.5 MG/5ML PO ELIX: 12.5000 mg | ORAL_SOLUTION | Freq: Four times a day (QID) | ORAL | Status: DC | PRN

## 2014-05-15 MED ORDER — HYDROMORPHONE HCL 1 MG/ML IJ SOLN: 1.0000 mg | Freq: Once | INTRAMUSCULAR | Status: DC

## 2014-05-15 MED ORDER — SODIUM CHLORIDE 0.9 % IJ SOLN: 9.0000 mL | INTRAMUSCULAR | Status: DC | PRN

## 2014-05-15 MED ORDER — SODIUM CHLORIDE 0.9 % IV SOLN
INTRAVENOUS | Status: DC
  Administered 2014-05-16: 07:00:00 via INTRAVENOUS

## 2014-05-15 MED ORDER — ONDANSETRON HCL 4 MG PO TABS: 4.0000 mg | ORAL_TABLET | Freq: Four times a day (QID) | ORAL | Status: DC | PRN

## 2014-05-15 MED ORDER — HYDROMORPHONE HCL 1 MG/ML IJ SOLN
1.0000 mg | Freq: Once | INTRAMUSCULAR | Status: AC
  Administered 2014-05-15: 1 mg via INTRAVENOUS
  Filled 2014-05-15: qty 1

## 2014-05-15 MED ORDER — INFLUENZA VAC SPLIT QUAD 0.5 ML IM SUSY
0.5000 mL | PREFILLED_SYRINGE | INTRAMUSCULAR | Status: DC
  Filled 2014-05-15 (×2): qty 0.5

## 2014-05-15 MED ORDER — SODIUM CHLORIDE 0.9 % IV SOLN
1000.0000 mL | Freq: Once | INTRAVENOUS | Status: AC
  Administered 2014-05-15: 1000 mL via INTRAVENOUS

## 2014-05-15 MED ORDER — ACETAMINOPHEN 650 MG RE SUPP: 650.0000 mg | Freq: Four times a day (QID) | RECTAL | Status: DC | PRN

## 2014-05-15 MED ORDER — ONDANSETRON HCL 4 MG/2ML IJ SOLN
4.0000 mg | Freq: Four times a day (QID) | INTRAMUSCULAR | Status: DC | PRN
  Administered 2014-05-15: 4 mg via INTRAVENOUS

## 2014-05-15 MED ORDER — ONDANSETRON HCL 4 MG/2ML IJ SOLN
4.0000 mg | Freq: Four times a day (QID) | INTRAMUSCULAR | Status: DC | PRN
  Filled 2014-05-15: qty 2

## 2014-05-15 MED ORDER — HYDROMORPHONE HCL 2 MG/ML IJ SOLN
2.0000 mg | Freq: Once | INTRAMUSCULAR | Status: AC
  Administered 2014-05-15: 2 mg via INTRAVENOUS
  Filled 2014-05-15: qty 1

## 2014-05-15 NOTE — ED Provider Notes (Signed)
CSN: [id number]     Arrival date & time 05/15/14  1241 History   First MD Initiated Contact with Patient 05/15/14 1308     Chief Complaint  Patient presents with  . Snake Bite     (Consider location/radiation/quality/duration/timing/severity/associated sxs/prior Treatment) HPI  Mario Andrews is a 53 y.o. male presents for evaluation of pain and swelling after a snake bite. He was bit by a snake that he is concerned is poisonous. EMS was able to get a picture of the snake, which had been killed, which appears to be a roughly 3 foot long copperhead. He was bit on the left long finger. He denies chest pain, shortness of breath, weakness, or dizziness. He has severe pain in the left hand. He's never had this happen previously. There were no preceding illnesses. There are no other known modifying factors.  Past Medical History  Diagnosis Date  . Anxiety   . Depression   . COPD (chronic obstructive pulmonary disease)   . Seizures     last seizure 2 months ago  . Alcohol abuse   . Cocaine abuse   . Stroke     2009   Past Surgical History  Procedure Laterality Date  . No past surgeries     Family History  Problem Relation Age of Onset  . Cancer Mother   . Stroke Father   . Cancer Father    History  Substance Use Topics  . Smoking status: Current Every Day Smoker -- 0.50 packs/day for 18 years    Types: Cigarettes  . Smokeless tobacco: Never Used  . Alcohol Use: Yes     Comment: occ    Review of Systems  All other systems reviewed and are negative.     Allergies  Peanut-containing drug products and Tramadol  Home Medications   Prior to Admission medications   Not on File   BP 118/74  Pulse 80  Temp(Src) 97.4 F (36.3 C) (Oral)  Resp 16  SpO2 93% Physical Exam  Nursing note and vitals reviewed. Constitutional: He is oriented to person, place, and time. He appears well-developed and well-nourished. He appears distressed (he is uncomfortable).  HENT:  Head:  Normocephalic and atraumatic.  Right Ear: External ear normal.  Left Ear: External ear normal.  Eyes: Conjunctivae and EOM are normal. Pupils are equal, round, and reactive to light.  Neck: Normal range of motion and phonation normal. Neck supple.  Cardiovascular: Normal rate, regular rhythm and normal heart sounds.   Pulmonary/Chest: Effort normal and breath sounds normal. He exhibits no bony tenderness.  Abdominal: Soft. There is no tenderness.  Musculoskeletal: Normal range of motion.  Left hand- tender and swollen middle finger and dorsum of hand. There is evidence of puncture marks on the dorsal aspect of the long finger, x2. Patient has pain with attempted flexion of the left long finger. Marks on the hand, time, to delineate the swelling. He does not have any tenderness in the left forearm or left elbow.  Neurological: He is alert and oriented to person, place, and time. No cranial nerve deficit or sensory deficit. He exhibits normal muscle tone. Coordination normal.  Skin: Skin is warm, dry and intact.  Psychiatric: He has a normal mood and affect. His behavior is normal. Judgment and thought content normal.    ED Course  Procedures (including critical care time)  Medications  naloxone (NARCAN) injection 0.4 mg (not administered)    And  sodium chloride 0.9 % injection 9 mL (not  administered)  ondansetron (ZOFRAN) injection 4 mg (not administered)  diphenhydrAMINE (BENADRYL) injection 12.5 mg (not administered)    Or  diphenhydrAMINE (BENADRYL) 12.5 MG/5ML elixir 12.5 mg (not administered)  HYDROmorphone (DILAUDID) PCA injection 0.3 mg/mL (not administered)  Influenza vac split quadrivalent PF (FLUARIX) injection 0.5 mL (not administered)  crotalidae polyvalent immune fab (CROFAB) 4 vial in sodium chloride 0.9 % 322 mL infusion (72 mLs Intravenous Given 05/15/14 1342)  0.9 %  sodium chloride infusion (0 mLs Intravenous Stopped 05/15/14 1420)  HYDROmorphone (DILAUDID) injection 2  mg (2 mg Intravenous Given 05/15/14 1319)  HYDROmorphone (DILAUDID) injection 1 mg (1 mg Intravenous Given 05/15/14 1449)    Patient Vitals for the past 24 hrs:  BP Temp Temp src Pulse Resp SpO2  05/15/14 1600 118/74 mmHg - - - - -  05/15/14 1515 126/79 mmHg - - - - -  05/15/14 1454 127/85 mmHg - - 80 16 93 %  05/15/14 1450 127/85 mmHg - - 76 - 95 %  05/15/14 1445 121/73 mmHg - - 74 - -  05/15/14 1440 114/75 mmHg - - 57 - 92 %  05/15/14 1435 124/82 mmHg - - 88 - 94 %  05/15/14 1430 122/73 mmHg - - 84 - -  05/15/14 1425 124/72 mmHg - - 64 - -  05/15/14 1420 122/65 mmHg - - 63 - -  05/15/14 1419 - - - 80 - 93 %  05/15/14 1415 126/79 mmHg - - 66 - 96 %  05/15/14 1414 - - - 81 - 96 %  05/15/14 1413 - - - 83 - 95 %  05/15/14 1412 - - - 71 - 95 %  05/15/14 1411 - - - 84 - 93 %  05/15/14 1410 132/83 mmHg - - 72 - 97 %  05/15/14 1409 - - - 63 - 94 %  05/15/14 1408 - - - 88 - 94 %  05/15/14 1407 - - - 84 - 93 %  05/15/14 1405 124/87 mmHg - - 62 - 94 %  05/15/14 1404 - - - 66 - 95 %  05/15/14 1403 - - - 84 - 94 %  05/15/14 1402 - - - 84 - 96 %  05/15/14 1401 - - - 66 - 95 %  05/15/14 1400 126/80 mmHg - - 72 - 98 %  05/15/14 1355 132/86 mmHg - - 84 - 96 %  05/15/14 1350 125/75 mmHg - - 80 - 94 %  05/15/14 1348 - - - 79 - 94 %  05/15/14 1347 - - - 82 - 94 %  05/15/14 1346 - - - 84 - 94 %  05/15/14 1345 - - - 78 - 94 %  05/15/14 1344 - - - 76 - 93 %  05/15/14 1343 - - - 78 - 95 %  05/15/14 1342 - - - 71 - 94 %  05/15/14 1341 - - - 63 - 94 %  05/15/14 1336 - - - 72 - 93 %  05/15/14 1330 116/78 mmHg - - 66 - 86 %  05/15/14 1315 101/73 mmHg - - 72 - 94 %  05/15/14 1300 105/79 mmHg - - 69 - 94 %  05/15/14 1249 104/70 mmHg - - 65 20 94 %  05/15/14 1240 98/64 mmHg 97.4 F (36.3 C) Oral 66 16 94 %     Above Clinical Image at 13:15 indicating swelling is just past wrist extension crease at this time. Significant progression since 12:49. Cro Fab ordered.     3:50  PM Reevaluation with  update and discussion. After initial assessment and treatment, an updated evaluation reveals that the swelling has extended somewhat above the area in the above picture (about 2 inches). The swelling does not approach the midforearm, or elbow. Ora Bollig L   16:00- persistent pain requires further narcotic analgesia. PCA Dilaudid ordered. No evidence for compartment syndrome at this time.  4:04 PM-Consult complete with Dr. Charlies Silvers. Patient case explained and discussed. She agrees to admit patient for further evaluation and treatment. Call ended at 16:35   CRITICAL CARE Performed by: Richarda Blade Total critical care time: 40 minutes Critical care time was exclusive of separately billable procedures and treating other patients. Critical care was necessary to treat or prevent imminent or life-threatening deterioration. Critical care was time spent personally by me on the following activities: development of treatment plan with patient and/or surrogate as well as nursing, discussions with consultants, evaluation of patient's response to treatment, examination of patient, obtaining hSteni, time, and then in states that with envenomation, and snakebite,story from patient or surrogate, ordering and performing treatments and interventions, ordering and review of laboratory studies, ordering and review of radiographic studies, pulse oximetry and re-evaluation of patient's condition.  Labs Review Labs Reviewed  CBC WITH DIFFERENTIAL - Abnormal; Notable for the following:    RBC 3.95 (*)    Hemoglobin 12.4 (*)    MCV 106.6 (*)    MCHC 29.5 (*)    RDW 15.8 (*)    Platelets 148 (*)    All other components within normal limits  BASIC METABOLIC PANEL - Abnormal; Notable for the following:    Glucose, Bld 105 (*)    Calcium 8.3 (*)    All other components within normal limits  PROTIME-INR  FIBRINOGEN  FIBRINOGEN  FIBRINOGEN  FIBRINOGEN  CBC WITH DIFFERENTIAL  CBC WITH DIFFERENTIAL  CBC WITH  DIFFERENTIAL  PROTIME-INR  PROTIME-INR  PROTIME-INR  URINE RAPID DRUG SCREEN (HOSP PERFORMED)  ETHANOL    Imaging Review No results found.   EKG Interpretation None      MDM   Final diagnoses:  Snake bite poisoning    Snakebite, with envenomation, and reaction extending above the wrist. This required treatment with CroFab. He will need admission for observation and continued treatment with attention to vascular status. As of 1600 hours, there is no evidence for compartment syndrome.  Nursing Notes Reviewed/ Care Coordinated, and agree without changes. Applicable Imaging Reviewed.  Interpretation of Laboratory Data incorporated into ED treatment  Plan: Admit to hospitalist service  Richarda Blade, MD 05/15/14 [phone]

## 2014-05-15 NOTE — ED Notes (Signed)
EtCO2 35

## 2014-05-15 NOTE — ED Notes (Signed)
EtCO2 47

## 2014-05-15 NOTE — H&P (Signed)
[facility] History and Physical  Mario Andrews MHD:[id number] DOB: 1961/04/21 DOA: 05/15/2014  Referring physician: ER physician PCP: No PCP Per Patient   Chief Complaint: snake bite  HPI:  53 year old male with past medical history of drug abuse who presented to [facility] ED 05/15/2014 after snake bite which per pt was copperhead. Pt reported having pain and swelling after the bite. His left hand started to get little darker with time. No other complaints. He denies lightheadedness or loss of consciousness. No other GI or GU complaints. No respiratory distress. In ED, pt received crofab and poison control was contacted. They recommended measuring the circumference of the hand one hour after crofab and every two hours after that. Needs repeat INR at 6 hour mark from initial one and after that to make a decision if pt can go home with oral pain meds. He complained of severe pain and [facility] asked for further admission and observation or next 24 hours.   Assessment & Plan    Active Problems:   Snake bite  Has received anti-venom in ED; poison control notified and recommended measuring the circumference of the hand one hour after crofab and every two hours after that. Needs repeat INR at 6 hour mark from initial one and after that to make a decision if pt can go home with oral pain meds.  Pt reported pain 8/10 in intensity.   Left hand with swelling and slight discoloration but no erythema.  Provide supportive care with analgesia as needed. Pt is on dilaudid PCA which was started in ED. Please do not give extra doses of narcotics.   Substance abuse  Alcohol level pending; UDS positive for cocaine.    DVT prophylaxis:   SCD's bilaterally   Radiological Exams on Admission: No results found.   Code Status: Full Family Communication: Plan of care discussed with the patient  Disposition Plan: Admit for further evaluation  Leisa Lenz, MD  [facility] Pager [phone]  Review  of Systems:  Constitutional: Negative for fever, chills and malaise/fatigue. Negative for diaphoresis.  HENT: Negative for hearing loss, ear pain, nosebleeds, congestion, sore throat, neck pain, tinnitus and ear discharge.   Eyes: Negative for blurred vision, double vision, photophobia, pain, discharge and redness.  Respiratory: Negative for cough, hemoptysis, sputum production, shortness of breath, wheezing and stridor.   Cardiovascular: Negative for chest pain, palpitations, orthopnea, claudication and leg swelling.  Gastrointestinal: Negative for nausea, vomiting and abdominal pain. Negative for heartburn, constipation, blood in stool and melena.  Genitourinary: Negative for dysuria, urgency, frequency, hematuria and flank pain.  Musculoskeletal: Negative for myalgias, back pain, joint pain and falls.  Skin: snake bite, no redness Neurological: Negative for dizziness and weakness. Negative for tingling, tremors, sensory change, speech change, focal weakness, loss of consciousness and headaches.  Endo/Heme/Allergies: Negative for environmental allergies and polydipsia. Does not bruise/bleed easily.  Psychiatric/Behavioral: Negative for suicidal ideas. The patient is not nervous/anxious.      Past Medical History  Diagnosis Date  . Anxiety   . Depression   . COPD (chronic obstructive pulmonary disease)   . Seizures     last seizure 2 months ago  . Alcohol abuse   . Cocaine abuse   . Stroke     2009   Past Surgical History  Procedure Laterality Date  . No past surgeries     Social History:  reports that he has been smoking Cigarettes.  He has a 9 pack-year smoking history. He has never  used smokeless tobacco. He reports that he drinks alcohol. He reports that he does not use illicit drugs.  Allergies  Allergen Reactions  . Peanut-Containing Drug Products Hives  . Tramadol Nausea Only    "upset stomach"    Family History:  Family History  Problem Relation Age of Onset  .  Cancer Mother   . Stroke Father   . Cancer Father      Prior to Admission medications   Not on File   Physical Exam: Filed Vitals:   05/15/14 1450 05/15/14 1454 05/15/14 1515 05/15/14 1600  BP: 127/85 127/85 126/79 118/74  Pulse: 76 80    Temp:      TempSrc:      Resp:  16    SpO2: 95% 93%      Physical Exam  Constitutional: Appears well-developed and well-nourished. No distress.  HENT: Normocephalic. No tonsillar erythema or exudates Eyes: Conjunctivae and EOM are normal. PERRLA, no scleral icterus.  Neck: Normal ROM. Neck supple. No JVD. No tracheal deviation. No thyromegaly.  CVS: RRR, S1/S2 +, no murmurs, no gallops, no carotid bruit.  Pulmonary: Effort and breath sounds normal, no stridor, rhonchi, wheezes, rales.  Abdominal: Soft. BS +,  no distension, tenderness, rebound or guarding.  Musculoskeletal: Normal range of motion. Left hand swollen compared with right hand, no eythema Lymphadenopathy: No lymphadenopathy noted, cervical, inguinal. Neuro: Alert. Normal reflexes, muscle tone coordination. No focal neurologic deficits. Skin: Skin is warm and dry. No rash noted. Not diaphoretic. No erythema. No pallor.  Psychiatric: Normal mood and affect. Behavior, judgment, thought content normal.   Labs on Admission:  Basic Metabolic Panel:  Recent Labs Lab 05/15/14 1311  NA 138  K 3.9  CL 100  CO2 23  GLUCOSE 105*  BUN 10  CREATININE 0.75  CALCIUM 8.3*   Liver Function Tests: No results found for this basename: AST, ALT, ALKPHOS, BILITOT, PROT, ALBUMIN,  in the last 168 hours No results found for this basename: LIPASE, AMYLASE,  in the last 168 hours No results found for this basename: AMMONIA,  in the last 168 hours CBC:  Recent Labs Lab 05/15/14 1311  WBC 5.6  NEUTROABS 2.6  HGB 12.4*  HCT 42.1  MCV 106.6*  PLT 148*   Cardiac Enzymes: No results found for this basename: CKTOTAL, CKMB, CKMBINDEX, TROPONINI,  in the last 168 hours BNP: No components  found with this basename: POCBNP,  CBG: No results found for this basename: GLUCAP,  in the last 168 hours  If 7PM-7AM, please contact night-coverage [email or web address] Password [facility] 05/15/2014, 4:36 PM

## 2014-05-15 NOTE — ED Notes (Signed)
Bed: YV85 Expected date:  Expected time:  Means of arrival:  Comments: EMS-snake bite

## 2014-05-15 NOTE — ED Notes (Signed)
Hilda Blades, from [employer], recommends: Measure the circumference of the hand one hour after crofab and every two hours after that.  Based on repeat INR at 6 hour mark and continuation of swelling, make decision whether to admit patient or send him home with oral pain medication.

## 2014-05-15 NOTE — ED Notes (Addendum)
Pt reports to ED for snake bite to left middle finger, puncture mark visible. Swelling to finger marked, swelling not yet extended to hand. Pt complains of severe pain. Pt denies dizziness, SOB, nausea, emesis, abdominal pain, chest pain, any symptoms except for pain in finger at site of snake bite.

## 2014-05-15 NOTE — ED Notes (Signed)
Per Poison Control recommendations: No prophylatic Abx or steroids Update tetanus Keep track of measurements of hand, wrist, bicep If swelling passes wrist, consider Crofab If systemic symptoms, consider Crofab Draw PT/INR, CBC, fibrinogen Monitor for 6 hours Let PC know if allergic reaction to Crofab Talk to toxicologic prior to fasciotomy

## 2014-05-16 LAB — CBC WITH DIFFERENTIAL/PLATELET
Basophils Absolute: 0.1 10*3/uL (ref 0.0–0.1)
Basophils Relative: 1 % (ref 0–1)
EOS ABS: 0.1 10*3/uL (ref 0.0–0.7)
EOS PCT: 1 % (ref 0–5)
HCT: 36.1 % — ABNORMAL LOW (ref 39.0–52.0)
HEMOGLOBIN: 11.8 g/dL — AB (ref 13.0–17.0)
LYMPHS PCT: 29 % (ref 12–46)
Lymphs Abs: 2.1 10*3/uL (ref 0.7–4.0)
MCH: 30.6 pg (ref 26.0–34.0)
MCHC: 32.7 g/dL (ref 30.0–36.0)
MCV: 93.5 fL (ref 78.0–100.0)
MONOS PCT: 7 % (ref 3–12)
Monocytes Absolute: 0.5 10*3/uL (ref 0.1–1.0)
Neutro Abs: 4.5 10*3/uL (ref 1.7–7.7)
Neutrophils Relative %: 62 % (ref 43–77)
Platelets: 145 10*3/uL — ABNORMAL LOW (ref 150–400)
RBC: 3.86 MIL/uL — AB (ref 4.22–5.81)
RDW: 15.3 % (ref 11.5–15.5)
WBC: 7.2 10*3/uL (ref 4.0–10.5)

## 2014-05-16 LAB — COMPREHENSIVE METABOLIC PANEL
ALT: 94 U/L — AB (ref 0–53)
AST: 165 U/L — ABNORMAL HIGH (ref 0–37)
Albumin: 3.3 g/dL — ABNORMAL LOW (ref 3.5–5.2)
Alkaline Phosphatase: 121 U/L — ABNORMAL HIGH (ref 39–117)
Anion gap: 11 (ref 5–15)
BUN: 8 mg/dL (ref 6–23)
CO2: 24 mEq/L (ref 19–32)
Calcium: 7.7 mg/dL — ABNORMAL LOW (ref 8.4–10.5)
Chloride: 105 mEq/L (ref 96–112)
Creatinine, Ser: 0.64 mg/dL (ref 0.50–1.35)
GFR calc Af Amer: >90 mL/min (ref >90)
GFR calc non Af Amer: >90 mL/min (ref >90)
Glucose, Bld: 99 mg/dL (ref 70–99)
Potassium: 3.9 mEq/L (ref 3.7–5.3)
SODIUM: 140 meq/L (ref 137–147)
TOTAL PROTEIN: 6.8 g/dL (ref 6.0–8.3)
Total Bilirubin: 0.7 mg/dL (ref 0.3–1.2)

## 2014-05-16 LAB — PROTIME-INR
INR: 1.13 (ref 0.00–1.49)
PROTHROMBIN TIME: 14.5 s (ref 11.6–15.2)

## 2014-05-16 LAB — GLUCOSE, CAPILLARY: Glucose-Capillary: 106 mg/dL — ABNORMAL HIGH (ref 70–99)

## 2014-05-16 LAB — FIBRINOGEN: Fibrinogen: 199 mg/dL — ABNORMAL LOW (ref 204–475)

## 2014-05-16 MED ORDER — ACETAMINOPHEN 325 MG PO TABS: 650.0000 mg | ORAL_TABLET | Freq: Four times a day (QID) | ORAL | Status: DC | PRN

## 2014-05-16 NOTE — Progress Notes (Signed)
Denise from poison control called for updates on pt. This rn returned the call and update denies with latest labs, etc. Vwilliams,rn.

## 2014-05-16 NOTE — Discharge Summary (Signed)
Physician Discharge Summary  Mario Andrews ZOX:[id number] DOB: 1961/07/22 DOA: 05/15/2014  PCP: No PCP Per Patient  Admit date: 05/15/2014 Discharge date: 05/16/2014  Recommendations for Outpatient Follow-up:  1. Follow up with PCP in next 1-2 weeks to make sure symptoms stable.  Discharge Diagnoses:  Active Problems:   Snake bite   Discharge Condition: stable' pt wants to go home today. No reports of withdrawals or tremors. Pt will have family member come pick him up and drive him home. Mental status good this am.  Diet recommendation: as tolerated   History of present illness:  53 year old male with past medical history of drug abuse who presented to [facility] ED 05/15/2014 after snake bite which per pt was copperhead. Pt reported having pain and swelling after the bite. His left hand started to get little darker with time. No other complaints  In ED, pt received crofab and poison control was contacted. They recommended measuring the circumference of the hand one hour after crofab and every two hours after that, also repeat INR at 6 hour mark from initial one and after that to make a decision if pt can go home.   Assessment & Plan    Active Problems:  Snake bite  Has received anti-venom in ED; poison control notified and recommended measuring the circumference of the hand one hour after crofab and every two hours after that. Needs repeat INR at 6 hour mark from initial one and after that to make a decision if pt can go home with oral pain meds.  Pt feels better this am Prescription provided for tylenol PRN for pain control Substance abuse  UDS positive for cocaine. DVT prophylaxis:  SCD's bilaterally    Code Status: Full  Family Communication: Plan of care discussed with the patient  Disposition Plan: Admit for further evaluation    Signed:  Leisa Lenz, MD  [facility] 05/16/2014, 11:29 AM  Pager #: [phone]  Procedures:  None   Consultations:  None     Discharge Exam: Filed Vitals:   05/16/14 0800  BP:   Pulse:   Temp:   Resp: 18   Filed Vitals:   05/16/14 0400 05/16/14 0415 05/16/14 0605 05/16/14 0800  BP:  121/71    Pulse:  68    Temp:  97.6 F (36.4 C)    TempSrc:  Oral    Resp: 10 18  18   Height:   6\' 1"  (1.854 m)   Weight:   67 kg (147 lb 11.3 oz)   SpO2: 96% 93%  100%    General: Pt is alert, follows commands appropriately, not in acute distress Cardiovascular: Regular rate and rhythm, S1/S2 +, no murmurs Respiratory: Clear to auscultation bilaterally, no wheezing, no crackles, no rhonchi Abdominal: Soft, non tender, non distended, bowel sounds +, no guarding Extremities: no edema, no cyanosis, pulses palpable bilaterally DP and PT Neuro: Grossly nonfocal  Discharge Instructions  Discharge Instructions   Call MD for:  difficulty breathing, headache or visual disturbances    Complete by:  As directed      Call MD for:  persistant dizziness or light-headedness    Complete by:  As directed      Call MD for:  persistant nausea and vomiting    Complete by:  As directed      Call MD for:  severe uncontrolled pain    Complete by:  As directed      Diet - low sodium heart healthy  Complete by:  As directed      Increase activity slowly    Complete by:  As directed             Medication List         acetaminophen 325 MG tablet  Commonly known as:  TYLENOL  Take 2 tablets (650 mg total) by mouth every 6 (six) hours as needed.          The results of significant diagnostics from this hospitalization (including imaging, microbiology, ancillary and laboratory) are listed below for reference.    Significant Diagnostic Studies: No results found.  Microbiology: No results found for this or any previous visit (from the past 240 hour(s)).   Labs: Basic Metabolic Panel:  Recent Labs Lab 05/15/14 1311 05/15/14 1900 05/16/14 0050  NA 138  --  140  K 3.9  --  3.9  CL 100  --  105  CO2 23  --  24   GLUCOSE 105*  --  99  BUN 10  --  8  CREATININE 0.75  --  0.64  CALCIUM 8.3*  --  7.7*  MG  --  1.8  --   PHOS  --  3.4  --    Liver Function Tests:  Recent Labs Lab 05/16/14 0050  AST 165*  ALT 94*  ALKPHOS 121*  BILITOT 0.7  PROT 6.8  ALBUMIN 3.3*   No results found for this basename: LIPASE, AMYLASE,  in the last 168 hours No results found for this basename: AMMONIA,  in the last 168 hours CBC:  Recent Labs Lab 05/15/14 1311 05/15/14 1900 05/16/14 0050  WBC 5.6 6.7 7.2  NEUTROABS 2.6 4.4 4.5  HGB 12.4* 12.4* 11.8*  HCT 42.1 37.5* 36.1*  MCV 106.6* 93.5 93.5  PLT 148* 151 145*   Cardiac Enzymes: No results found for this basename: CKTOTAL, CKMB, CKMBINDEX, TROPONINI,  in the last 168 hours BNP: BNP (last 3 results) No results found for this basename: PROBNP,  in the last 8760 hours CBG:  Recent Labs Lab 05/16/14 0652  GLUCAP 106*    Time coordinating discharge: Over 30 minutes

## 2014-05-16 NOTE — Progress Notes (Signed)
Pt discharged to home. DC instructions given. No concerns voiced. prescription x 1 given for tylenol. Left unit in good condition ambulating downstairs. Bus pass given. Pt refused to be wheeled downstairs. Vwilliams,rn.

## 2014-05-16 NOTE — Discharge Instructions (Signed)
Snake Bite Snakes may be either venomous (containing poison) or nonvenomous (nonpoisonous). A nonvenomous snake bite will cause trauma or a wound to the skin and possibly the deeper tissues. A venomous snake will also cause a traumatic wound, but more importantly, it may have injected venom into the wound. Snake bite venom can be extremely serious and even deadly. One type of venom may cause major skin, tissue and muscle damage, and failure of normal blood clotting. This may cause extreme swelling and pain of the affected area. Another type of venom can affect the brain and nervous system and may cause death. The treatment for venomous snake bite may require the use of antivenom medicine. If you are unsure if your bite is from a venomous snake, you MUST seek immediate medical attention. YOU MIGHT NEED A TETANUS SHOT NOW IF:  You have no idea when you had the last one.  You have never had a tetanus shot before.  The bite broke your skin. If you need a tetanus shot, and you decide not to get one, there is a rare chance of getting tetanus. Sickness from tetanus can be serious. HOME CARE INSTRUCTIONS  A snake bit you and caused a skin wound. It may or may not have been venomous. If the snake was venomous, a small amount of venom may have been injected into your skin.  Keep the bite area clean and dry.  Keep the extremity elevated above the level of the heart for the next 48 hours.  Wash the bite area 3 times daily with soap and water or an antiseptic. Apply an adhesive or gauze bandage to the bite area.  If you develop blistering of any type at the site of the bite, protect the blisters from breaking. Do not attempt to open it.  If you were given a tetanus shot, your arm may get swollen, red and warm at the shot site. This is a common response to the injection. SEEK IMMEDIATE MEDICAL CARE IF:   You develop symptoms of poisoning including increased pain, redness, swelling, blood blisters or purple  spots in the bite area, nausea, vomiting, numbness, tingling, excessive sweating, breathing difficulty, blurred vision, feelings of lightheadedness, or feeling faint. If you develop symptoms of poisoning, you MUST seek immediate medical attention.  The bite becomes infected. Symptoms may include redness, swelling, pain, tenderness, pus, red streaks running from the wound, or an oral temperature above 102 F (38.9 C), not controlled by medicine.  Your condition or wound becomes worse. MAKE SURE YOU:   Understand these instructions.  Will watch your condition.  Will get help right away if you are not doing well or get worse. Document Released: 08/11/2000 Document Revised: 11/06/2011 Document Reviewed: 01/05/2010 [facility] Patient Information 2015 [place], [place]. This information is not intended to replace advice given to you by your health care provider. Make sure you discuss any questions you have with your health care provider.

## 2014-08-23 ENCOUNTER — Emergency Department (HOSPITAL_COMMUNITY)
Admission: EM | Admit: 2014-08-23 | Discharge: 2014-08-24 | Disposition: A | Discharge status: Home / Self Care | Payer: Medicaid Other | Attending: Emergency Medicine | Admitting: Emergency Medicine

## 2014-08-23 ENCOUNTER — Encounter (HOSPITAL_COMMUNITY): Payer: Self-pay | Admitting: *Deleted

## 2014-08-23 ENCOUNTER — Emergency Department (HOSPITAL_COMMUNITY): Payer: Medicaid Other

## 2014-08-23 DIAGNOSIS — R109 Unspecified abdominal pain: Secondary | ICD-10-CM | POA: Diagnosis present

## 2014-08-23 DIAGNOSIS — R740 Nonspecific elevation of levels of transaminase and lactic acid dehydrogenase [LDH]: Secondary | ICD-10-CM | POA: Diagnosis not present

## 2014-08-23 DIAGNOSIS — Z720 Tobacco use: Secondary | ICD-10-CM | POA: Insufficient documentation

## 2014-08-23 DIAGNOSIS — Z8673 Personal history of transient ischemic attack (TIA), and cerebral infarction without residual deficits: Secondary | ICD-10-CM | POA: Insufficient documentation

## 2014-08-23 DIAGNOSIS — Z8659 Personal history of other mental and behavioral disorders: Secondary | ICD-10-CM | POA: Insufficient documentation

## 2014-08-23 DIAGNOSIS — K2970 Gastritis, unspecified, without bleeding: Secondary | ICD-10-CM | POA: Insufficient documentation

## 2014-08-23 DIAGNOSIS — J449 Chronic obstructive pulmonary disease, unspecified: Secondary | ICD-10-CM | POA: Diagnosis not present

## 2014-08-23 DIAGNOSIS — R945 Abnormal results of liver function studies: Secondary | ICD-10-CM

## 2014-08-23 DIAGNOSIS — Z8619 Personal history of other infectious and parasitic diseases: Secondary | ICD-10-CM | POA: Diagnosis not present

## 2014-08-23 DIAGNOSIS — R7989 Other specified abnormal findings of blood chemistry: Secondary | ICD-10-CM

## 2014-08-23 HISTORY — DX: Unspecified viral hepatitis C without hepatic coma: B19.20

## 2014-08-23 LAB — COMPREHENSIVE METABOLIC PANEL
ALT: 93 U/L — ABNORMAL HIGH (ref 0–53)
AST: 133 U/L — ABNORMAL HIGH (ref 0–37)
Albumin: 4.1 g/dL (ref 3.5–5.2)
Alkaline Phosphatase: 118 U/L — ABNORMAL HIGH (ref 39–117)
Anion gap: 5 (ref 5–15)
BUN: 9 mg/dL (ref 6–23)
CO2: 29 mmol/L (ref 19–32)
Calcium: 9.2 mg/dL (ref 8.4–10.5)
Chloride: 103 mEq/L (ref 96–112)
Creatinine, Ser: 0.76 mg/dL (ref 0.50–1.35)
GFR calc Af Amer: >90 mL/min (ref >90)
GFR calc non Af Amer: >90 mL/min (ref >90)
Glucose, Bld: 89 mg/dL (ref 70–99)
Potassium: 4.6 mmol/L (ref 3.5–5.1)
Sodium: 137 mmol/L (ref 135–145)
Total Bilirubin: 0.8 mg/dL (ref 0.3–1.2)
Total Protein: 7.7 g/dL (ref 6.0–8.3)

## 2014-08-23 LAB — URINALYSIS, ROUTINE W REFLEX MICROSCOPIC
Bilirubin Urine: NEGATIVE
Glucose, UA: NEGATIVE mg/dL
Hgb urine dipstick: NEGATIVE
Ketones, ur: NEGATIVE mg/dL
Leukocytes, UA: NEGATIVE
Nitrite: NEGATIVE
Protein, ur: NEGATIVE mg/dL
Specific Gravity, Urine: 1.003 — ABNORMAL LOW (ref 1.005–1.030)
Urobilinogen, UA: 0.2 mg/dL (ref 0.0–1.0)
pH: 6 (ref 5.0–8.0)

## 2014-08-23 LAB — CBC WITH DIFFERENTIAL/PLATELET
Basophils Absolute: 0 10*3/uL (ref 0.0–0.1)
Basophils Relative: 0 % (ref 0–1)
Eosinophils Absolute: 0.2 10*3/uL (ref 0.0–0.7)
Eosinophils Relative: 3 % (ref 0–5)
HCT: 41.2 % (ref 39.0–52.0)
Hemoglobin: 13.7 g/dL (ref 13.0–17.0)
Lymphocytes Relative: 46 % (ref 12–46)
Lymphs Abs: 2.9 10*3/uL (ref 0.7–4.0)
MCH: 30.4 pg (ref 26.0–34.0)
MCHC: 33.3 g/dL (ref 30.0–36.0)
MCV: 91.4 fL (ref 78.0–100.0)
Monocytes Absolute: 0.7 10*3/uL (ref 0.1–1.0)
Monocytes Relative: 11 % (ref 3–12)
Neutro Abs: 2.5 10*3/uL (ref 1.7–7.7)
Neutrophils Relative %: 40 % — ABNORMAL LOW (ref 43–77)
Platelets: 181 10*3/uL (ref 150–400)
RBC: 4.51 MIL/uL (ref 4.22–5.81)
RDW: 12.8 % (ref 11.5–15.5)
WBC: 6.4 10*3/uL (ref 4.0–10.5)

## 2014-08-23 LAB — LIPASE, BLOOD: Lipase: 27 U/L (ref 11–59)

## 2014-08-23 MED ORDER — GI COCKTAIL ~~LOC~~
30.0000 mL | Freq: Once | ORAL | Status: AC
  Administered 2014-08-23: 30 mL via ORAL
  Filled 2014-08-23: qty 30

## 2014-08-23 MED ORDER — FAMOTIDINE 20 MG PO TABS
20.0000 mg | ORAL_TABLET | Freq: Once | ORAL | Status: AC
  Administered 2014-08-23: 20 mg via ORAL
  Filled 2014-08-23: qty 1

## 2014-08-23 NOTE — ED Notes (Signed)
Bed: WLPT3 Expected date:  Expected time:  Means of arrival:  Comments: EMS 53yo M abd pain, recent Hep c dx

## 2014-08-23 NOTE — ED Notes (Signed)
Per EMS pt from home, pt called for abdominal pain x 1 month, ambulatory on scene, has been drinking, pain 10/10, hurts everytime he eats or drinks, few weeks ago was diagnosed w/ hep c. Pt ambulated to triage and to restroom w/o difficulty.

## 2014-08-23 NOTE — ED Provider Notes (Signed)
CSN: [id number]     Arrival date & time 08/23/14  2032 History   First MD Initiated Contact with Patient 08/23/14 2141     Chief Complaint  Patient presents with  . Abdominal Pain     (Consider location/radiation/quality/duration/timing/severity/associated sxs/prior Treatment) HPI Mario Andrews is a 53 y.o. male with history of alcohol abuse, polysubstance abuse, hepatitis C, COPD, presents to emergency department complaining of abdominal pain. Patient states he has had abdominal pain that is intermittent, sharp, "all over her abdomen", for 3 weeks. He states he was recently told he has hepatitis C. He states he is a drinker, but states he does not drink daily. He has not tried any medications for this. States that pain is worse when he eats. He reports nausea and vomiting there is intermittent, mostly after eating. He denies any changes in his bowels. Denies any black or tarry stools, no bright red blood in his stool. Denies any blood in his emesis. No fever, chills. No other complaints.  Past Medical History  Diagnosis Date  . Anxiety   . Depression   . COPD (chronic obstructive pulmonary disease)   . Seizures     last seizure 2 months ago  . Alcohol abuse   . Cocaine abuse   . Stroke     2009  . Hepatitis C    Past Surgical History  Procedure Laterality Date  . No past surgeries     Family History  Problem Relation Age of Onset  . Cancer Mother   . Stroke Father   . Cancer Father    History  Substance Use Topics  . Smoking status: Current Every Day Smoker -- 0.50 packs/day for 18 years    Types: Cigarettes  . Smokeless tobacco: Never Used  . Alcohol Use: Yes     Comment: occ    Review of Systems  Constitutional: Negative for fever and chills.  Respiratory: Negative for cough, chest tightness and shortness of breath.   Cardiovascular: Negative for chest pain, palpitations and leg swelling.  Gastrointestinal: Positive for nausea, vomiting and abdominal pain.  Negative for diarrhea, blood in stool and abdominal distention.  Genitourinary: Negative for dysuria, urgency, frequency and hematuria.  Musculoskeletal: Negative for myalgias and arthralgias.  Skin: Negative for rash.  Allergic/Immunologic: Negative for immunocompromised state.  Neurological: Negative for dizziness, weakness, light-headedness, numbness and headaches.  All other systems reviewed and are negative.     Allergies  Peanut-containing drug products and Tramadol  Home Medications   Prior to Admission medications   Medication Sig Start Date End Date Taking? Authorizing Provider  acetaminophen (TYLENOL) 325 MG tablet Take 2 tablets (650 mg total) by mouth every 6 (six) hours as needed. 05/16/14  Yes Robbie Lis, MD   BP 119/83 mmHg  Pulse 70  Temp(Src) 97.7 F (36.5 C) (Oral)  Resp 18  Ht 6\' 1"  (1.854 m)  Wt 150 lb (68.04 kg)  BMI 19.79 kg/m2  SpO2 99% Physical Exam  Constitutional: He appears well-developed and well-nourished. No distress.  HENT:  Head: Normocephalic and atraumatic.  Eyes: Conjunctivae are normal.  Neck: Neck supple.  Cardiovascular: Normal rate, regular rhythm and normal heart sounds.   Pulmonary/Chest: Effort normal. No respiratory distress. He has no wheezes. He has no rales.  Abdominal: Soft. Bowel sounds are normal. He exhibits no distension. There is tenderness. There is no rebound.  Epigastric tenderness  Musculoskeletal: He exhibits no edema.  Neurological: He is alert.  Skin: Skin is warm and  dry.  Nursing note and vitals reviewed.   ED Course  Procedures (including critical care time) Labs Review Labs Reviewed  CBC WITH DIFFERENTIAL - Abnormal; Notable for the following:    Neutrophils Relative % 40 (*)    All other components within normal limits  COMPREHENSIVE METABOLIC PANEL - Abnormal; Notable for the following:    AST 133 (*)    ALT 93 (*)    Alkaline Phosphatase 118 (*)    All other components within normal limits   URINALYSIS, ROUTINE W REFLEX MICROSCOPIC - Abnormal; Notable for the following:    Color, Urine STRAW (*)    Specific Gravity, Urine 1.003 (*)    All other components within normal limits  LIPASE, BLOOD    Imaging Review No results found.   EKG Interpretation None      MDM   Final diagnoses:  Abdominal pain  Gastritis  Elevated LFTs    Pt with abdominal pain for a month, pain mainly in epigastric area. Hx of alcohol abuse and hep c. At this time, abdomen is benign, ttp in epigastric area, no guarding or rebound tenderness. WIll get labs, including LFTs, lipase, [place] abd.    12:02 AM Labs showing elevation in LFTs. Lipase unremarkable. [place] negative. Most likely alcoholic gastritis vs pud. Doubt perforation or any surgical pathology, abdomen is soft. Pt tolerating POs. Home with pepcid, prilosec, maalox. Follow up with pcp. Return precautions discussed.   Filed Vitals:   08/23/14 2040 08/23/14 2326  BP: 119/83 117/77  Pulse: 70 60  Temp: 97.7 F (36.5 C) 97.7 F (36.5 C)  TempSrc: Oral Oral  Resp: 18 18  Height: 6\' 1"  (1.854 m)   Weight: 150 lb (68.04 kg)   SpO2: 99% 99%       Renold Genta, PA-C 08/24/14 0006  Virgel Manifold, MD 08/24/14 [phone]

## 2014-08-24 MED ORDER — FAMOTIDINE 20 MG PO TABS: 20.0000 mg | ORAL_TABLET | Freq: Two times a day (BID) | ORAL | Status: DC

## 2014-08-24 MED ORDER — OMEPRAZOLE 20 MG PO CPDR: 20.0000 mg | DELAYED_RELEASE_CAPSULE | Freq: Every day | ORAL | Status: DC

## 2014-08-24 NOTE — Discharge Instructions (Signed)
Stop drinking alcohol. pepcid and prilosec as prescribed. Take maalox for acute symptoms. Follow up with your doctor if not ipmroving.   Gastritis, Adult Gastritis is soreness and swelling (inflammation) of the lining of the stomach. Gastritis can develop as a sudden onset (acute) or long-term (chronic) condition. If gastritis is not treated, it can lead to stomach bleeding and ulcers. CAUSES  Gastritis occurs when the stomach lining is weak or damaged. Digestive juices from the stomach then inflame the weakened stomach lining. The stomach lining may be weak or damaged due to viral or bacterial infections. One common bacterial infection is the Helicobacter pylori infection. Gastritis can also result from excessive alcohol consumption, taking certain medicines, or having too much acid in the stomach.  SYMPTOMS  In some cases, there are no symptoms. When symptoms are present, they may include:  Pain or a burning sensation in the upper abdomen.  Nausea.  Vomiting.  An uncomfortable feeling of fullness after eating. DIAGNOSIS  Your caregiver may suspect you have gastritis based on your symptoms and a physical exam. To determine the cause of your gastritis, your caregiver may perform the following:  Blood or stool tests to check for the H pylori bacterium.  Gastroscopy. A thin, flexible tube (endoscope) is passed down the esophagus and into the stomach. The endoscope has a light and camera on the end. Your caregiver uses the endoscope to view the inside of the stomach.  Taking a tissue sample (biopsy) from the stomach to examine under a microscope. TREATMENT  Depending on the cause of your gastritis, medicines may be prescribed. If you have a bacterial infection, such as an H pylori infection, antibiotics may be given. If your gastritis is caused by too much acid in the stomach, H2 blockers or antacids may be given. Your caregiver may recommend that you stop taking aspirin, ibuprofen, or other  nonsteroidal anti-inflammatory drugs (NSAIDs). HOME CARE INSTRUCTIONS  Only take over-the-counter or prescription medicines as directed by your caregiver.  If you were given antibiotic medicines, take them as directed. Finish them even if you start to feel better.  Drink enough fluids to keep your urine clear or pale yellow.  Avoid foods and drinks that make your symptoms worse, such as:  Caffeine or alcoholic drinks.  Chocolate.  Peppermint or mint flavorings.  Garlic and onions.  Spicy foods.  Citrus fruits, such as oranges, lemons, or limes.  Tomato-based foods such as sauce, chili, salsa, and pizza.  Fried and fatty foods.  Eat small, frequent meals instead of large meals. SEEK IMMEDIATE MEDICAL CARE IF:   You have black or dark red stools.  You vomit blood or material that looks like coffee grounds.  You are unable to keep fluids down.  Your abdominal pain gets worse.  You have a fever.  You do not feel better after 1 week.  You have any other questions or concerns. MAKE SURE YOU:  Understand these instructions.  Will watch your condition.  Will get help right away if you are not doing well or get worse. Document Released: 08/08/2001 Document Revised: 02/13/2012 Document Reviewed: 09/27/2011 [facility] Patient Information 2015 [place], [place]. This information is not intended to replace advice given to you by your health care provider. Make sure you discuss any questions you have with your health care provider.

## 2014-08-25 ENCOUNTER — Telehealth (HOSPITAL_BASED_OUTPATIENT_CLINIC_OR_DEPARTMENT_OTHER): Payer: Self-pay | Admitting: Emergency Medicine

## 2014-08-29 ENCOUNTER — Emergency Department (HOSPITAL_COMMUNITY)
Admission: EM | Admit: 2014-08-29 | Discharge: 2014-08-29 | Disposition: A | Discharge status: Home / Self Care | Payer: Medicaid Other | Attending: Emergency Medicine | Admitting: Emergency Medicine

## 2014-08-29 DIAGNOSIS — Z8669 Personal history of other diseases of the nervous system and sense organs: Secondary | ICD-10-CM | POA: Insufficient documentation

## 2014-08-29 DIAGNOSIS — J441 Chronic obstructive pulmonary disease with (acute) exacerbation: Secondary | ICD-10-CM | POA: Diagnosis not present

## 2014-08-29 DIAGNOSIS — Z79899 Other long term (current) drug therapy: Secondary | ICD-10-CM | POA: Diagnosis not present

## 2014-08-29 DIAGNOSIS — Z8619 Personal history of other infectious and parasitic diseases: Secondary | ICD-10-CM | POA: Insufficient documentation

## 2014-08-29 DIAGNOSIS — F419 Anxiety disorder, unspecified: Secondary | ICD-10-CM | POA: Insufficient documentation

## 2014-08-29 DIAGNOSIS — Z8673 Personal history of transient ischemic attack (TIA), and cerebral infarction without residual deficits: Secondary | ICD-10-CM | POA: Insufficient documentation

## 2014-08-29 DIAGNOSIS — Z720 Tobacco use: Secondary | ICD-10-CM | POA: Diagnosis not present

## 2014-08-29 NOTE — ED Notes (Signed)
Pt went to [facility] and was smoking. Security notified.

## 2014-08-29 NOTE — ED Provider Notes (Signed)
CSN: [id number]     Arrival date & time 08/29/14  1548 History   First MD Initiated Contact with Patient 08/29/14 1744     Chief Complaint  Patient presents with  . Shortness of Breath  . Anxiety     (Consider location/radiation/quality/duration/timing/severity/associated sxs/prior Treatment) HPI   54 year old male with history of COPD, anxiety, polysubstance abuse who was brought here via EMS for evaluation of shortness of breath. Patient reports that his sister is a drug abuser and he had a verbal argument with her this morning. Patient states that she call the police on him and he was very upset about that. Due to the situation he became short of breath and felt anxious which has now improved. He denies any fever, chills, productive cough or hemoptysis. At this time he denies any SI/HI. He admits that he does straight alcohol her regular basis, last use was today, total of 42 ounces. He request for help with his alcohol abuse. He has been seen in the ED several times in the past for similar complaint. He denies having any active pain at this time. No other specific complaint.  Past Medical History  Diagnosis Date  . Anxiety   . Depression   . COPD (chronic obstructive pulmonary disease)   . Seizures     last seizure 2 months ago  . Alcohol abuse   . Cocaine abuse   . Stroke     2009  . Hepatitis C    Past Surgical History  Procedure Laterality Date  . No past surgeries     Family History  Problem Relation Age of Onset  . Cancer Mother   . Stroke Father   . Cancer Father    History  Substance Use Topics  . Smoking status: Current Every Day Smoker -- 0.50 packs/day for 18 years    Types: Cigarettes  . Smokeless tobacco: Never Used  . Alcohol Use: Yes     Comment: occ    Review of Systems  Constitutional: Negative for fever.  Respiratory: Positive for shortness of breath. Negative for cough and wheezing.   Cardiovascular: Negative for chest pain.  Musculoskeletal:  Negative for back pain.  Skin: Negative for rash.  Neurological: Negative for headaches.  All other systems reviewed and are negative.     Allergies  Peanut-containing drug products and Tramadol  Home Medications   Prior to Admission medications   Medication Sig Start Date End Date Taking? Authorizing Provider  acetaminophen (TYLENOL) 325 MG tablet Take 2 tablets (650 mg total) by mouth every 6 (six) hours as needed. 05/16/14   Robbie Lis, MD  famotidine (PEPCID) 20 MG tablet Take 1 tablet (20 mg total) by mouth 2 (two) times daily. 08/24/14   Tatyana A Kirichenko, PA-C  omeprazole (PRILOSEC) 20 MG capsule Take 1 capsule (20 mg total) by mouth daily. 08/24/14   Tatyana A Kirichenko, PA-C   There were no vitals taken for this visit. Physical Exam  Constitutional: He is oriented to person, place, and time. He appears well-developed and well-nourished. No distress.  HENT:  Head: Atraumatic.  Eyes: Conjunctivae are normal.  Neck: Normal range of motion. Neck supple.  Cardiovascular: Normal rate and regular rhythm.   Pulmonary/Chest: Effort normal and breath sounds normal. No respiratory distress. He has no wheezes. He has no rales.  Abdominal: Soft. There is no tenderness.  Musculoskeletal: He exhibits no edema.  Neurological: He is alert and oriented to person, place, and time. GCS eye subscore is  4. GCS verbal subscore is 5. GCS motor subscore is 6.  Skin: No rash noted.  Psychiatric: He has a normal mood and affect. His speech is normal and behavior is normal. Thought content is not paranoid. He expresses no homicidal and no suicidal ideation.    ED Course  Procedures (including critical care time)  5:58 PM Patient is here requesting for help with his alcohol abuse. He is currently not intoxicated. He also does not suicidal homicidal. Patient reported he did experiencing bouts of anxiety after his sister called the cough on him due to the verbal augment this morning. No  evidence to suggest a COPD exacerbation as patient is in no acute respiratory distress and no evidence of hypoxia. Patient given list of resources for outpatient follow-up including alcohol treatment. Return precautions discussed.  Patient was also found smoking in the bathroom. Patient made aware that this is an unacceptable behavior.   Labs Review Labs Reviewed - No data to display  Imaging Review No results found.   EKG Interpretation None      MDM   Final diagnoses:  Anxiety    BP 103/73 mmHg  Pulse 66  Temp(Src) 97.9 F (36.6 C) (Oral)  Resp [address]     Domenic Moras, PA-C 08/29/14 1828  Charlesetta Shanks, MD 08/30/14 [phone]

## 2014-08-29 NOTE — ED Notes (Signed)
Denies SI or HI 

## 2014-08-29 NOTE — ED Notes (Signed)
EMS reports pt has been arguing with sister all day. Feels short of breath, breathing treatment enroute then pt took a nap. Ambulatory on arrival.

## 2014-08-29 NOTE — ED Notes (Signed)
Pt sitting in wheelchair in lobby. Pt sleeping when name called. Pt aroused to verbal/physical stimulus. Pt stated that he was in pain when he got to exam room.

## 2014-08-29 NOTE — Discharge Instructions (Signed)
Use resource below to find outpatient care for your alcohol problem.   Emergency Department Resource Guide 1) Find a Doctor and Pay Out of Pocket Although you won't have to find out who is covered by your insurance plan, it is a good idea to ask around and get recommendations. You will then need to call the office and see if the doctor you have chosen will accept you as a new patient and what types of options they offer for patients who are self-pay. Some doctors offer discounts or will set up payment plans for their patients who do not have insurance, but you will need to ask so you aren't surprised when you get to your appointment.  2) Contact Your Local Health Department Not all health departments have doctors that can see patients for sick visits, but many do, so it is worth a call to see if yours does. If you don't know where your local health department is, you can check in your phone book. The CDC also has a tool to help you locate your state's health department, and many state websites also have listings of all of their local health departments.  3) Find a [facility] If your illness is not likely to be very severe or complicated, you may want to try a walk in clinic. These are popping up all over the country in pharmacies, drugstores, and shopping centers. They're usually staffed by nurse practitioners or physician assistants that have been trained to treat common illnesses and complaints. They're usually fairly quick and inexpensive. However, if you have serious medical issues or chronic medical problems, these are probably not your best option.  No Primary Care Doctor: - Call Health Connect at  [phone] - they can help you locate a primary care doctor that  accepts your insurance, provides certain services, etc. - Physician Referral Service- [phone]  Chronic Pain Problems: Organization         Address  Phone   Notes  [facility]  [phone] Patients need  to be referred by their primary care doctor.   Medication Assistance: Organization         Address  Phone   Notes  [facility] Medication [facility] [address] [place], [place] [address] [phone] --Must be a resident of [facility] -- Must have NO insurance coverage whatsoever (no Medicaid/ Medicare, etc.) -- The pt. MUST have a primary care doctor that directs their care regularly and follows them in the community   MedAssist  [phone]   [employer]  [phone]    Agencies that provide inexpensive medical care: Organization         Address  Phone   Notes  [place]  [phone]   [facility]    [phone]   [facility] [place],  [phone]   [place] [address], [place] [phone]   Planned Parenthood    [phone]   [facility]    [phone]   [place] and [place] [address], [place] Phone:  [phone], Fax:  [phone] Hours of Operation:  9 am - 6 pm, M-F.  Also accepts Medicaid/Medicare and self-pay.  [facility] for [place] [address], [address], [place] Phone: [phone], Fax: [phone]. Hours of Operation:  8:30 am - 5:30  pm, M-F.  Also accepts Medicaid and self-pay.  [facility] [place] [address] [place] Phone: [phone]   [place], [place], [place] [phone], Ext. [phone] Mondays & Thursdays: 7-9 AM.  First 15 patients are seen on a first come, first serve basis.    [place] Providers:  Organization         Address  Phone   Notes  [facility] [address], [place] [phone] Also accepts self-pay patients.  [facility] [address], [place]  [phone]   [place], [address], [place] [phone]   [facility] [address] [place] [phone]   Lucianne Lei [address], [place]   [phone] Only accepts [place] Access [place] patients after they have their name applied to their card.   Self-Pay (no insurance) in [facility]:  Organization         Address  Phone   Notes  Sickle Cell Patients, [facility] [place] [phone]   [facility] [place] [phone]   [facility] [place]  [place], [place], [place] [phone]   [facility]/Dr. Osei-Bonsu  [address] [place] or [address], [place] [phone] Phone number for both [place] and [place] locations is the same.  Urgent Medical and [facility] [address] [place] [phone]   [facility] [place] [address], [place] or [address] [phone]   [facility] [address] [place] [phone], phone; [phone], fax Sees patients 1st and 3rd Saturday of every month.  Must not qualify for public or private insurance (i.e. Medicaid, Medicare, [place] Health Choice, Veterans' Benefits)  Household income should be no more than 200% of the poverty level The clinic cannot treat you if you are pregnant or think you are pregnant  Sexually transmitted diseases are not treated at the clinic.    Dental Care: Organization         Address  Phone  Notes  [facility] Department of [facility] [place] [phone] Accepts children up to age 25 who are enrolled in [place] or [place]; pregnant women with a Medicaid card; and children who have applied for Medicaid or [place] Health Choice, but were declined, whose  parents can pay a reduced fee at time of service.  [facility] Department of [facility]  [address], [place] [phone] Accepts children up to age 46 who are enrolled in [place] or [place]; pregnant women with a Medicaid card; and children who have applied for Medicaid or [place] Health Choice, but were declined, whose parents can pay a reduced fee at time of service.  [facility] Adult [facility] Access PROGRAM  [place] [phone] Patients are seen by appointment only. Walk-ins are not accepted. [place] will see patients 102 years of age and older. Monday - Tuesday (8am-5pm) Most Wednesdays (8:30-5pm) $30 per visit, cash only  [facility] Adult Dental Access PROGRAM  [address], [facility] [phone] Patients are seen by appointment only. Walk-ins are not  accepted. [place] will see patients 44 years of age and older. One Wednesday Evening (Monthly: Volunteer Based).  $30 per visit, cash only  [place]  [phone] for adults; Children under age 12, call Graduate Pediatric Dentistry at [phone]. Children aged 74-14, please call [phone] to request a pediatric application.  Dental services are provided in all areas of dental care including fillings, crowns and bridges, complete and partial dentures, implants, gum treatment, root canals, and extractions. Preventive care is also provided. Treatment is provided to both adults and children. Patients are selected via a lottery and there is often a waiting list.   [facility] ([place]) [address] [place]  [phone] [email or web address]   [facility] [address] [place], [place] [phone], Ext. [phone] Second and Fourth Thursday of each month, opens at 6:30 AM; Clinic ends at 9 AM.  Patients are seen on a first-come first-served basis, and a limited number are seen during each clinic.   [facility]   [address] Hillard Danker [place], [place] [phone]   Eligibility Requirements You must have lived in [place], [place], or [place] counties for at least the last three months.   You cannot be eligible for state or federal sponsored [employer], including [employer], [place], or [employer].   You generally cannot be eligible for healthcare insurance through your employer.    How to apply: Eligibility screenings are held every Tuesday and Wednesday afternoon from 1:00 pm until 4:00 pm. You do not need an appointment for the interview!  [facility] [address] [place], [place]   [place]  [place] Department  [place]  [phone]    Behavioral Health Resources in the Community: Intensive Outpatient Programs Organization         Address  Phone  Notes  [place]. [address] [place], [place] [phone]   [facility] Outpatient [address] [place], [place]   ADS: Alcohol & Drug Svcs [address] [place], [place]   [place] [address]  [place], [place] or [phone]   Substance Abuse Resources Organization         Address  Phone  Notes  Alcohol and Drug Services  [phone]   [place]  [phone]   [facility] [place]   Chinita Pester  [phone]   Residential & Outpatient Substance Abuse Program  [phone]   Psychological Services Organization         Address  Phone  Notes  [facility] [place]  [place]  [phone]   [place] [address] [place] or [phone]    Mobile Crisis Teams Organization         Address  Phone  Notes  [facility], Mobile Crisis Care Unit  [phone]     [facility]ive Psychotherapeutic Services  [address] [place], [place]   [place] Levels [address], [place] [phone]    Self-Help/Support Groups Organization         Address  Phone             Notes  [place]. of [place] - variety of support groups  [place] Call for more information  Narcotics Anonymous (NA),  [facility] [address], [employer] [place]  2 meetings at this location   Residential Facilities manager         Address  Phone  Notes  [facility] [place],    [place]  [phone]   [facility]  [address], [place] [address], [place], [place]   [place], [place] [phone] Admissions: 8am-3pm M-F  Incentives Substance [place] [address],    [place], [place] [phone]   [facility] [address] [place], [place], [place]   [facility] [address]  [place], [place]   [facility]ight Programs - Intensive Outpatient [address] [place] [address], [place], [place]   [facility] ([place].,  [place], [place] [phone] or [phone]   [facility] Services (RTS) [address] [place], [place] Accepts Medicaid  [facility] [place] [address],  [place] [phone] Substance Abuse/Addiction Treatment   [facility] Organization         Address  Phone  Notes  [facility]  [phone]   Domenic Schwab, PhD [address] Arlis [place], [place]   [phone] or [phone]   [place], [place] [phone]   [facility] [address], [place], [place] [phone] Insurance/Medicaid/sponsorship through [facility] and Families [address] [place]                                     [place], [place] [phone] [place] [address], [place] [phone]    Dr. Adele Schilder  [phone]   [facility] [place] Dept. 1) [address], [place] 2) [place] 3)  [place] [address], [place] [phone]  [phone]   [place] [phone] or [phone] (After Hours)

## 2014-08-29 NOTE — ED Notes (Signed)
Pt alert and oriented. NO acute distress. Patietnt c/o being sleepy. Sats 93% unlabored .

## 2014-09-10 ENCOUNTER — Other Ambulatory Visit: Payer: Self-pay

## 2014-09-17 ENCOUNTER — Emergency Department (HOSPITAL_COMMUNITY)
Admission: EM | Admit: 2014-09-17 | Discharge: 2014-09-18 | Disposition: A | Discharge status: Home / Self Care | Payer: Medicaid Other | Attending: Emergency Medicine | Admitting: Emergency Medicine

## 2014-09-17 ENCOUNTER — Encounter (HOSPITAL_COMMUNITY): Payer: Self-pay | Admitting: Emergency Medicine

## 2014-09-17 DIAGNOSIS — R1084 Generalized abdominal pain: Secondary | ICD-10-CM | POA: Diagnosis not present

## 2014-09-17 DIAGNOSIS — F329 Major depressive disorder, single episode, unspecified: Secondary | ICD-10-CM | POA: Insufficient documentation

## 2014-09-17 DIAGNOSIS — Z8673 Personal history of transient ischemic attack (TIA), and cerebral infarction without residual deficits: Secondary | ICD-10-CM | POA: Diagnosis not present

## 2014-09-17 DIAGNOSIS — R197 Diarrhea, unspecified: Secondary | ICD-10-CM | POA: Insufficient documentation

## 2014-09-17 DIAGNOSIS — R112 Nausea with vomiting, unspecified: Secondary | ICD-10-CM | POA: Diagnosis not present

## 2014-09-17 DIAGNOSIS — J449 Chronic obstructive pulmonary disease, unspecified: Secondary | ICD-10-CM | POA: Diagnosis not present

## 2014-09-17 DIAGNOSIS — Z79899 Other long term (current) drug therapy: Secondary | ICD-10-CM | POA: Diagnosis not present

## 2014-09-17 DIAGNOSIS — F419 Anxiety disorder, unspecified: Secondary | ICD-10-CM | POA: Diagnosis not present

## 2014-09-17 DIAGNOSIS — Z720 Tobacco use: Secondary | ICD-10-CM | POA: Diagnosis not present

## 2014-09-17 DIAGNOSIS — Z8619 Personal history of other infectious and parasitic diseases: Secondary | ICD-10-CM | POA: Insufficient documentation

## 2014-09-17 NOTE — ED Notes (Signed)
Per EMS , pt. Is from home with complaint of abdominal pain x 3 weeks, also complaint of nausea and vomiting , diarrhea on and off over a week, pt. Has Hep C . Alert and oriented x3. Ambulatory upon arrival to ED.

## 2014-09-17 NOTE — ED Notes (Signed)
Bed: UX32 Expected date:  Expected time:  Means of arrival:  Comments: EMS abd pain x 3 weeks

## 2014-09-18 LAB — CBC WITH DIFFERENTIAL/PLATELET
Basophils Absolute: 0 10*3/uL (ref 0.0–0.1)
Basophils Relative: 1 % (ref 0–1)
EOS ABS: 0.2 10*3/uL (ref 0.0–0.7)
EOS PCT: 3 % (ref 0–5)
HCT: 39.4 % (ref 39.0–52.0)
HEMOGLOBIN: 13.4 g/dL (ref 13.0–17.0)
Lymphocytes Relative: 41 % (ref 12–46)
Lymphs Abs: 2.6 10*3/uL (ref 0.7–4.0)
MCH: 30.9 pg (ref 26.0–34.0)
MCHC: 34 g/dL (ref 30.0–36.0)
MCV: 90.8 fL (ref 78.0–100.0)
MONO ABS: 0.9 10*3/uL (ref 0.1–1.0)
Monocytes Relative: 15 % — ABNORMAL HIGH (ref 3–12)
Neutro Abs: 2.6 10*3/uL (ref 1.7–7.7)
Neutrophils Relative %: 40 % — ABNORMAL LOW (ref 43–77)
Platelets: 169 10*3/uL (ref 150–400)
RBC: 4.34 MIL/uL (ref 4.22–5.81)
RDW: 12.8 % (ref 11.5–15.5)
WBC: 6.3 10*3/uL (ref 4.0–10.5)

## 2014-09-18 LAB — COMPREHENSIVE METABOLIC PANEL
ALT: 62 U/L — ABNORMAL HIGH (ref 0–53)
ANION GAP: 11 (ref 5–15)
AST: 89 U/L — ABNORMAL HIGH (ref 0–37)
Albumin: 3.8 g/dL (ref 3.5–5.2)
Alkaline Phosphatase: 108 U/L (ref 39–117)
BUN: 12 mg/dL (ref 6–23)
CALCIUM: 8.7 mg/dL (ref 8.4–10.5)
CO2: 22 mmol/L (ref 19–32)
Chloride: 100 mEq/L (ref 96–112)
Creatinine, Ser: 0.72 mg/dL (ref 0.50–1.35)
GFR calc Af Amer: >90 mL/min (ref >90)
GFR calc non Af Amer: >90 mL/min (ref >90)
GLUCOSE: 84 mg/dL (ref 70–99)
Potassium: 3.8 mmol/L (ref 3.5–5.1)
SODIUM: 133 mmol/L — AB (ref 135–145)
TOTAL PROTEIN: 7.8 g/dL (ref 6.0–8.3)
Total Bilirubin: 0.7 mg/dL (ref 0.3–1.2)

## 2014-09-18 LAB — LIPASE, BLOOD: Lipase: 30 U/L (ref 11–59)

## 2014-09-18 MED ORDER — MORPHINE SULFATE 4 MG/ML IJ SOLN
4.0000 mg | Freq: Once | INTRAMUSCULAR | Status: AC
  Administered 2014-09-18: 4 mg via INTRAVENOUS
  Filled 2014-09-18: qty 1

## 2014-09-18 MED ORDER — SODIUM CHLORIDE 0.9 % IV BOLUS (SEPSIS)
1000.0000 mL | Freq: Once | INTRAVENOUS | Status: AC
  Administered 2014-09-18: 1000 mL via INTRAVENOUS

## 2014-09-18 MED ORDER — ONDANSETRON HCL 4 MG/2ML IJ SOLN
4.0000 mg | Freq: Once | INTRAMUSCULAR | Status: AC
  Administered 2014-09-18: 4 mg via INTRAVENOUS
  Filled 2014-09-18: qty 2

## 2014-09-18 MED ORDER — HYDROCODONE-ACETAMINOPHEN 5-325 MG PO TABS: 1.0000 | ORAL_TABLET | ORAL | Status: DC | PRN

## 2014-09-18 MED ORDER — ONDANSETRON 4 MG PO TBDP: 4.0000 mg | ORAL_TABLET | Freq: Three times a day (TID) | ORAL | Status: DC | PRN

## 2014-09-18 NOTE — Discharge Instructions (Signed)
Take Vicodin as needed for pain. Take zofran as needed for nausea. Follow up with your doctor for further evaluation. Refer to attached documents for more information.

## 2014-09-18 NOTE — ED Provider Notes (Signed)
CSN: [id number]     Arrival date & time 09/17/14  2340 History   First MD Initiated Contact with Patient 09/17/14 2347     Chief Complaint  Patient presents with  . Abdominal Pain  . Nausea  . Emesis  . Diarrhea     (Consider location/radiation/quality/duration/timing/severity/associated sxs/prior Treatment) HPI Comments: Patient is a 54 year old male with a past medical history of COPD, hepatitis C, polysubstance abuse, anxiety, and depression who presents with abdominal pain for the past 3 weeks. The pain is located in his generalized abdomen and does not radiate. The pain is described as cramping and severe. The pain started gradually and progressively worsened since the onset. No alleviating/aggravating factors. The patient has tried nothing for symptoms without relief. Associated symptoms include NVD for 1 week. Patient denies fever, headache, chest pain, SOB, dysuria, constipation.   Patient is a 54 y.o. male presenting with abdominal pain, vomiting, and diarrhea. The history is provided by the patient. No language interpreter was used.  Abdominal Pain Pain location:  Generalized Pain quality: aching   Pain radiates to:  Does not radiate Pain severity:  Severe Onset quality:  Gradual Duration:  3 weeks Timing:  Constant Progression:  Unchanged Chronicity:  New Context: alcohol use   Context: not diet changes, not eating, not laxative use, not previous surgeries, not recent illness, not recent sexual activity, not sick contacts and not suspicious food intake   Context comment:  Hepatitis C history Relieved by:  Nothing Worsened by:  Nothing tried Ineffective treatments:  None tried Associated symptoms: diarrhea, nausea and vomiting   Associated symptoms: no chest pain, no chills, no dysuria, no fatigue, no fever and no shortness of breath   Diarrhea:    Quality:  Watery   Number of occurrences:  3 daily   Severity:  Moderate   Duration:  1 week   Timing:  Intermittent    Progression:  Unchanged Nausea:    Severity:  Moderate   Onset quality:  Gradual   Duration:  1 week   Timing:  Intermittent   Progression:  Unchanged Vomiting:    Quality:  Stomach contents   Number of occurrences:  Unknown   Severity:  Moderate   Duration:  1 week   Timing:  Intermittent   Progression:  Unchanged Risk factors: alcohol abuse   Risk factors: no aspirin use, not elderly, has not had multiple surgeries, no NSAID use, not obese, not pregnant and no recent hospitalization   Emesis Associated symptoms: abdominal pain and diarrhea   Associated symptoms: no arthralgias and no chills   Diarrhea Associated symptoms: abdominal pain and vomiting   Associated symptoms: no arthralgias, no chills and no fever     Past Medical History  Diagnosis Date  . Anxiety   . Depression   . COPD (chronic obstructive pulmonary disease)   . Seizures     last seizure 2 months ago  . Alcohol abuse   . Cocaine abuse   . Stroke     2009  . Hepatitis C    Past Surgical History  Procedure Laterality Date  . No past surgeries     Family History  Problem Relation Age of Onset  . Cancer Mother   . Stroke Father   . Cancer Father    History  Substance Use Topics  . Smoking status: Current Every Day Smoker -- 0.50 packs/day for 18 years    Types: Cigarettes  . Smokeless tobacco: Never Used  .  Alcohol Use: Yes     Comment: occ    Review of Systems  Constitutional: Negative for fever, chills and fatigue.  HENT: Negative for trouble swallowing.   Eyes: Negative for visual disturbance.  Respiratory: Negative for shortness of breath.   Cardiovascular: Negative for chest pain and palpitations.  Gastrointestinal: Positive for nausea, vomiting, abdominal pain and diarrhea.  Genitourinary: Negative for dysuria and difficulty urinating.  Musculoskeletal: Negative for arthralgias and neck pain.  Skin: Negative for color change.  Neurological: Negative for dizziness and weakness.   Psychiatric/Behavioral: Negative for dysphoric mood.      Allergies  Peanut-containing drug products and Tramadol  Home Medications   Prior to Admission medications   Medication Sig Start Date End Date Taking? Authorizing Provider  famotidine (PEPCID) 20 MG tablet Take 1 tablet (20 mg total) by mouth 2 (two) times daily. 08/24/14  Yes Tatyana A Kirichenko, PA-C  traMADol (ULTRAM) 50 MG tablet Take 50 mg by mouth 3 (three) times daily.   Yes Historical Provider, MD  acetaminophen (TYLENOL) 325 MG tablet Take 2 tablets (650 mg total) by mouth every 6 (six) hours as needed. Patient not taking: Reported on 09/17/2014 05/16/14   Robbie Lis, MD  omeprazole (PRILOSEC) 20 MG capsule Take 1 capsule (20 mg total) by mouth daily. Patient not taking: Reported on 09/17/2014 08/24/14   Tatyana A Kirichenko, PA-C   There were no vitals taken for this visit. Physical Exam  Constitutional: He is oriented to person, place, and time. He appears well-developed and well-nourished. No distress.  HENT:  Head: Normocephalic and atraumatic.  Eyes: Conjunctivae and EOM are normal.  Neck: Normal range of motion.  Cardiovascular: Normal rate and regular rhythm.  Exam reveals no gallop and no friction rub.   No murmur heard. Pulmonary/Chest: Effort normal and breath sounds normal. He has no wheezes. He has no rales. He exhibits no tenderness.  Abdominal: Soft. He exhibits no distension. There is no tenderness. There is no rebound and no guarding.  Musculoskeletal: Normal range of motion.  Neurological: He is alert and oriented to person, place, and time. Coordination normal.  Speech is goal-oriented. Moves limbs without ataxia.   Skin: Skin is warm and dry.  Psychiatric: He has a normal mood and affect. His behavior is normal.  Nursing note and vitals reviewed.   ED Course  Procedures (including critical care time) Labs Review Labs Reviewed  CBC WITH DIFFERENTIAL - Abnormal; Notable for the following:     Neutrophils Relative % 40 (*)    Monocytes Relative 15 (*)    All other components within normal limits  COMPREHENSIVE METABOLIC PANEL  LIPASE, BLOOD    Imaging Review No results found.   EKG Interpretation None      MDM   Final diagnoses:  Generalized abdominal pain    12:04 AM Labs pending. Patient will have fluids, morphine, and zofran for symptoms. Vitals stable and patient afebrile.   1:15 AM Patient reports improvement of symptoms. Labs unremarkable for acute changes. Patient has not vomited or had diarrhea since being in the ED. Patient will be discharged with Vicodin and zofran. Vitals stable and patient afebrile. No further evaluation needed at this time. Patient will follow up with PCP.   Alvina Chou, PA-C 09/18/14 [place], MD 09/19/14 [phone]

## 2014-11-09 ENCOUNTER — Emergency Department (HOSPITAL_COMMUNITY)
Admission: EM | Admit: 2014-11-09 | Discharge: 2014-11-09 | Disposition: A | Discharge status: Home / Self Care | Payer: Medicaid Other | Attending: Emergency Medicine | Admitting: Emergency Medicine

## 2014-11-09 ENCOUNTER — Encounter (HOSPITAL_COMMUNITY): Payer: Self-pay

## 2014-11-09 DIAGNOSIS — R110 Nausea: Secondary | ICD-10-CM | POA: Insufficient documentation

## 2014-11-09 DIAGNOSIS — R569 Unspecified convulsions: Secondary | ICD-10-CM | POA: Diagnosis not present

## 2014-11-09 DIAGNOSIS — Z79899 Other long term (current) drug therapy: Secondary | ICD-10-CM | POA: Diagnosis not present

## 2014-11-09 DIAGNOSIS — X58XXXA Exposure to other specified factors, initial encounter: Secondary | ICD-10-CM | POA: Diagnosis not present

## 2014-11-09 DIAGNOSIS — F419 Anxiety disorder, unspecified: Secondary | ICD-10-CM | POA: Insufficient documentation

## 2014-11-09 DIAGNOSIS — R1010 Upper abdominal pain, unspecified: Secondary | ICD-10-CM | POA: Insufficient documentation

## 2014-11-09 DIAGNOSIS — Y9289 Other specified places as the place of occurrence of the external cause: Secondary | ICD-10-CM | POA: Insufficient documentation

## 2014-11-09 DIAGNOSIS — F329 Major depressive disorder, single episode, unspecified: Secondary | ICD-10-CM | POA: Diagnosis not present

## 2014-11-09 DIAGNOSIS — J449 Chronic obstructive pulmonary disease, unspecified: Secondary | ICD-10-CM | POA: Insufficient documentation

## 2014-11-09 DIAGNOSIS — S60511A Abrasion of right hand, initial encounter: Secondary | ICD-10-CM | POA: Diagnosis not present

## 2014-11-09 DIAGNOSIS — F10920 Alcohol use, unspecified with intoxication, uncomplicated: Secondary | ICD-10-CM

## 2014-11-09 DIAGNOSIS — Z8619 Personal history of other infectious and parasitic diseases: Secondary | ICD-10-CM | POA: Insufficient documentation

## 2014-11-09 DIAGNOSIS — Y998 Other external cause status: Secondary | ICD-10-CM | POA: Insufficient documentation

## 2014-11-09 DIAGNOSIS — Z8673 Personal history of transient ischemic attack (TIA), and cerebral infarction without residual deficits: Secondary | ICD-10-CM | POA: Insufficient documentation

## 2014-11-09 DIAGNOSIS — Y9389 Activity, other specified: Secondary | ICD-10-CM | POA: Diagnosis not present

## 2014-11-09 DIAGNOSIS — F10120 Alcohol abuse with intoxication, uncomplicated: Secondary | ICD-10-CM | POA: Diagnosis not present

## 2014-11-09 DIAGNOSIS — F1010 Alcohol abuse, uncomplicated: Secondary | ICD-10-CM | POA: Diagnosis present

## 2014-11-09 LAB — COMPREHENSIVE METABOLIC PANEL
ALBUMIN: 3.7 g/dL (ref 3.5–5.2)
ALT: 86 U/L — ABNORMAL HIGH (ref 0–53)
AST: 170 U/L — AB (ref 0–37)
Alkaline Phosphatase: 114 U/L (ref 39–117)
Anion gap: 9 (ref 5–15)
BILIRUBIN TOTAL: 0.8 mg/dL (ref 0.3–1.2)
BUN: 9 mg/dL (ref 6–23)
CHLORIDE: 105 mmol/L (ref 96–112)
CO2: 22 mmol/L (ref 19–32)
CREATININE: 0.66 mg/dL (ref 0.50–1.35)
Calcium: 8.2 mg/dL — ABNORMAL LOW (ref 8.4–10.5)
GFR calc Af Amer: >90 mL/min (ref >90)
GFR calc non Af Amer: >90 mL/min (ref >90)
Glucose, Bld: 95 mg/dL (ref 70–99)
Potassium: 3.4 mmol/L — ABNORMAL LOW (ref 3.5–5.1)
Sodium: 136 mmol/L (ref 135–145)
TOTAL PROTEIN: 7.2 g/dL (ref 6.0–8.3)

## 2014-11-09 LAB — CBC WITH DIFFERENTIAL/PLATELET
BASOS PCT: 1 % (ref 0–1)
Basophils Absolute: 0 10*3/uL (ref 0.0–0.1)
EOS ABS: 0.1 10*3/uL (ref 0.0–0.7)
Eosinophils Relative: 2 % (ref 0–5)
HCT: 36.9 % — ABNORMAL LOW (ref 39.0–52.0)
Hemoglobin: 12.3 g/dL — ABNORMAL LOW (ref 13.0–17.0)
Lymphocytes Relative: 35 % (ref 12–46)
Lymphs Abs: 1.6 10*3/uL (ref 0.7–4.0)
MCH: 30.3 pg (ref 26.0–34.0)
MCHC: 33.3 g/dL (ref 30.0–36.0)
MCV: 90.9 fL (ref 78.0–100.0)
MONO ABS: 0.7 10*3/uL (ref 0.1–1.0)
MONOS PCT: 15 % — AB (ref 3–12)
NEUTROS ABS: 2.3 10*3/uL (ref 1.7–7.7)
NEUTROS PCT: 47 % (ref 43–77)
Platelets: 122 10*3/uL — ABNORMAL LOW (ref 150–400)
RBC: 4.06 MIL/uL — ABNORMAL LOW (ref 4.22–5.81)
RDW: 14.1 % (ref 11.5–15.5)
WBC: 4.7 10*3/uL (ref 4.0–10.5)

## 2014-11-09 LAB — RAPID URINE DRUG SCREEN, HOSP PERFORMED
Amphetamines: NOT DETECTED
Barbiturates: NOT DETECTED
Benzodiazepines: NOT DETECTED
Cocaine: POSITIVE — AB
OPIATES: NOT DETECTED
Tetrahydrocannabinol: NOT DETECTED

## 2014-11-09 LAB — ETHANOL: Alcohol, Ethyl (B): 320 mg/dL — ABNORMAL HIGH (ref 0–9)

## 2014-11-09 LAB — CBG MONITORING, ED: GLUCOSE-CAPILLARY: 93 mg/dL (ref 70–99)

## 2014-11-09 MED ORDER — BACITRACIN ZINC 500 UNIT/GM EX OINT
1.0000 "application " | TOPICAL_OINTMENT | Freq: Once | CUTANEOUS | Status: AC
  Administered 2014-11-09: 1 via TOPICAL
  Filled 2014-11-09: qty 0.9

## 2014-11-09 MED ORDER — THIAMINE HCL 100 MG/ML IJ SOLN
100.0000 mg | Freq: Every day | INTRAMUSCULAR | Status: DC
  Administered 2014-11-09: 100 mg via INTRAVENOUS
  Filled 2014-11-09: qty 2

## 2014-11-09 NOTE — ED Provider Notes (Signed)
CSN: [id number]     Arrival date & time 11/09/14  1726 History   First MD Initiated Contact with Patient 11/09/14 1740     Chief Complaint  Patient presents with  . Seizures  . Abdominal discomfort    HPI Pt states he has history of alcohol use but he just drank one beer today.  He has a history of seizures but they don't give him medications for it.  He states he had two seizures today.  He called EMS to be seen in the hospital.  Pt also has trouble with his appetite.  He has nausea and upper abdominal discomfort.  He attributes this to his hepatitis C.  He does not see anyone for treatment.  He called EMS today to come to the ED for evaluation. Past Medical History  Diagnosis Date  . Anxiety   . Depression   . COPD (chronic obstructive pulmonary disease)   . Seizures     last seizure 2 months ago  . Alcohol abuse   . Cocaine abuse   . Stroke     2009  . Hepatitis C    Past Surgical History  Procedure Laterality Date  . No past surgeries     Family History  Problem Relation Age of Onset  . Cancer Mother   . Stroke Father   . Cancer Father    History  Substance Use Topics  . Smoking status: Current Every Day Smoker -- 0.50 packs/day for 18 years    Types: Cigarettes  . Smokeless tobacco: Never Used  . Alcohol Use: Yes     Comment: occ    Review of Systems  All other systems reviewed and are negative.     Allergies  Peanut-containing drug products and Tramadol  Home Medications   Prior to Admission medications   Medication Sig Start Date End Date Taking? Authorizing Provider  HYDROcodone-acetaminophen (NORCO/VICODIN) 5-325 MG per tablet Take 1-2 tablets by mouth every 4 (four) hours as needed for moderate pain or severe pain. 09/18/14  Yes Kaitlyn Szekalski, PA-C  traMADol (ULTRAM) 50 MG tablet Take 50 mg by mouth 3 (three) times daily.   Yes Historical Provider, MD  acetaminophen (TYLENOL) 325 MG tablet Take 2 tablets (650 mg total) by mouth every 6 (six) hours  as needed. Patient not taking: Reported on 09/17/2014 05/16/14   Robbie Lis, MD  famotidine (PEPCID) 20 MG tablet Take 1 tablet (20 mg total) by mouth 2 (two) times daily. Patient not taking: Reported on 11/09/2014 08/24/14   Jeannett Senior, PA-C  omeprazole (PRILOSEC) 20 MG capsule Take 1 capsule (20 mg total) by mouth daily. Patient not taking: Reported on 09/17/2014 08/24/14   Tatyana Kirichenko, PA-C  ondansetron (ZOFRAN ODT) 4 MG disintegrating tablet Take 1 tablet (4 mg total) by mouth every 8 (eight) hours as needed for nausea or vomiting. 09/18/14   Alvina Chou, PA-C   BP 116/77 mmHg  Pulse 79  Temp(Src) 98.3 F (36.8 C) (Oral)  Resp 19  SpO2 93% Physical Exam  Constitutional: He appears well-developed and well-nourished. No distress.  HENT:  Head: Normocephalic and atraumatic.  Right Ear: External ear normal.  Left Ear: External ear normal.  Eyes: Conjunctivae are normal. Right eye exhibits no discharge. Left eye exhibits no discharge. No scleral icterus.  Neck: Neck supple. No tracheal deviation present.  Cardiovascular: Normal rate, regular rhythm and intact distal pulses.   Pulmonary/Chest: Effort normal and breath sounds normal. No stridor. No respiratory distress. He has  no wheezes. He has no rales.  Abdominal: Soft. Bowel sounds are normal. He exhibits no distension. There is no tenderness. There is no rebound and no guarding.  Musculoskeletal: He exhibits no edema or tenderness.  Small abrasion dorsal right hand  Neurological: He is alert. He has normal strength. No cranial nerve deficit (no facial droop, extraocular movements intact, no slurred speech) or sensory deficit. He exhibits normal muscle tone. He displays no seizure activity. Coordination normal.  Skin: Skin is warm and dry. No rash noted.  Psychiatric: He has a normal mood and affect.  Nursing note and vitals reviewed.   ED Course  Procedures (including critical care time) Labs Review Labs  Reviewed  CBC WITH DIFFERENTIAL/PLATELET - Abnormal; Notable for the following:    RBC 4.06 (*)    Hemoglobin 12.3 (*)    HCT 36.9 (*)    Platelets 122 (*)    Monocytes Relative 15 (*)    All other components within normal limits  COMPREHENSIVE METABOLIC PANEL - Abnormal; Notable for the following:    Potassium 3.4 (*)    Calcium 8.2 (*)    AST 170 (*)    ALT 86 (*)    All other components within normal limits  URINE RAPID DRUG SCREEN (HOSP PERFORMED) - Abnormal; Notable for the following:    Cocaine POSITIVE (*)    All other components within normal limits  ETHANOL - Abnormal; Notable for the following:    Alcohol, Ethyl (B) 320 (*)    All other components within normal limits  CBG MONITORING, ED   Medications  thiamine (B-1) injection 100 mg (100 mg Intravenous Given 11/09/14 1801)  bacitracin ointment 1 application (1 application Topical Given 11/09/14 1801)     MDM   Final diagnoses:  Alcohol intoxication, uncomplicated    Pt with acute alcohol intoxication.  I explained to him that degree of drinking could affect his liver.  He states he normally does not drink that much.  No seizure acitivity noted in the ED.  Will have him sober up here until he can safely walk on his own.    Dorie Rank, MD 11/09/14 [phone]

## 2014-11-09 NOTE — Discharge Instructions (Signed)
Alcohol Intoxication °Alcohol intoxication occurs when you drink enough alcohol that it affects your ability to function. It can be mild or very severe. Drinking a lot of alcohol in a short time is called binge drinking. This can be very harmful. Drinking alcohol can also be more dangerous if you are taking medicines or other drugs. Some of the effects caused by alcohol may include: °· Loss of coordination. °· Changes in mood and behavior. °· Unclear thinking. °· Trouble talking (slurred speech). °· Throwing up (vomiting). °· Confusion. °· Slowed breathing. °· Twitching and shaking (seizures). °· Loss of consciousness. °HOME CARE °· Do not drive after drinking alcohol. °· Drink enough water and fluids to keep your pee (urine) clear or pale yellow. Avoid caffeine. °· Only take medicine as told by your doctor. °GET HELP IF: °· You throw up (vomit) many times. °· You do not feel better after a few days. °· You frequently have alcohol intoxication. Your doctor can help decide if you should see a substance use treatment counselor. °GET HELP RIGHT AWAY IF: °· You become shaky when you stop drinking. °· You have twitching and shaking. °· You throw up blood. It may look bright red or like coffee grounds. °· You notice blood in your poop (bowel movements). °· You become lightheaded or pass out (faint). °MAKE SURE YOU:  °· Understand these instructions. °· Will watch your condition. °· Will get help right away if you are not doing well or get worse. °Document Released: 01/31/2008 Document Revised: 04/16/2013 Document Reviewed: 01/17/2013 °Exit[employer]® Patient Information ©2015 [employer]. This information is not intended to replace advice given to you by your health care provider. Make sure you discuss any questions you have with your health care provider. ° °

## 2014-11-09 NOTE — Progress Notes (Signed)
pcp is [facility] [place], [place], [place] [address] :[phone]

## 2014-11-09 NOTE — ED Notes (Signed)
Bed: EP32 Expected date:  Expected time:  Means of arrival:  Comments: m seziure

## 2014-11-09 NOTE — ED Notes (Signed)
Pt requested to get up and use restroom, pt has been provide UA cup and escorted to bathroom

## 2014-11-09 NOTE — ED Notes (Addendum)
Per EMS, Pt, from home, c/o unwitnessed seizure x 2+ hours ago, ETOH intoxication, and "uncomfortable feeling in stomach from Hepatitis C."  Denies pain.  Hx of ETOH abuse, anxiety, depression, and seizures.  Pt is noncompliant w/ all medications.  Family reported to EMS that seizures increase w/ ETOH use.

## 2015-07-04 ENCOUNTER — Encounter (HOSPITAL_COMMUNITY): Payer: Self-pay | Admitting: *Deleted

## 2015-07-04 ENCOUNTER — Emergency Department (HOSPITAL_COMMUNITY)
Admission: EM | Admit: 2015-07-04 | Discharge: 2015-07-05 | Disposition: A | Discharge status: Home / Self Care | Payer: Medicaid Other | Attending: Emergency Medicine | Admitting: Emergency Medicine

## 2015-07-04 DIAGNOSIS — Z720 Tobacco use: Secondary | ICD-10-CM | POA: Diagnosis not present

## 2015-07-04 DIAGNOSIS — F1010 Alcohol abuse, uncomplicated: Secondary | ICD-10-CM | POA: Diagnosis not present

## 2015-07-04 DIAGNOSIS — Z8673 Personal history of transient ischemic attack (TIA), and cerebral infarction without residual deficits: Secondary | ICD-10-CM | POA: Diagnosis not present

## 2015-07-04 DIAGNOSIS — R197 Diarrhea, unspecified: Secondary | ICD-10-CM | POA: Diagnosis not present

## 2015-07-04 DIAGNOSIS — F10129 Alcohol abuse with intoxication, unspecified: Secondary | ICD-10-CM | POA: Diagnosis present

## 2015-07-04 DIAGNOSIS — J449 Chronic obstructive pulmonary disease, unspecified: Secondary | ICD-10-CM | POA: Diagnosis not present

## 2015-07-04 NOTE — ED Notes (Signed)
After speaking with pt he says diagnosis for Hep C was 2 years ago but it is taking him down and he hasn't eaten in 4 days and only had one beer,  Pt smells heavily of alcohol and pupils are pinpoint he denies any drug use, just history of.

## 2015-07-04 NOTE — ED Notes (Signed)
Bed: [facility] Expected date:  Expected time:  Means of arrival:  Comments: EMS 54yo M abd pain / ETOH

## 2015-07-04 NOTE — ED Notes (Signed)
Pt came in via EMS from home,  ETOH on board,  Diarrhea and recent diagnosis of Hep C and he doesn't want to follow up with doctor per EMs

## 2015-07-04 NOTE — ED Provider Notes (Signed)
CSN: [id number]     Arrival date & time 07/04/15  2217 History   First MD Initiated Contact with Patient 07/04/15 2222     Chief Complaint  Patient presents with  . Alcohol Intoxication  . Diarrhea     (Consider location/radiation/quality/duration/timing/severity/associated sxs/prior Treatment) HPI Mario Andrews is a 54 y.o. male history of polysubstance abuse, alcohol abuse, recently diagnosed hepatitis C comes in for evaluation of alcohol intoxication. Patient reportedly was recently diagnosed with hepatitis C, but has not followed up with his doctors due to transportation issues. He denies any new symptoms or complaints today, but states "I have medicaid, can't I just stay here?". He reports drinking "one beer to calm my nerves". Denies any other recreational drug use. No suicidal or homicidal ideations. No auditory or visual hallucinations. No anxiety or agitation. No other aggravating or modifying factors. Past Medical History  Diagnosis Date  . Anxiety   . Depression   . COPD (chronic obstructive pulmonary disease) ([place])   . Seizures ([place])     last seizure 2 months ago  . Alcohol abuse   . Cocaine abuse   . Stroke [facility])     2009  . Hepatitis C    Past Surgical History  Procedure Laterality Date  . No past surgeries     Family History  Problem Relation Age of Onset  . Cancer Mother   . Stroke Father   . Cancer Father    Social History  Substance Use Topics  . Smoking status: Current Every Day Smoker -- 0.50 packs/day for 18 years    Types: Cigarettes  . Smokeless tobacco: Never Used  . Alcohol Use: Yes     Comment: occ    Review of Systems A 10 point review of systems was completed and was negative except for pertinent positives and negatives as mentioned in the history of present illness     Allergies  Peanut-containing drug products and Tramadol  Home Medications   Prior to Admission medications   Not on File   BP 130/71 mmHg  Pulse 88  Temp(Src) 98.2  F (36.8 C) (Oral)  Resp 18  Ht '6\' 1"'$  (1.854 m)  Wt 155 lb (70.308 kg)  BMI 20.45 kg/m2  SpO2 95% Physical Exam  Constitutional: He is oriented to person, place, and time. He appears well-developed and well-nourished.  HENT:  Head: Normocephalic and atraumatic.  Mouth/Throat: Oropharynx is clear and moist.  Eyes: Conjunctivae are normal. Pupils are equal, round, and reactive to light. Right eye exhibits no discharge. Left eye exhibits no discharge. No scleral icterus.  Neck: Neck supple.  Cardiovascular: Normal rate, regular rhythm and normal heart sounds.   Pulmonary/Chest: Effort normal and breath sounds normal. No respiratory distress. He has no wheezes. He has no rales.  Abdominal: Soft. There is no tenderness.  Musculoskeletal: He exhibits no tenderness.  Neurological: He is alert and oriented to person, place, and time.  Cranial Nerves II-XII grossly intact. Moves all extremities without ataxia. Motor strength and sensation are baseline for patient. No tremulousness.  Skin: Skin is warm and dry. No rash noted.  Psychiatric: He has a normal mood and affect.  No agitation or anxiety  Nursing note and vitals reviewed.   ED Course  Procedures (including critical care time) Labs Review Labs Reviewed - No data to display  Imaging Review No results found. I have personally reviewed and evaluated these images and lab results as part of my medical decision-making.   EKG Interpretation  None     Filed Vitals:   07/04/15 2221  BP: 130/71  Pulse: 88  Temp: 98.2 F (36.8 C)  TempSrc: Oral  Resp: 18  Height: '6\' 1"'$  (1.854 m)  Weight: 155 lb (70.308 kg)  SpO2: 95%    MDM  Mario Andrews is a 54 y.o. male history of alcohol and polysubstance abuse presents to the ED for current alcohol intoxication. Patient is clinically cleared this time. Patient resting comfortably in the ED. He has been ambulating throughout the ED without difficulty and speaking in clear, goal oriented  sentences. Discussed with him he will need follow-up with his doctors for further evaluation and management of his hepatitis. Return to ED for any new or worsening symptoms. Patient agrees with this plan. Overall, appears well, nontoxic with normal vital signs and is appropriate for discharge.  Final diagnoses:  Alcohol abuse      Comer Locket, PA-C 07/05/15 [place], MD 07/06/15 1724

## 2015-07-05 NOTE — Discharge Instructions (Signed)
It is important to follow up with your primary care doctor/GI doctor for further evaluation and management of your symptoms. It is also important to avoid alcohol as this can worsen your liver condition. Return to ED for any new or worsening symptoms.  Alcohol Abuse and Nutrition Alcohol abuse is any pattern of alcohol consumption that harms your health, relationships, or work. Alcohol abuse can affect how your body breaks down and absorbs nutrients from food by causing your liver to work abnormally. Additionally, many people who abuse alcohol do not eat enough carbohydrates, protein, fat, vitamins, and minerals. This can cause poor nutrition (malnutrition) and a lack of nutrients (nutrient deficiencies), which can lead to further complications. Nutrients that are commonly lacking (deficient) among people who abuse alcohol include:  Vitamins.  Vitamin A. This is stored in your liver. It is important for your vision, metabolism, and ability to fight off infections (immunity).  B vitamins. These include vitamins such as folate, thiamin, and niacin. These are important in new cell growth and maintenance.  Vitamin C. This plays an important role in iron absorption, wound healing, and immunity.  Vitamin D. This is produced by your liver, but you can also get vitamin D from food. Vitamin D is necessary for your body to absorb and use calcium.  Minerals.  Calcium. This is important for your bones and your heart and blood vessel (cardiovascular) function.  Iron. This is important for blood, muscle, and nervous system functioning.  Magnesium. This plays an important role in muscle and nerve function, and it helps to control blood sugar and blood pressure.  Zinc. This is important for the normal function of your nervous system and digestive system (gastrointestinal tract). Nutrition is an essential component of therapy for alcohol abuse. Your health care provider or dietitian will work with you to  design a plan that can help restore nutrients to your body and prevent potential complications. WHAT IS MY PLAN? Your dietitian may develop a specific diet plan that is based on your condition and any other complications you may have. A diet plan will commonly include:  A balanced diet.  Grains: 6-8 oz per day.  Vegetables: 2-3 cups per day.  Fruits: 1-2 cups per day.  Meat and other protein: 5-6 oz per day.  Dairy: 2-3 cups per day.  Vitamin and mineral supplements. WHAT DO I NEED TO KNOW ABOUT ALCOHOL AND NUTRITION?  Consume foods that are high in antioxidants, such as grapes, berries, nuts, green tea, and dark green and orange vegetables. This can help to counteract some of the stress that is placed on your liver by consuming alcohol.  Avoid food and drinks that are high in fat and sugar. Foods such as sugared soft drinks, salty snack foods, and candy contain empty calories. This means that they lack important nutrients such as protein, fiber, and vitamins.  Eat frequent meals and snacks. Try to eat 5-6 small meals each day.  Eat a variety of fresh fruits and vegetables each day. This will help you get plenty of water, fiber, and vitamins in your diet.  Drink plenty of water and other clear fluids. Try to drink at least 48-64 oz (1.5-2 L) of water per day.  If you are a vegetarian, eat a variety of protein-rich foods. Pair whole grains with plant-based proteins at meals and snacks to obtain the greatest nutrient benefit from your food. For example, eat rice with beans, put peanut butter on whole-grain toast, or eat oatmeal with sunflower seeds.  Soak beans and whole grains overnight before cooking. This can help your body to absorb the nutrients more easily.  Include foods fortified with vitamins and minerals in your diet. Commonly fortified foods include milk, orange juice, cereal, and bread.  If you are malnourished, your dietitian may recommend a high-protein, high-calorie  diet. This may include:  2,000-3,000 calories (kilocalories) per day.  70-100 grams of protein per day.  Your health care provider may recommend a complete nutritional supplement beverage. This can help to restore calories, protein, and vitamins to your body. Depending on your condition, you may be advised to consume this instead of or in addition to meals.  Limit your intake of caffeine. Replace drinks like coffee and black tea with decaffeinated coffee and herbal tea.  Eat a variety of foods that are high in omega fatty acids. These include fish, nuts and seeds, and soybeans. These foods may help your liver to recover and may also stabilize your mood.  Certain medicines may cause changes in your appetite, taste, and weight. Work with your health care provider and dietitian to make any adjustments to your medicines and diet plan.  Include other healthy lifestyle choices in your daily routine.  Be physically active.  Get enough sleep.  Spend time doing activities that you enjoy.  If you are unable to take in enough food and calories by mouth, your health care provider may recommend a feeding tube. This is a tube that passes through your nose and throat, directly into your stomach. Nutritional supplement beverages can be given to you through the feeding tube to help you get the nutrients you need.  Take vitamin or mineral supplements as recommended by your health care provider. WHAT FOODS CAN I EAT? Grains Enriched pasta. Enriched rice. Fortified whole-grain bread. Fortified whole-grain cereal. Barley. Brown rice. Quinoa. [place]. Vegetables All fresh, frozen, and canned vegetables. Spinach. Kale. Artichoke. Carrots. Winter squash and pumpkin. Sweet potatoes. Broccoli. Cabbage. Cucumbers. Tomatoes. Sweet peppers. Green beans. Peas. Corn. Fruits All fresh and frozen fruits. Berries. Grapes. Mango. Papaya. Guava. Cherries. Apples. Bananas. Peaches. Plums. Pineapple. Watermelon. Cantaloupe.  Oranges. Avocado. Meats and Other Protein Sources Beef liver. Lean beef. Pork. Fresh and canned chicken. Fresh fish. Oysters. Sardines. Canned tuna. Shrimp. Eggs with yolks. Nuts and seeds. Peanut butter. Beans and lentils. Soybeans. Tofu. Dairy Whole, low-fat, and nonfat milk. Whole, low-fat, and nonfat yogurt. Cottage cheese. Sour cream. Hard and soft cheeses. Beverages Water. Herbal tea. Decaffeinated coffee. Decaffeinated green tea. 100% fruit juice. 100% vegetable juice. Instant breakfast shakes. Condiments Ketchup. Mayonnaise. Mustard. Salad dressing. Barbecue sauce. Sweets and Desserts Sugar-free ice cream. Sugar-free pudding. Sugar-free gelatin. Fats and Oils Butter. Vegetable oil, flaxseed oil, olive oil, and walnut oil. Other Complete nutrition shakes. Protein bars. Sugar-free gum. The items listed above may not be a complete list of recommended foods or beverages. Contact your dietitian for more options. WHAT FOODS ARE NOT RECOMMENDED? Grains Sugar-sweetened breakfast cereals. Flavored instant oatmeal. Fried breads. Vegetables Breaded or deep-fried vegetables. Fruits Dried fruit with added sugar. Candied fruit. Canned fruit in syrup. Meats and Other Protein Sources Breaded or deep-fried meats. Dairy Flavored milks. Fried cheese curds or fried cheese sticks. Beverages Alcohol. Sugar-sweetened soft drinks. Sugar-sweetened tea. Caffeinated coffee and tea. Condiments Sugar. Honey. Agave nectar. Molasses. Sweets and Desserts Chocolate. Cake. Cookies. Candy. Other Potato chips. Pretzels. Salted nuts. Candied nuts. The items listed above may not be a complete list of foods and beverages to avoid. Contact your dietitian for more information.  This information is not intended to replace advice given to you by your health care provider. Make sure you discuss any questions you have with your health care provider.   Document Released: 06/08/2005 Document Revised: 09/04/2014  Document Reviewed: 03/17/2014 Elsevier Interactive Patient Education [employer].

## 2015-07-05 NOTE — ED Notes (Signed)
Pt given blue bird and yellow cab numbers per his request

## 2016-01-21 ENCOUNTER — Encounter (HOSPITAL_COMMUNITY): Payer: Self-pay | Admitting: *Deleted

## 2016-01-21 ENCOUNTER — Emergency Department (HOSPITAL_COMMUNITY)
Admission: EM | Admit: 2016-01-21 | Discharge: 2016-01-22 | Disposition: A | Discharge status: Home / Self Care | Payer: Medicaid Other | Attending: Emergency Medicine | Admitting: Emergency Medicine

## 2016-01-21 DIAGNOSIS — Z91010 Allergy to peanuts: Secondary | ICD-10-CM | POA: Diagnosis not present

## 2016-01-21 DIAGNOSIS — F418 Other specified anxiety disorders: Secondary | ICD-10-CM | POA: Diagnosis not present

## 2016-01-21 DIAGNOSIS — F17210 Nicotine dependence, cigarettes, uncomplicated: Secondary | ICD-10-CM | POA: Diagnosis not present

## 2016-01-21 DIAGNOSIS — R1084 Generalized abdominal pain: Secondary | ICD-10-CM

## 2016-01-21 DIAGNOSIS — F1010 Alcohol abuse, uncomplicated: Secondary | ICD-10-CM

## 2016-01-21 DIAGNOSIS — R1032 Left lower quadrant pain: Secondary | ICD-10-CM | POA: Diagnosis present

## 2016-01-21 DIAGNOSIS — J449 Chronic obstructive pulmonary disease, unspecified: Secondary | ICD-10-CM | POA: Diagnosis not present

## 2016-01-21 LAB — URINALYSIS, ROUTINE W REFLEX MICROSCOPIC
Bilirubin Urine: NEGATIVE
GLUCOSE, UA: NEGATIVE mg/dL
Hgb urine dipstick: NEGATIVE
KETONES UR: NEGATIVE mg/dL
LEUKOCYTES UA: NEGATIVE
NITRITE: NEGATIVE
Protein, ur: NEGATIVE mg/dL
Specific Gravity, Urine: 1.007 (ref 1.005–1.030)
pH: 6 (ref 5.0–8.0)

## 2016-01-21 LAB — COMPREHENSIVE METABOLIC PANEL
ALK PHOS: 165 U/L — AB (ref 38–126)
ALT: 92 U/L — ABNORMAL HIGH (ref 17–63)
AST: 178 U/L — ABNORMAL HIGH (ref 15–41)
Albumin: 3.4 g/dL — ABNORMAL LOW (ref 3.5–5.0)
Anion gap: 10 (ref 5–15)
BUN: 8 mg/dL (ref 6–20)
CALCIUM: 8.5 mg/dL — AB (ref 8.9–10.3)
CO2: 20 mmol/L — ABNORMAL LOW (ref 22–32)
Chloride: 106 mmol/L (ref 101–111)
Creatinine, Ser: 0.67 mg/dL (ref 0.61–1.24)
GLUCOSE: 95 mg/dL (ref 65–99)
POTASSIUM: 3.7 mmol/L (ref 3.5–5.1)
Sodium: 136 mmol/L (ref 135–145)
TOTAL PROTEIN: 7.3 g/dL (ref 6.5–8.1)
Total Bilirubin: 0.7 mg/dL (ref 0.3–1.2)

## 2016-01-21 LAB — CBC
HEMATOCRIT: 39.3 % (ref 39.0–52.0)
HEMOGLOBIN: 13.3 g/dL (ref 13.0–17.0)
MCH: 29.9 pg (ref 26.0–34.0)
MCHC: 33.8 g/dL (ref 30.0–36.0)
MCV: 88.3 fL (ref 78.0–100.0)
Platelets: 147 10*3/uL — ABNORMAL LOW (ref 150–400)
RBC: 4.45 MIL/uL (ref 4.22–5.81)
RDW: 13.4 % (ref 11.5–15.5)
WBC: 5.8 10*3/uL (ref 4.0–10.5)

## 2016-01-21 LAB — LIPASE, BLOOD: Lipase: 25 U/L (ref 11–51)

## 2016-01-21 LAB — ETHANOL: Alcohol, Ethyl (B): 365 mg/dL (ref <5)

## 2016-01-21 MED ORDER — ONDANSETRON 4 MG PO TBDP
8.0000 mg | ORAL_TABLET | Freq: Once | ORAL | Status: AC
  Administered 2016-01-21: 8 mg via ORAL
  Filled 2016-01-21: qty 2

## 2016-01-21 NOTE — ED Notes (Signed)
The pt arrived by ems amb to triage .  C/o abd pain for one year.  Hx hep c

## 2016-01-21 NOTE — ED Notes (Signed)
The pt is an alcoholic and he has had alcohol today

## 2016-01-21 NOTE — ED Provider Notes (Signed)
CSN: [id number]     Arrival date & time 01/21/16  1916 History  By signing my name below, I, Emmanuella Mensah, attest that this documentation has been prepared under the direction and in the presence of Orpah Greek, MD. Electronically Signed: Judithann Sauger, ED Scribe. 01/21/2016. 11:14 PM.    Chief Complaint  Patient presents with  . Abdominal Pain   Patient is a 55 y.o. male presenting with abdominal pain. The history is provided by the patient. No language interpreter was used.  Abdominal Pain Pain location:  LLQ and LUQ Pain radiates to:  Does not radiate Pain severity:  Moderate Onset quality:  Gradual Duration:  4 weeks Timing:  Constant Progression:  Worsening Context: alcohol use   Relieved by:  None tried Worsened by:  Nothing tried Ineffective treatments:  None tried Associated symptoms: vomiting   Associated symptoms: no diarrhea and no fever    HPI Comments: Mario Andrews is a 55 y.o. male with a hx of alcohol abuse and Hepatitis C who presents to the Emergency Department complaining of gradually worsening left-sided abdominal pain onset one month ago. He reports associated multiple episodes of non-bloody vomiting. He states that he drank multiple alcoholic beverages PTA. No alleviating factors noted. Pt has not tried any medications PTA. He states that he has a hx of hepatitis C but does not have a PCP following him with it. He denies any fever or diarrhea.   Past Medical History  Diagnosis Date  . Anxiety   . Depression   . COPD (chronic obstructive pulmonary disease) ([place])   . Seizures ([place])     last seizure 2 months ago  . Alcohol abuse   . Cocaine abuse   . Stroke [facility])     2009  . Hepatitis C    Past Surgical History  Procedure Laterality Date  . No past surgeries     Family History  Problem Relation Age of Onset  . Cancer Mother   . Stroke Father   . Cancer Father    Social History  Substance Use Topics  . Smoking status: Current  Every Day Smoker -- 0.50 packs/day for 18 years    Types: Cigarettes  . Smokeless tobacco: Never Used  . Alcohol Use: Yes     Comment: occ    Review of Systems  Constitutional: Negative for fever.  Gastrointestinal: Positive for vomiting and abdominal pain. Negative for diarrhea.  All other systems reviewed and are negative.     Allergies  Peanut-containing drug products and Tramadol  Home Medications   Prior to Admission medications   Not on File   BP 108/72 mmHg  Pulse 77  Temp(Src) 98.2 F (36.8 C)  Resp 16  Ht '6\' 1"'$  (1.854 m)  Wt 147 lb 3 oz (66.764 kg)  BMI 19.42 kg/m2  SpO2 99% Physical Exam  Constitutional: He is oriented to person, place, and time. He appears well-developed and well-nourished. No distress.  HENT:  Head: Normocephalic and atraumatic.  Right Ear: Hearing normal.  Left Ear: Hearing normal.  Nose: Nose normal.  Mouth/Throat: Oropharynx is clear and moist and mucous membranes are normal.  Eyes: Conjunctivae and EOM are normal. Pupils are equal, round, and reactive to light.  Neck: Normal range of motion. Neck supple.  Cardiovascular: Regular rhythm, S1 normal and S2 normal.  Exam reveals no gallop and no friction rub.   No murmur heard. Pulmonary/Chest: Effort normal and breath sounds normal. No respiratory distress. He exhibits no tenderness.  Abdominal: Soft. Normal appearance and bowel sounds are normal. There is no hepatosplenomegaly. There is no tenderness. There is no rebound, no guarding, no tenderness at McBurney's point and negative Murphy's sign. No hernia.  Musculoskeletal: Normal range of motion.  Neurological: He is alert and oriented to person, place, and time. He has normal strength. No cranial nerve deficit or sensory deficit. Coordination normal. GCS eye subscore is 4. GCS verbal subscore is 5. GCS motor subscore is 6.  Skin: Skin is warm, dry and intact. No rash noted. No cyanosis.  Psychiatric: He has a normal mood and affect.  His speech is normal and behavior is normal. Thought content normal.  Nursing note and vitals reviewed.   ED Course  Procedures (including critical care time) DIAGNOSTIC STUDIES: Oxygen Saturation is 99% on RA, normal by my interpretation.    COORDINATION OF CARE: 11:12 PM- Pt advised of plan for treatment and pt agrees. Pt informed of lab results. He will receive Zofran.    Labs Review Labs Reviewed  COMPREHENSIVE METABOLIC PANEL - Abnormal; Notable for the following:    CO2 20 (*)    Calcium 8.5 (*)    Albumin 3.4 (*)    AST 178 (*)    ALT 92 (*)    Alkaline Phosphatase 165 (*)    All other components within normal limits  CBC - Abnormal; Notable for the following:    Platelets 147 (*)    All other components within normal limits  ETHANOL - Abnormal; Notable for the following:    Alcohol, Ethyl (B) 365 (*)    All other components within normal limits  LIPASE, BLOOD  URINALYSIS, ROUTINE W REFLEX MICROSCOPIC (NOT AT [facility])    Imaging Review No results found.   Orpah Greek, MD has personally reviewed and evaluated these images and lab results as part of his medical decision-making.   EKG Interpretation None      MDM   Final diagnoses:  Generalized abdominal pain  Alcohol abuse    Presents to the emergency department for evaluation of abdominal pain. Patient reports that he has been experiencing symptoms for at least a month. He told the nurse that has been on going for 1 year. He reports a previous history of hepatitis C, does not currently have a primary care doctor or hepatologist. Patient has ongoing alcohol abuse issues. Abdominal exam is benign. No distention or concern for ascites. He does not have any signs of peritonitis. Patient is afebrile. Lab work is unremarkable other than markedly elevated alcohol level. Patient counseled that he needs to find help with his alcoholism. He'll be allowed to sober up and then will be discharged with resources for  alcohol abuse.  I personally performed the services described in this documentation, which was scribed in my presence. The recorded information has been reviewed and is accurate.   Orpah Greek, MD 01/21/16 [phone]

## 2016-01-22 MED ORDER — RANITIDINE HCL 150 MG PO TABS: 150.0000 mg | ORAL_TABLET | Freq: Two times a day (BID) | ORAL | Status: DC

## 2016-01-22 NOTE — Discharge Instructions (Signed)
Alcohol Use Disorder °Alcohol use disorder is a mental disorder. It is not a one-time incident of heavy drinking. Alcohol use disorder is the excessive and uncontrollable use of alcohol over time that leads to problems with functioning in one or more areas of daily living. People with this disorder risk harming themselves and others when they drink to excess. Alcohol use disorder also can cause other mental disorders, such as mood and anxiety disorders, and serious physical problems. People with alcohol use disorder often misuse other drugs.  °Alcohol use disorder is common and widespread. Some people with this disorder drink alcohol to cope with or escape from negative life events. Others drink to relieve chronic pain or symptoms of mental illness. People with a family history of alcohol use disorder are at higher risk of losing control and using alcohol to excess.  °Drinking too much alcohol can cause injury, accidents, and health problems. One drink can be too much when you are: °· Working. °· Pregnant or breastfeeding. °· Taking medicines. Ask your doctor. °· Driving or planning to drive. °SYMPTOMS  °Signs and symptoms of alcohol use disorder may include the following:  °· Consumption of alcohol in larger amounts or over a longer period of time than intended. °· Multiple unsuccessful attempts to cut down or control alcohol use.   °· A great deal of time spent obtaining alcohol, using alcohol, or recovering from the effects of alcohol (hangover). °· A strong desire or urge to use alcohol (cravings).   °· Continued use of alcohol despite problems at work, school, or home because of alcohol use.   °· Continued use of alcohol despite problems in relationships because of alcohol use. °· Continued use of alcohol in situations when it is physically hazardous, such as driving a car. °· Continued use of alcohol despite awareness of a physical or psychological problem that is likely related to alcohol use. Physical  problems related to alcohol use can involve the brain, heart, liver, stomach, and intestines. Psychological problems related to alcohol use include intoxication, depression, anxiety, psychosis, delirium, and dementia.   °· The need for increased amounts of alcohol to achieve the same desired effect, or a decreased effect from the consumption of the same amount of alcohol (tolerance). °· Withdrawal symptoms upon reducing or stopping alcohol use, or alcohol use to reduce or avoid withdrawal symptoms. Withdrawal symptoms include: °¨ Racing heart. °¨ Hand tremor. °¨ Difficulty sleeping. °¨ Nausea. °¨ Vomiting. °¨ Hallucinations. °¨ Restlessness. °¨ Seizures. °DIAGNOSIS °Alcohol use disorder is diagnosed through an assessment by your health care provider. Your health care provider may start by asking three or four questions to screen for excessive or problematic alcohol use. To confirm a diagnosis of alcohol use disorder, at least two symptoms must be present within a 12-month period. The severity of alcohol use disorder depends on the number of symptoms: °· Mild--two or three. °· Moderate--four or five. °· Severe--six or more. °Your health care provider may perform a physical exam or use results from lab tests to see if you have physical problems resulting from alcohol use. Your health care provider may refer you to a mental health professional for evaluation. °TREATMENT  °Some people with alcohol use disorder are able to reduce their alcohol use to low-risk levels. Some people with alcohol use disorder need to quit drinking alcohol. When necessary, mental health professionals with specialized training in substance use treatment can help. Your health care provider can help you decide how severe your alcohol use disorder is and what type of treatment you need.   The following forms of treatment are available:  °· Detoxification. Detoxification involves the use of prescription medicines to prevent alcohol withdrawal  symptoms in the first week after quitting. This is important for people with a history of symptoms of withdrawal and for heavy drinkers who are likely to have withdrawal symptoms. Alcohol withdrawal can be dangerous and, in severe cases, cause death. Detoxification is usually provided in a hospital or in-patient substance use treatment facility. °· Counseling or talk therapy. Talk therapy is provided by substance use treatment counselors. It addresses the reasons people use alcohol and ways to keep them from drinking again. The goals of talk therapy are to help people with alcohol use disorder find healthy activities and ways to cope with life stress, to identify and avoid triggers for alcohol use, and to handle cravings, which can cause relapse. °· Medicines. Different medicines can help treat alcohol use disorder through the following actions: °¨ Decrease alcohol cravings. °¨ Decrease the positive reward response felt from alcohol use. °¨ Produce an uncomfortable physical reaction when alcohol is used (aversion therapy). °· Support groups. Support groups are run by people who have quit drinking. They provide emotional support, advice, and guidance. °These forms of treatment are often combined. Some people with alcohol use disorder benefit from intensive combination treatment provided by specialized substance use treatment centers. Both inpatient and outpatient treatment programs are available. °  °This information is not intended to replace advice given to you by your health care provider. Make sure you discuss any questions you have with your health care provider. °  °Document Released: 09/21/2004 Document Revised: 09/04/2014 Document Reviewed: 11/21/2012 °Elsevier Interactive Patient Education ©2016 [employer] °Substance Abuse Treatment Programs ° °Intensive Outpatient Programs °[facility]     °[address]      °[place], [place]                   °336-878-6098      ° °[facility] °[address] °[place], [place] °336-379-7146 ° °[facility]     °(Inpatient and outpatient)     °[address]           °336-832-9800   ° °[facility] °336-288-1484 (Suboxone and Methadone) ° °[address]      °[place], [place] [address]      °336-882-2125      ° °[address] °[place], [place] °852-3033 ° °[facility] (Outpatient/Inpatient, [facility])    °(insurance only) [phone]      °       °[facility] (Groups & Residential) °[place], [place] °336-389-1413 ° °   °[facility]     °[address]     °[place], [place]      °336-389-1413      ° °[facility] (for caregivers and family) °[address] °[place], [place] °336-299-4655 ° ° ° ° ° °Residential Treatment Programs °[facility]      °[address], [place],  [address]  °(336) [phone]      ° °[facility] °[address] [place],  [address] °919-419-1059 ° °[facility]        °336-248-8914      ° °[facility] °1-800-659-3381 ° °[facility]             °[address]                                         °  [place], [place]                                                °877-615-2722 or 336-784-9470                              ° °[facility] [place] °[address] °[place], [address] °1.877.941.8954 ° °[facility]    °[address]      °[place], [place]     °[phone]      ° °[facility] [place] [facility] °[address] °[place], [place] °336-285-9073 ° °[facility]   °[address]     °[place], [place] [address]     °336-899-1550      °Admissions: 8am-3pm M-F ° °[facility] °[address] °[place], [place] °336-227-7417 ° °[facility]: Residential Program (90 Days)   °[place], [place]      °336-725-8389 or 800-758-6077    ° °[facility] °[place], [place] °(Walk in Hours over the weekend or by  referral) ° °[facility] °[address], [place], [place] [address] °(336) [phone] ° °Crisis Mobile: [facility]:  1-877-626-1772 (for crisis response 24 hours a day) °[facility] Hotline:      1-800-256-2452 °Outpatient Psychiatry and Counseling ° °[facility]: Mobile Crisis Management 24 hours:  1-877-626-1772 ° °[facility] sliding scale fee and walk in schedule: M-F 8am-12pm/1pm-3pm °[address]  °[place], [place] [address] °336-387-6161 ° °[facility] °[address] °[place], [place] [address] °336-703-9650 ° °[facility] ([facility] known [facility])- new patient walk-in appointments available Monday - Friday 8am -3pm.          °[address] °[place], [place] [address] °336-676-6840 or crisis line- 336-676-6905 ° °[facility] Intensive Outpatient Therapy Program °[address] °[place], [place] [address] °336-832-9804 ° °[facility]                  °Crisis Services      °336.641.4993      °[address]     °[place], [place] [address]                ° °[facility]   °[facility] °[phone] °[address] °[place], [place] [address] ° ° °[facility]          °[address],  °[place], [place] [address]       °(336) [phone] ° °[facility] °[address] °[place], [place] [address] °336-292-1510 ° °[facility]    °[address]    °[place], [place] [address]     °336-632-3505      ° °Parish McKinney, MD     °[address]     °[place] [address]     °336-282-1251     °  °[facility] °[address] °[place] [address] ° °[facility]     °[address]     °[place], [place]      °336-542-2076      ° °[facility] °Shamsher Ahluwalia, MD °[address] °  [place], [place]  [address] °336-420-9558 ° °[facility]     °[address]     °[place], [place] [address]     °336-274-1237      ° °[facility] °[address] °[place], [place] [address] °336-854-4450 °Resources for Temporary Residential Assistance/Crisis Centers ° °DAY CENTERS °[facility] °M-F 8am-3pm   °[address] [place], [place] [address]   336-332-0824 °Services include: laundry, barbering, support groups, case management, phone  & computer access, showers, AA/NA mtgs, mental health/substance abuse nurse, job skills class, disability information, VA assistance, spiritual classes, etc.  ° °HOMELESS SHELTERS ° °[facility]     °[facility]   °[address], [place]     °336.271.5959       °       °[facility] (women and children)       °[address] °[place], [place] [address] °336-275-0820 °[email or web address] for application and process °Application Required ° °[facility]   °[address]    °[place] [address]     °336.886.4922       °             °[facility] °[address] °[place], [place] [address] °336.273.5572 °336-235-0363(schedule application appt.) °Application Required ° °[facility] (women only)    °[address]     °[place], [place] [address]     °336-884-1039      °Intake starts 6pm daily °Need valid ID, SSC, & Police report °[facility] [place] °[address] °[place], [place] °336-881-5420 °Application Required ° °[facility] (men only)     °[address]      °[place], [place]     °[phone]      ° °[facility] °(Pregnant women only) °[address] °[place], [place] °336-275-0206 ° °[facility]      °[address]      °[place], [place] [address]     °336-722-9951      °       °[facility] °[address] °[place], [place] °336-723-1848 °90 day commitment/SA/Application process ° °[facility] only[facility]     °[address]     °[place], [place]     °336-748-1962       °Check-in at 7pm     °       °[facility] °[address] °[place], [place] [address] °336-248-6684 °Men/Women/Women and Children must be there by 7 pm ° °[facility] °[place],  °336-722-8721                ° °

## 2016-03-17 ENCOUNTER — Encounter (HOSPITAL_COMMUNITY): Payer: Self-pay | Admitting: *Deleted

## 2016-03-17 ENCOUNTER — Emergency Department (HOSPITAL_COMMUNITY)
Admission: EM | Admit: 2016-03-17 | Discharge: 2016-03-17 | Disposition: A | Discharge status: Home / Self Care | Payer: Medicaid Other | Attending: Emergency Medicine | Admitting: Emergency Medicine

## 2016-03-17 DIAGNOSIS — J449 Chronic obstructive pulmonary disease, unspecified: Secondary | ICD-10-CM | POA: Insufficient documentation

## 2016-03-17 DIAGNOSIS — F17210 Nicotine dependence, cigarettes, uncomplicated: Secondary | ICD-10-CM | POA: Insufficient documentation

## 2016-03-17 DIAGNOSIS — F1410 Cocaine abuse, uncomplicated: Secondary | ICD-10-CM | POA: Diagnosis not present

## 2016-03-17 DIAGNOSIS — F10120 Alcohol abuse with intoxication, uncomplicated: Secondary | ICD-10-CM | POA: Insufficient documentation

## 2016-03-17 DIAGNOSIS — F329 Major depressive disorder, single episode, unspecified: Secondary | ICD-10-CM | POA: Diagnosis not present

## 2016-03-17 DIAGNOSIS — F10929 Alcohol use, unspecified with intoxication, unspecified: Secondary | ICD-10-CM

## 2016-03-17 DIAGNOSIS — Z8673 Personal history of transient ischemic attack (TIA), and cerebral infarction without residual deficits: Secondary | ICD-10-CM | POA: Diagnosis not present

## 2016-03-17 MED ORDER — POTASSIUM CHLORIDE CRYS ER 20 MEQ PO TBCR
40.0000 meq | EXTENDED_RELEASE_TABLET | Freq: Once | ORAL | Status: AC
  Administered 2016-03-17: 40 meq via ORAL
  Filled 2016-03-17: qty 2

## 2016-03-17 NOTE — ED Notes (Signed)
Pt arrives to the ER via [employer]; [employer] was called to the house by the sister because pt had been drinking and getting loud; pt states that he needs help with his ETOH; pt denies SI / HI; pt states that he has Hepatitis C and states "my PCP told me, Beatle you don't have long before the light"; pt states that he drinks 2 quarts a day; pt states "[employer] I drink, I am going to die from Hep C

## 2016-03-17 NOTE — ED Provider Notes (Signed)
CSN: [id number]     Arrival date & time 03/17/16  0431 History   First MD Initiated Contact with Patient 03/17/16 0535     Chief Complaint  Patient presents with  . Alcohol Problem     (Consider location/radiation/quality/duration/timing/severity/associated sxs/prior Treatment) Patient is a 55 y.o. male presenting with alcohol problem. The history is provided by the patient.  Alcohol Problem This is a chronic problem. The current episode started 6 to 12 hours ago. The problem occurs constantly. The problem has not changed since onset.Pertinent negatives include no chest pain, no abdominal pain, no headaches and no shortness of breath. Nothing aggravates the symptoms. Nothing relieves the symptoms. He has tried nothing for the symptoms. The treatment provided no relief.   55 yo M Who is transferred to the ED because he was being loud after drinking. The patient states he has had some leg cramping but currently has no symptoms. Denies any chest pain abdominal pain fevers chills.  Past Medical History  Diagnosis Date  . Anxiety   . Depression   . COPD (chronic obstructive pulmonary disease) ([place])   . Seizures ([place])     last seizure 2 months ago  . Alcohol abuse   . Cocaine abuse   . Stroke [facility])     2009  . Hepatitis C    Past Surgical History  Procedure Laterality Date  . No past surgeries     Family History  Problem Relation Age of Onset  . Cancer Mother   . Stroke Father   . Cancer Father    Social History  Substance Use Topics  . Smoking status: Current Every Day Smoker -- 0.50 packs/day for 18 years    Types: Cigarettes  . Smokeless tobacco: Never Used  . Alcohol Use: Yes     Comment: pt states 2 quarts daily    Review of Systems  Constitutional: Negative for fever and chills.  HENT: Negative for congestion and facial swelling.   Eyes: Negative for discharge and visual disturbance.  Respiratory: Negative for shortness of breath.   Cardiovascular: Negative for  chest pain and palpitations.  Gastrointestinal: Negative for vomiting, abdominal pain and diarrhea.  Musculoskeletal: Negative for myalgias and arthralgias.  Skin: Negative for color change and rash.  Neurological: Negative for tremors, syncope and headaches.  Psychiatric/Behavioral: Negative for confusion and dysphoric mood.      Allergies  Peanut-containing drug products and Tramadol  Home Medications   Prior to Admission medications   Medication Sig Start Date End Date Taking? Authorizing Provider  ranitidine (ZANTAC) 150 MG tablet Take 1 tablet (150 mg total) by mouth 2 (two) times daily. 01/22/16  Yes Orpah Greek, MD   BP 137/109 mmHg  Pulse 92  Temp(Src) 98.5 F (36.9 C) (Oral)  Resp 18  SpO2 96% Physical Exam  Constitutional: He is oriented to person, place, and time. He appears well-developed and well-nourished.  HENT:  Head: Normocephalic and atraumatic.  Eyes: EOM are normal. Pupils are equal, round, and reactive to light.  Neck: Normal range of motion. Neck supple. No JVD present.  Cardiovascular: Normal rate and regular rhythm.  Exam reveals no gallop and no friction rub.   No murmur heard. Pulmonary/Chest: No respiratory distress. He has no wheezes.  Abdominal: He exhibits no distension. There is no tenderness. There is no rebound and no guarding.  Musculoskeletal: Normal range of motion.  Neurological: He is alert and oriented to person, place, and time.  Skin: No rash noted. No pallor.  Psychiatric: He has a normal mood and affect. His behavior is normal.  Nursing note and vitals reviewed.   ED Course  Procedures (including critical care time) Labs Review Labs Reviewed - No data to display  Imaging Review No results found. I have personally reviewed and evaluated these images and lab results as part of my medical decision-making.   EKG Interpretation None      MDM   Final diagnoses:  Alcohol intoxication, with unspecified complication  [facility])    55 yo M who had the police and 329 called him for being loud while drunk. Patient is able to have a conversation with me without difficulty able to ambulate. I see no need for further evaluation in the emergency department.  5:48 AM:  I have discussed the diagnosis/risks/treatment options with the patient and believe the pt to be eligible for discharge home to follow-up with PCP. We also discussed returning to the ED immediately if new or worsening sx occur. We discussed the sx which are most concerning (e.g., sudden worsening pain, fever, inability to tolerate by mouth) that necessitate immediate return. Medications administered to the patient during their visit and any new prescriptions provided to the patient are listed below.  Medications given during this visit Medications  potassium chloride SA (K-DUR,KLOR-CON) CR tablet 40 mEq (40 mEq Oral Given 03/17/16 0548)    New Prescriptions   No medications on file    The patient appears reasonably screen and/or stabilized for discharge and I doubt any other medical condition or other [facility] requiring further screening, evaluation, or treatment in the ED at this time prior to discharge.      Deno Etienne, DO 03/17/16 [phone]

## 2016-03-17 NOTE — Discharge Instructions (Signed)
Community Resource Guide Outpatient Counseling/Substance Abuse Adult °The United Way’s “211” is a great source of information about community services available.  Access by dialing 2-1-1 from anywhere in [place], or by website -  www.nc211.org.  ° °Other Local Resources (Updated 08/2015) ° °Crisis Hotlines °  °Services  ° °  °Area Served  °[facility] • Crisis Hotline, available 24 hours a day, 7 days a week: 800-939-5911 [place], [place]  ° [facility] • Crisis Hotline, available 24 hours a day, 7 days a week: 866-275-9552 [place], [place]  °[facility] • Suicide Prevention Hotline, available 24 hours a day, 7 days a week: 800-273-8255 [place], [place]  °[facility] ° • Crisis Hotline, available 24 hours a day, 7 days a week: 336-676-6840 [place], [place] °  °[facility] • Crisis Hotline, available 24 hours a day, 7 days a week: 800-256-2452 All °  °[facility] • Crisis Hotline, available 24 hours a day, 7 days a week: 877-626-1772 [place]  ° °Other Local Resources (Updated 08/2015) ° °Outpatient Counseling/ Substance Abuse Programs  °Services  ° °  °Address and Phone Number  °[facility] ° • Options include Individual counseling, group counseling, intensive outpatient program (several hours a day, several days a week) °• Offers depression assessments °• Provides methadone maintenance program 336-333-6860 °[address] °[place], [place] [address] °  °[facility] ° • Offers partial hospitalization/day treatment and DUI/DWI programs °• Accepts Medicare, private insurance 336-299-4655 °[address], [address] °[place], [place] [address]  °[facility] ° ° • Services include intensive outpatient program (several hours a day, several days a week), outpatient treatment, DUI/DWI services, family education °• Also has some services specifically for Veterans °• Offers transitional housing   336-886-5594 °[address] °[place], [place] [address] °  °  °[facility] • Accepts Medicare, private pay, and private insurance 336-272-0855 °[address] °[place], [place] [address]  °[facility] • Services include individual counseling, substance abuse intensive outpatient program (several hours a day, several days a week), day treatment °• Accepts Medicare, Medicaid, private insurance 336-271-5888 °[address] °[place], [place] [address]  °[facility] ° • Offers substance abuse intensive outpatient program (several hours a day, several days a week), partial hospitalization program 336-832-9800 °[address] °[place], [place] [address] ° °336-349-4454 °[address] °[place], [place] [address] ° °336-386-3795 °[address] °, [place] [address] ° °336-993-6120 °[address] °[place], [place] [address]  °[facility] • Individual counseling only °• Accepts private insurance only 336-292-1510 °[address] °[place], [place] [address]  °[facility] • Methadone maintenance program 800-805-6989 °[address] °[place], [place] [address]  °[facility] • Walk-In Clinic providing substance abuse and mental health counseling °• Accepts Medicaid, Medicare, private insurance °• Offers sliding scale for uninsured 336-342-8316 °[address] °[place], [place]   °[facility] • Offers individual counseling, and intensive in-home services 336-347-7415 °[address] °, [place] [address]  °[facility] • Offers individual counseling, family counseling, group therapy, domestic violence counseling, consumer credit counseling °• Accepts Medicare, Medicaid, private insurance °• Offers sliding scale for uninsured 336-387-6161 °[address] °, [place] [address] ° °336-889-6161 °[place], [address] °[place],  [address]  °[facility] • Offers individual, family   and group counseling  3 locations - [place], [place], and [place]  [address] [place], [place] [address]  [address] [place], [place] [address]  [place], [place] [address]  [facility]    Offers psychiatric assessment, 8-week Intensive Outpatient Program (several hours a day, several times a week, daytime or evenings), early recovery group, family Program, medication management  Private pay or private insurance only [phone], or  [phone] [address] [place], [place] [address]  [facility]  Offers individual, couples and family counseling  Accepts Medicaid, private insurance, and sliding scale for uninsured [phone] [address], [place] [address]  Launa Flight, MD  Individual counseling  Private insurance [phone] [place], [place] [address]  [facility]   Offers assessment, substance abuse treatment, and behavioral health treatment [phone] [address] [place], [place] [address]  [place]  Individual counseling  Accepts private insurance [phone] [place], [place] [address]  [facility]  Individual counseling  Blinda Leatherwood, private insurance [phone] [place], [place] [address]  [place]    Offers intensive outpatient program (several hours a day, several times a week)  Private pay, private insurance [phone] [place], [place]  Individual counseling  Medicare, private insurance [phone] [address] [place], [place] [address]  [place]    Offers intensive outpatient program (several hours a day, several times a week) and partial hospitalization  program [phone] [place], [place] [address]  Letta Moynahan, MD  Individual counseling [phone] [address] [place], [place] [address]  [facility] to individuals, couples, and families  Accepts Medicare and private insurance; offers sliding scale for uninsured [phone] [place], [place] [address]  [facility]  Christian counseling [phone] [address], [place], [place] [address]  [facility] crisis counseling, individual counseling, group therapy, in-home therapy, domestic violence services, day treatment, DWI services, Conservation officer, nature (CST), Assertive Community Treatment Team (ACTT), substance abuse Intensive Outpatient Program (several hours a day, several times a week)  2 locations - [place] and [place], [place] [address]  [phone] [address] [place], [place] [address]  [facility]  [place] insurance, [place], [place] [phone] [address] [place], [place]  [facility]  Offers individual and family counseling  Offers LGBTQ services  Accepts private insurance and private pay [phone] [place], [place] [address]  [facility]    Offers individual counseling, group therapy, and outpatient detox  Accepts private insurance [phone] [place], [place] Medicare, private insurance [phone] [address] [place], [place] [address]  [employer]  Individual counseling  Accepts Medicare, private insurance [phone] [address],  [address]  [facility] [place] substance abuse  Intensive Outpatient Program (several hours a day, several times a week) [phone], or [phone] [place], [place]   Alcohol Intoxication Alcohol intoxication occurs when you drink enough alcohol that it affects your ability to function. It can be mild or very severe. Drinking a lot of alcohol in a short time is called  binge drinking. This can be very harmful. Drinking alcohol can also be more dangerous if you are taking medicines or other drugs. Some of the effects caused by alcohol may include:  Loss of coordination.  Changes in mood and behavior.  Unclear thinking.  Trouble talking (slurred speech).  Throwing up (vomiting).  Confusion.  Slowed breathing.  Twitching and shaking (seizures).  Loss of consciousness. HOME CARE  Do not drive after drinking alcohol.  Drink enough water and fluids to keep your pee (urine) clear or pale yellow. Avoid caffeine.  Only take medicine as told by your doctor. GET HELP IF:  You throw up (vomit) many times.  You do not feel better after a few days.  You frequently have alcohol intoxication. Your doctor can help decide if you should see a substance use treatment counselor. GET HELP RIGHT AWAY IF:  You become shaky when you stop drinking.  You have twitching and shaking.  You throw up blood. It may look bright red or like coffee grounds.  You notice blood in your poop (bowel movements).  You become lightheaded or pass out (faint). MAKE SURE YOU:   Understand these instructions.  Will watch your condition.  Will get help right away if you are not doing well or get worse.   This information is not intended to replace advice given to you by your health care provider. Make sure you discuss any questions you have with your health care provider.   Document Released: 01/31/2008 Document Revised: 04/16/2013 Document Reviewed: 01/17/2013 Elsevier Interactive Patient Education [employer].

## 2016-03-20 ENCOUNTER — Ambulatory Visit (HOSPITAL_COMMUNITY)
Admission: RE | Admit: 2016-03-20 | Discharge: 2016-03-20 | Disposition: A | Discharge status: Home / Self Care | Payer: Medicaid Other | Attending: Psychiatry | Admitting: Psychiatry

## 2016-03-20 ENCOUNTER — Emergency Department (HOSPITAL_COMMUNITY)
Admission: EM | Admit: 2016-03-20 | Discharge: 2016-03-21 | Disposition: A | Discharge status: Home / Self Care | Payer: Medicaid Other | Attending: Emergency Medicine | Admitting: Emergency Medicine

## 2016-03-20 ENCOUNTER — Emergency Department (HOSPITAL_COMMUNITY)
Admission: EM | Admit: 2016-03-20 | Discharge: 2016-03-20 | Discharge status: Against Medical Advice | Payer: Medicaid Other

## 2016-03-20 ENCOUNTER — Encounter (HOSPITAL_COMMUNITY): Payer: Self-pay | Admitting: Emergency Medicine

## 2016-03-20 DIAGNOSIS — Z046 Encounter for general psychiatric examination, requested by authority: Secondary | ICD-10-CM

## 2016-03-20 DIAGNOSIS — F1419 Cocaine abuse with unspecified cocaine-induced disorder: Secondary | ICD-10-CM | POA: Diagnosis not present

## 2016-03-20 DIAGNOSIS — F10280 Alcohol dependence with alcohol-induced anxiety disorder: Secondary | ICD-10-CM | POA: Diagnosis present

## 2016-03-20 DIAGNOSIS — Z0000 Encounter for general adult medical examination without abnormal findings: Secondary | ICD-10-CM | POA: Diagnosis present

## 2016-03-20 DIAGNOSIS — R259 Unspecified abnormal involuntary movements: Secondary | ICD-10-CM | POA: Insufficient documentation

## 2016-03-20 DIAGNOSIS — Z79899 Other long term (current) drug therapy: Secondary | ICD-10-CM | POA: Insufficient documentation

## 2016-03-20 DIAGNOSIS — F17210 Nicotine dependence, cigarettes, uncomplicated: Secondary | ICD-10-CM | POA: Insufficient documentation

## 2016-03-20 DIAGNOSIS — Z8673 Personal history of transient ischemic attack (TIA), and cerebral infarction without residual deficits: Secondary | ICD-10-CM | POA: Insufficient documentation

## 2016-03-20 DIAGNOSIS — F1020 Alcohol dependence, uncomplicated: Secondary | ICD-10-CM | POA: Diagnosis present

## 2016-03-20 DIAGNOSIS — F329 Major depressive disorder, single episode, unspecified: Secondary | ICD-10-CM | POA: Insufficient documentation

## 2016-03-20 DIAGNOSIS — R569 Unspecified convulsions: Secondary | ICD-10-CM | POA: Diagnosis not present

## 2016-03-20 DIAGNOSIS — J449 Chronic obstructive pulmonary disease, unspecified: Secondary | ICD-10-CM | POA: Insufficient documentation

## 2016-03-20 DIAGNOSIS — F10220 Alcohol dependence with intoxication, uncomplicated: Secondary | ICD-10-CM | POA: Diagnosis not present

## 2016-03-20 LAB — COMPREHENSIVE METABOLIC PANEL
ALT: 98 U/L — ABNORMAL HIGH (ref 17–63)
ANION GAP: 9 (ref 5–15)
AST: 310 U/L — AB (ref 15–41)
Albumin: 3.7 g/dL (ref 3.5–5.0)
Alkaline Phosphatase: 174 U/L — ABNORMAL HIGH (ref 38–126)
BILIRUBIN TOTAL: 1.8 mg/dL — AB (ref 0.3–1.2)
BUN: 7 mg/dL (ref 6–20)
CO2: 25 mmol/L (ref 22–32)
Calcium: 8.7 mg/dL — ABNORMAL LOW (ref 8.9–10.3)
Chloride: 107 mmol/L (ref 101–111)
Creatinine, Ser: 0.72 mg/dL (ref 0.61–1.24)
GFR calc Af Amer: >60 mL/min (ref >60)
Glucose, Bld: 95 mg/dL (ref 65–99)
POTASSIUM: 5 mmol/L (ref 3.5–5.1)
Sodium: 141 mmol/L (ref 135–145)
TOTAL PROTEIN: 8.1 g/dL (ref 6.5–8.1)

## 2016-03-20 LAB — RAPID URINE DRUG SCREEN, HOSP PERFORMED
AMPHETAMINES: NOT DETECTED
BENZODIAZEPINES: NOT DETECTED
Barbiturates: NOT DETECTED
Cocaine: POSITIVE — AB
OPIATES: NOT DETECTED
Tetrahydrocannabinol: NOT DETECTED

## 2016-03-20 LAB — CBC
HCT: 35.2 % — ABNORMAL LOW (ref 39.0–52.0)
Hemoglobin: 12.1 g/dL — ABNORMAL LOW (ref 13.0–17.0)
MCH: 31.1 pg (ref 26.0–34.0)
MCHC: 34.4 g/dL (ref 30.0–36.0)
MCV: 90.5 fL (ref 78.0–100.0)
Platelets: 111 10*3/uL — ABNORMAL LOW (ref 150–400)
RBC: 3.89 MIL/uL — AB (ref 4.22–5.81)
RDW: 15 % (ref 11.5–15.5)
WBC: 5.9 10*3/uL (ref 4.0–10.5)

## 2016-03-20 LAB — ETHANOL: ALCOHOL ETHYL (B): 296 mg/dL — AB (ref <5)
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
## 2016-03-21 DIAGNOSIS — F10220 Alcohol dependence with intoxication, uncomplicated: Secondary | ICD-10-CM | POA: Diagnosis not present

## 2016-03-21 MED ORDER — IBUPROFEN 200 MG PO TABS
600.0000 mg | ORAL_TABLET | Freq: Three times a day (TID) | ORAL | Status: DC | PRN
  Administered 2016-03-21: 600 mg via ORAL
  Filled 2016-03-21: qty 3

## 2016-03-21 MED ORDER — ONDANSETRON HCL 4 MG PO TABS: 4.0000 mg | ORAL_TABLET | Freq: Three times a day (TID) | ORAL | Status: DC | PRN

## 2016-03-21 MED ORDER — LORAZEPAM 1 MG PO TABS: 0.0000 mg | ORAL_TABLET | Freq: Two times a day (BID) | ORAL | Status: DC

## 2016-03-21 MED ORDER — NICOTINE 21 MG/24HR TD PT24: 21.0000 mg | MEDICATED_PATCH | Freq: Every day | TRANSDERMAL | 0 refills | Status: AC

## 2016-03-21 MED ORDER — LORAZEPAM 1 MG PO TABS
0.0000 mg | ORAL_TABLET | Freq: Four times a day (QID) | ORAL | Status: DC
  Administered 2016-03-21: 1 mg via ORAL
  Filled 2016-03-21: qty 1

## 2016-03-21 MED ORDER — NICOTINE 21 MG/24HR TD PT24: 21.0000 mg | MEDICATED_PATCH | Freq: Every day | TRANSDERMAL | Status: DC
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
## 2016-03-21 NOTE — ED Notes (Signed)
03/21/16  1547  Returned patient belongings from locker #30, Mario Andrews pt a bus pass, reviewed discharge instructions with patient.

## 2016-03-21 NOTE — BH Assessment (Signed)
[facility] Assessment Progress Note  Per Corena Pilgrim, MD, this pt does not require psychiatric hospitalization at this time.  Pt is to be discharged from [facility] with referral information for area substance abuse treatment providers.  Discharge instructions advise pt to follow up with [facility], [facility], [facility], and [facility].  Pt's nurse has been notified.  Jalene Mullet, [place] Triage Specialist [phone]

## 2016-03-21 NOTE — [facility] Suicide Risk Assessment (Signed)
Suicide Risk Assessment  Discharge Assessment   [facility] Discharge Suicide Risk Assessment   Principal Problem: Alcohol dependence [facility]) Discharge Diagnoses:  Patient Active Problem List   Diagnosis Date Noted  . Snake bite [T63.001A] 05/15/2014  . Cocaine abuse [F14.10] 08/29/2013  . Anxiety state, unspecified [F41.1] 08/29/2013  . MDD (major depressive disorder), recurrent episode ([place]) [F33.9] 03/17/2012    Class: Acute  . Alcohol abuse, continuous [F10.10] 02/21/2012    Class: Acute  . Alcohol dependence ([place]) [F10.20] 02/14/2012    Total Time spent with patient: 30 minutes  Musculoskeletal: Strength & Muscle Tone: within normal limits Gait & Station: normal Patient leans: N/A  Psychiatric Specialty Exam: Physical Exam  ROS  Blood pressure 123/84, pulse 81, temperature 98.2 F (36.8 C), temperature source Oral, resp. rate 18, SpO2 99 %.There is no height or weight on file to calculate BMI.  General Appearance: Neat  Eye Contact:  Good  Speech:  Clear and Coherent  Volume:  Normal  Mood:  Anxious and Hopeless  Affect:  Appropriate  Thought Process:  Coherent  Orientation:  Full (Time, Place, and Person)  Thought Content:  Rumination  Suicidal Thoughts:  No  Homicidal Thoughts:  No  Memory:  Immediate;   Fair Recent;   Fair Remote;   Fair  Judgement:  Fair  Insight:  Fair  Psychomotor Activity:  Normal  Concentration:  Concentration: Fair and Attention Span: Fair  Recall:  [employer] of Knowledge:  Fair  Language:  Fair  Akathisia:  Negative  Handed:  Right  AIMS (if indicated):     Assets:  Communication Skills Desire for Improvement Social Support  ADL's:  Intact  Cognition:  WNL  Sleep:   "ok"   Mental Status Per Nursing Assessment::   On Admission:     Demographic Factors:  Male, Divorced or widowed, Low socioeconomic status and Living alone  Loss Factors: Decline in physical health and Financial problems/change in socioeconomic  status  Historical Factors: Family history of suicide  Risk Reduction Factors:   Living with another person, especially a relative  Continued Clinical Symptoms:  Depression:   Impulsivity Alcohol/Substance Abuse/Dependencies  Cognitive Features That Contribute To Risk:  Closed-mindedness    Suicide Risk:  Minimal: No identifiable suicidal ideation.  Patients presenting with no risk factors but with morbid ruminations; may be classified as minimal risk based on the severity of the depressive symptoms    Plan Of Care/Follow-up recommendations:  Activity:  as tol Diet:  as Kathrynn Humble May Delfina Schreurs, NP [facility] 03/21/2016, 11:52 AM

## 2016-03-21 NOTE — Discharge Instructions (Signed)
To help you maintain a sober lifestyle, a substance abuse treatment program may be beneficial to you.  Contact the following providers at your earliest opportunity to ask about enrolling in their program:  RESIDENTIAL PROGRAMS:       [facility]      [place], [place] [address]      [phone]      [address] [place], [place] [address]      [phone]       [facility]      [place], [place] [address]      [phone]  OUTPATIENT PROGRAMS:       [facility]      [address] [place]. [place], [place] [address]      [phone]      New patients are seen at the walk-in clinic every Tuesday from 9:00 am - 12:00 pm.

## 2016-03-21 NOTE — ED Provider Notes (Signed)
[facility] DEPT Provider Note   CSN: [id number] Arrival date & time: 03/20/16  2135  First Provider Contact:  2:18 AM   By signing my name below, I, Mario Andrews, attest that this documentation has been prepared under the direction and in the presence of [employer], PA-C.  Electronically Signed: Reola Andrews, ED Scribe. 03/21/16. 2:21 AM.  History   Chief Complaint Chief Complaint  Patient presents with  . Medical Clearance   The history is provided by the patient. No language interpreter was used.   HPI Comments: Mario Andrews is a 55 y.o. male with a PMHx of EtOH abuse, anxiety, cocaine abuse, COPD, depression, and a stroke, who presents to the Emergency Department to be medically cleared. Pt reports that he went to [facility] prior to coming to the ED because he wanted to be admitted, and states that "I have been there twice before". He reports that he has drank "a few beers" prior to seeking admittance to [facility]. Pt denies having done illicit drug prior to coming into the ED. Denies HI or SI.   Past Medical History:  Diagnosis Date  . Alcohol abuse   . Anxiety   . Cocaine abuse   . COPD (chronic obstructive pulmonary disease) ([place])   . Depression   . Hepatitis C   . Seizures ([place])    last seizure 2 months ago  . Stroke [facility])    2009    Patient Active Problem List   Diagnosis Date Noted  . Snake bite 05/15/2014  . Cocaine abuse 08/29/2013  . Anxiety state, unspecified 08/29/2013  . MDD (major depressive disorder), recurrent episode ([place]) 03/17/2012    Class: Acute  . Alcohol abuse, continuous 02/21/2012    Class: Acute  . Alcohol dependence ([place]) 02/14/2012   Past Surgical History:  Procedure Laterality Date  . NO PAST SURGERIES      Home Medications    Prior to Admission medications   Medication Sig Start Date End Date Taking? Authorizing Provider  ranitidine (ZANTAC) 150 MG tablet Take 1 tablet (150 mg total) by mouth 2 (two) times daily.  01/22/16  Yes Orpah Greek, MD    Family History Family History  Problem Relation Age of Onset  . Cancer Mother   . Stroke Father   . Cancer Father     Social History Social History  Substance Use Topics  . Smoking status: Current Every Day Smoker    Packs/day: 0.50    Years: 18.00    Types: Cigarettes  . Smokeless tobacco: Never Used  . Alcohol use Yes     Comment: pt states 2 quarts daily   Allergies   Peanut-containing drug products and Tramadol   Review of Systems Review of Systems A complete 10 system review of systems was obtained and all systems are negative except as noted in the HPI and PMH.   Physical Exam Updated Vital Signs BP 123/84 (BP Location: Right Arm)   Pulse 81   Temp 98.2 F (36.8 C) (Oral)   Resp 18   SpO2 99%   Physical Exam  Constitutional: He is oriented to person, place, and time. He appears well-developed and well-nourished. No distress.  HENT:  Head: Normocephalic and atraumatic.  Eyes: Conjunctivae and EOM are normal. No scleral icterus.  Neck: Normal range of motion.  Pulmonary/Chest: Effort normal. No respiratory distress.  Musculoskeletal: Normal range of motion.  Neurological: He is alert and oriented to person, place, and time.  Skin: Skin  is warm and dry. No rash noted. He is not diaphoretic. No erythema. No pallor.  Psychiatric: He has a normal mood and affect. His behavior is normal. He expresses no homicidal and no suicidal ideation.  Nursing note and vitals reviewed.   ED Treatments / Results  DIAGNOSTIC STUDIES: Oxygen Saturation is 99% on RA, normal by my interpretation.   COORDINATION OF CARE: 2:21 AM-Discussed next steps with pt. Pt verbalized understanding and is agreeable with the plan.   Labs (all labs ordered are listed, but only abnormal results are displayed) Labs Reviewed  COMPREHENSIVE METABOLIC PANEL - Abnormal; Notable for the following:       Result Value   Calcium 8.7 (*)    AST 310 (*)     ALT 98 (*)    Alkaline Phosphatase 174 (*)    Total Bilirubin 1.8 (*)    All other components within normal limits  ETHANOL - Abnormal; Notable for the following:    Alcohol, Ethyl (B) 296 (*)    All other components within normal limits  CBC - Abnormal; Notable for the following:    RBC 3.89 (*)    Hemoglobin 12.1 (*)    HCT 35.2 (*)    Platelets 111 (*)    All other components within normal limits  URINE RAPID DRUG SCREEN, HOSP PERFORMED - Abnormal; Notable for the following:    Cocaine POSITIVE (*)    All other components within normal limits    EKG  EKG Interpretation None       Radiology No results found.  Procedures Procedures (including critical care time)  Medications Ordered in ED Medications  LORazepam (ATIVAN) tablet 0-4 mg (not administered)    Followed by  LORazepam (ATIVAN) tablet 0-4 mg (not administered)  nicotine (NICODERM CQ - dosed in mg/24 hours) patch 21 mg (not administered)  ibuprofen (ADVIL,MOTRIN) tablet 600 mg (not administered)  ondansetron (ZOFRAN) tablet 4 mg (not administered)     Initial Impression / Assessment and Plan / ED Course  I have reviewed the triage vital signs and the nursing notes.  Pertinent labs & imaging results that were available during my care of the patient were reviewed by me and considered in my medical decision making (see chart for details).  Clinical Course    Patient presenting for behavioral health for medical clearance. LFTs slightly elevated, but rather c/w prior evaluations. Patient has been medically cleared and is pending placement. Disposition to be determined by oncoming ED provider.   Final Clinical Impressions(s) / ED Diagnoses   Final diagnoses:  Involuntary commitment    New Prescriptions New Prescriptions   No medications on file   I personally performed the services described in this documentation, which was scribed in my presence. The recorded information has been reviewed and is  accurate.      Antonietta Breach, PA-C 03/21/16 [place], PA-C 03/21/16 7416    Shanon Rosser, MD 03/21/16 0700

## 2016-10-20 ENCOUNTER — Encounter (HOSPITAL_COMMUNITY): Payer: Self-pay | Admitting: Emergency Medicine

## 2016-10-20 ENCOUNTER — Emergency Department (HOSPITAL_COMMUNITY)
Admission: EM | Admit: 2016-10-20 | Discharge: 2016-10-21 | Discharge status: Against Medical Advice | Payer: Medicaid Other | Attending: Emergency Medicine | Admitting: Emergency Medicine

## 2016-10-20 ENCOUNTER — Emergency Department (HOSPITAL_COMMUNITY): Payer: Medicaid Other

## 2016-10-20 DIAGNOSIS — Z8673 Personal history of transient ischemic attack (TIA), and cerebral infarction without residual deficits: Secondary | ICD-10-CM | POA: Insufficient documentation

## 2016-10-20 DIAGNOSIS — F17210 Nicotine dependence, cigarettes, uncomplicated: Secondary | ICD-10-CM | POA: Insufficient documentation

## 2016-10-20 DIAGNOSIS — A419 Sepsis, unspecified organism: Secondary | ICD-10-CM | POA: Diagnosis not present

## 2016-10-20 DIAGNOSIS — E871 Hypo-osmolality and hyponatremia: Secondary | ICD-10-CM | POA: Diagnosis not present

## 2016-10-20 DIAGNOSIS — Z79899 Other long term (current) drug therapy: Secondary | ICD-10-CM | POA: Insufficient documentation

## 2016-10-20 DIAGNOSIS — Z91010 Allergy to peanuts: Secondary | ICD-10-CM | POA: Insufficient documentation

## 2016-10-20 DIAGNOSIS — J181 Lobar pneumonia, unspecified organism: Secondary | ICD-10-CM | POA: Insufficient documentation

## 2016-10-20 DIAGNOSIS — E878 Other disorders of electrolyte and fluid balance, not elsewhere classified: Secondary | ICD-10-CM | POA: Diagnosis not present

## 2016-10-20 DIAGNOSIS — J449 Chronic obstructive pulmonary disease, unspecified: Secondary | ICD-10-CM | POA: Diagnosis not present

## 2016-10-20 DIAGNOSIS — R0602 Shortness of breath: Secondary | ICD-10-CM | POA: Diagnosis present

## 2016-10-20 DIAGNOSIS — J189 Pneumonia, unspecified organism: Secondary | ICD-10-CM

## 2016-10-20 MED ORDER — SODIUM CHLORIDE 0.9 % IV BOLUS (SEPSIS)
1000.0000 mL | Freq: Once | INTRAVENOUS | Status: AC
  Administered 2016-10-21: 1000 mL via INTRAVENOUS

## 2016-10-20 MED ORDER — ALBUTEROL SULFATE (2.5 MG/3ML) 0.083% IN NEBU
5.0000 mg | INHALATION_SOLUTION | Freq: Once | RESPIRATORY_TRACT | Status: AC
  Administered 2016-10-21: 5 mg via RESPIRATORY_TRACT
  Filled 2016-10-20: qty 6

## 2016-10-20 MED ORDER — IPRATROPIUM BROMIDE 0.02 % IN SOLN
0.5000 mg | Freq: Once | RESPIRATORY_TRACT | Status: AC
  Administered 2016-10-21: 0.5 mg via RESPIRATORY_TRACT
  Filled 2016-10-20: qty 2.5

## 2016-10-20 MED ORDER — METHYLPREDNISOLONE SODIUM SUCC 125 MG IJ SOLR
125.0000 mg | Freq: Once | INTRAMUSCULAR | Status: AC
  Administered 2016-10-21: 125 mg via INTRAVENOUS
  Filled 2016-10-20: qty 2

## 2016-10-20 NOTE — ED Provider Notes (Signed)
[facility] DEPT Provider Note   CSN: [id number] Arrival date & time: 10/20/16  2127     History   Chief Complaint Chief Complaint  Patient presents with  . Cough  . rib pain    HPI Mario Andrews is a 56 y.o. male.  The history is provided by the patient and medical records.  Cough  Associated symptoms include chest pain (rib), shortness of breath and wheezing.    56 y.o. M with hx of alcohol abuse, anxiety, depression, COPD, Hep C, prior stroke, hx of seizures, presenting to the ED for cough, chest pain, and SOB.  Patient reports for the past week he has been having a "bad cough" which has bern productive of white sputum.  States over the past 2 days he has become increasingly more SOB.  States his breathing feels labored, worse with exertional activities.  He states he has felt febrile.  No nausea/vomiting.  States he has been trying to detox from alcohol and cocaine but used again this afternoon.  States he dusted his house today and turned the wrong way and felt pain in his right ribs.  He denies any numbness or weakness.  Patient is a daily smoker, approx 2PPD.  Denies IV drug use.  No known cardiac hx, however has not seen a doctor in several years.  Patient's family member is here at the bedside with him and is concerned about his weight loss.  He reports he was always thin, but never to this degree.  Patient reports he has been eating and drinking normally he thinks--- ate a can of tuna and doughnut today.  Past Medical History:  Diagnosis Date  . Alcohol abuse   . Anxiety   . Cocaine abuse   . COPD (chronic obstructive pulmonary disease) ([place])   . Depression   . Hepatitis C   . Seizures ([place])    last seizure 2 months ago  . Stroke [facility])    2009    Patient Active Problem List   Diagnosis Date Noted  . Snake bite 05/15/2014  . Cocaine abuse 08/29/2013  . Anxiety state, unspecified 08/29/2013  . MDD (major depressive disorder), recurrent episode ([place]) 03/17/2012      Class: Acute  . Alcohol abuse, continuous 02/21/2012    Class: Acute  . Alcohol dependence ([place]) 02/14/2012    Past Surgical History:  Procedure Laterality Date  . NO PAST SURGERIES         Home Medications    Prior to Admission medications   Medication Sig Start Date End Date Taking? Authorizing Provider  nicotine (NICODERM CQ - DOSED IN MG/24 HOURS) 21 mg/24hr patch Place 1 patch (21 mg total) onto the skin daily. Patient not taking: Reported on 10/20/2016 03/21/16   Kerrie Buffalo, NP    Family History Family History  Problem Relation Age of Onset  . Cancer Mother   . Stroke Father   . Cancer Father     Social History Social History  Substance Use Topics  . Smoking status: Current Every Day Smoker    Packs/day: 0.50    Years: 18.00    Types: Cigarettes  . Smokeless tobacco: Never Used  . Alcohol use Yes     Comment: pt states 2 quarts daily     Allergies   Peanut-containing drug products and Tramadol   Review of Systems Review of Systems  Constitutional: Positive for fever and unexpected weight change.  Respiratory: Positive for cough, shortness of breath and wheezing.  Cardiovascular: Positive for chest pain (rib).  All other systems reviewed and are negative.    Physical Exam Updated Vital Signs BP 148/95   Pulse 109   Temp 99.2 F (37.3 C) (Oral)   Resp 22   SpO2 94%   Physical Exam  Constitutional: He is oriented to person, place, and time. He appears well-developed and well-nourished. He appears ill.  Very thin, frail  HENT:  Head: Normocephalic and atraumatic.  Mouth/Throat: Oropharynx is clear and moist.  Temporal wasting noted, dry mucous membranes  Eyes: Conjunctivae and EOM are normal. Pupils are equal, round, and reactive to light.  Neck: Normal range of motion.  Cardiovascular: Regular rhythm and normal heart sounds.  Tachycardia present.   Tachycardic around 110 during exam  Pulmonary/Chest: Tachypnea noted. He has  wheezes. He has rhonchi.  Intermixed wheezes and rhonchi, speaking in short truncated sentences, tachypneic, O2 sats 93% during exam  Abdominal: Soft. Bowel sounds are normal.  Musculoskeletal: Normal range of motion.  Neurological: He is alert and oriented to person, place, and time.  Skin: Skin is warm and dry.  Psychiatric: He has a normal mood and affect.  Nursing note and vitals reviewed.    ED Treatments / Results  Labs (all labs ordered are listed, but only abnormal results are displayed) Labs Reviewed  CBC WITH DIFFERENTIAL/PLATELET - Abnormal; Notable for the following:       Result Value   WBC 27.1 (*)    RBC 3.72 (*)    Hemoglobin 11.5 (*)    HCT 33.3 (*)    Neutro Abs 24.9 (*)    Monocytes Absolute 1.4 (*)    All other components within normal limits  URINALYSIS, ROUTINE W REFLEX MICROSCOPIC - Abnormal; Notable for the following:    Color, Urine AMBER (*)    Bilirubin Urine SMALL (*)    Protein, ur 30 (*)    All other components within normal limits  RAPID URINE DRUG SCREEN, HOSP PERFORMED - Abnormal; Notable for the following:    Cocaine POSITIVE (*)    Benzodiazepines POSITIVE (*)    All other components within normal limits  COMPREHENSIVE METABOLIC PANEL - Abnormal; Notable for the following:    Sodium 128 (*)    Chloride 92 (*)    Glucose, Bld 107 (*)    Calcium 7.9 (*)    Albumin 1.9 (*)    AST 53 (*)    Alkaline Phosphatase 144 (*)    Total Bilirubin 1.6 (*)    All other components within normal limits  I-STAT CG4 LACTIC ACID, ED - Abnormal; Notable for the following:    Lactic Acid, Venous 2.32 (*)    All other components within normal limits  URINE CULTURE  CULTURE, BLOOD (ROUTINE X 2)  CULTURE, BLOOD (ROUTINE X 2)  RAPID HIV SCREEN (HIV 1/2 AB+AG)  ETHANOL  I-STAT TROPOININ, ED  I-STAT CG4 LACTIC ACID, ED    EKG  EKG Interpretation  Date/Time:  Friday October 20 2016 21:45:59 EST Ventricular Rate:  107 PR Interval:  156 QRS  Duration: 76 QT Interval:  312 QTC Calculation: 416 R Axis:   75 Text Interpretation:  Sinus tachycardia Nonspecific ST abnormality Abnormal ECG Confirmed by WARD,  DO, KRISTEN ([id number]) on 10/21/2016 2:36:59 AM       Radiology Dg Chest 2 View  Result Date: 10/20/2016 CLINICAL DATA:  56 year old male with cough and fever and shortness of breath. EXAM: CHEST  2 VIEW COMPARISON:  Chest radiograph dated 09/09/2013 FINDINGS:  There is a large area of airspace opacity involving the right upper lobe most consistent with pneumonia. Clinical correlation and follow-up to resolution is recommended to exclude underlying mass. Multiple small biapical subpleural blebs noted. The left lung is clear. There is no pleural effusion or pneumothorax. The cardiac silhouette is within normal limits. No acute osseous pathology. IMPRESSION: Near complete consolidative changes of the right upper lobe most consistent with pneumonia. Clinical correlation and follow-up to resolution recommended. Electronically Signed   By: Anner Crete M.D.   On: 10/20/2016 22:05    Procedures Procedures (including critical care time)  Medications Ordered in ED Medications  albuterol (PROVENTIL HFA;VENTOLIN HFA) 108 (90 Base) MCG/ACT inhaler 2 puff (not administered)  methylPREDNISolone sodium succinate (SOLU-MEDROL) 125 mg/2 mL injection 125 mg (125 mg Intravenous Given 10/21/16 0030)  albuterol (PROVENTIL) (2.5 MG/3ML) 0.083% nebulizer solution 5 mg (5 mg Nebulization Given 10/21/16 0010)  ipratropium (ATROVENT) nebulizer solution 0.5 mg (0.5 mg Nebulization Given 10/21/16 0010)  sodium chloride 0.9 % bolus 1,000 mL (0 mLs Intravenous Stopped 10/21/16 0135)     Initial Impression / Assessment and Plan / ED Course  I have reviewed the triage vital signs and the nursing notes.  Pertinent labs & imaging results that were available during my care of the patient were reviewed by me and considered in my medical decision making (see chart  for details).  56 year old male here with cough, fever, and shortness of breath. On exam he is ill-appearing, tachypneic, with increased work of breathing. His lung sounds are junky with intermixed wheezes and rhonchi, worse on the right. He is able to speak in short truncated sentences. Patient had chest x-ray performed from triage which does reveal a right upper lobe pneumonia. Given his overall ill appearance, feeling needs further workup. We'll plan for labs, blood and urine cultures. He was given IV fluids as well as IV Solu-Medrol and albuterol treatment.  Patient's laboratory with elevated lactic acid of 2.32. He has a significant leukocytosis of 27,000. Chemistry is borderline with mild hyponatremia and hypochloremia. AST and alkaline phosphatase are elevated, similar to prior on comparison. HIV was sent and is nonreactive. Blood and urine cultures pending. UDS is positive for cocaine and benzos. Troponin is negative. Continues to deny any chest pain. Patient reports he is feeling better after the albuterol treatment. He has been able to cough up large amounts of mucus and states his breathing feels better. He continues to appear ill however. Given his lab and chest x-ray findings, I have recommended hospital admission. Patient strongly rejects this. He states he is feeling better and he needs to go home in order to go to work tomorrow. I discussed with him that I'm concerned for sepsis secondary to his pneumonia and that his condition will likely worsen and can lead to worsening condition, respiratory failure, cardiovascular compromise, or even death. Patient acknowledged these risks, but still insists that he will be going home tonight. Patient is of sound mind, does not appear clinically intoxicated, and is capable of making his own medical decisions at this time. He will sign out [place]. I have prescribed him Levaquin as well as prednisone taper. He was given albuterol inhaler in the  ED. He understands that he may worsen and if so, I have recommended that he come back to the ED immediately.  Patient acknowledged understanding of this.  He did sign AMA papers prior to leaving ED.  Final Clinical Impressions(s) / ED Diagnoses   Final diagnoses:  Community acquired pneumonia of right upper lobe of lung ([place])  Sepsis, due to unspecified organism ([place])  Hyponatremia  Hypochloremia    New Prescriptions Discharge Medication List as of 10/21/2016  2:45 AM    START taking these medications   Details  levofloxacin (LEVAQUIN) 750 MG tablet Take 1 tablet (750 mg total) by mouth daily., Starting Sat 10/21/2016, Print    predniSONE (DELTASONE) 20 MG tablet Take 40 mg by mouth daily for 3 days, then '20mg'$  by mouth daily for 3 days, then '10mg'$  daily for 3 days, Print         Larene Pickett, PA-C 10/21/16 [place], DO 10/21/16 [id number]

## 2016-10-20 NOTE — ED Triage Notes (Signed)
Pt to ED via [facility]> c/o generalized weakness, chills, productive cough with white sputum, and sob x 1 week.  Trying to detox from etoh and crack the past 2 weeks but used again today.  States he turned the wrong way while dusting at home today and now has R sided rib pain.

## 2016-10-21 LAB — CBC WITH DIFFERENTIAL/PLATELET
BASOS PCT: 0 %
Basophils Absolute: 0 10*3/uL (ref 0.0–0.1)
EOS ABS: 0 10*3/uL (ref 0.0–0.7)
Eosinophils Relative: 0 %
HCT: 33.3 % — ABNORMAL LOW (ref 39.0–52.0)
Hemoglobin: 11.5 g/dL — ABNORMAL LOW (ref 13.0–17.0)
LYMPHS PCT: 3 %
Lymphs Abs: 0.8 10*3/uL (ref 0.7–4.0)
MCH: 30.9 pg (ref 26.0–34.0)
MCHC: 34.5 g/dL (ref 30.0–36.0)
MCV: 89.5 fL (ref 78.0–100.0)
Monocytes Absolute: 1.4 10*3/uL — ABNORMAL HIGH (ref 0.1–1.0)
Monocytes Relative: 5 %
NEUTROS PCT: 92 %
Neutro Abs: 24.9 10*3/uL — ABNORMAL HIGH (ref 1.7–7.7)
PLATELETS: 234 10*3/uL (ref 150–400)
RBC: 3.72 MIL/uL — ABNORMAL LOW (ref 4.22–5.81)
RDW: 14.8 % (ref 11.5–15.5)
WBC: 27.1 10*3/uL — ABNORMAL HIGH (ref 4.0–10.5)

## 2016-10-21 LAB — RAPID URINE DRUG SCREEN, HOSP PERFORMED
AMPHETAMINES: NOT DETECTED
BENZODIAZEPINES: POSITIVE — AB
Barbiturates: NOT DETECTED
COCAINE: POSITIVE — AB
OPIATES: NOT DETECTED
TETRAHYDROCANNABINOL: NOT DETECTED

## 2016-10-21 LAB — I-STAT TROPONIN, ED: Troponin i, poc: 0 ng/mL (ref 0.00–0.08)

## 2016-10-21 LAB — COMPREHENSIVE METABOLIC PANEL
ALK PHOS: 144 U/L — AB (ref 38–126)
ALT: 18 U/L (ref 17–63)
ANION GAP: 11 (ref 5–15)
AST: 53 U/L — ABNORMAL HIGH (ref 15–41)
Albumin: 1.9 g/dL — ABNORMAL LOW (ref 3.5–5.0)
BILIRUBIN TOTAL: 1.6 mg/dL — AB (ref 0.3–1.2)
BUN: 10 mg/dL (ref 6–20)
CO2: 25 mmol/L (ref 22–32)
Calcium: 7.9 mg/dL — ABNORMAL LOW (ref 8.9–10.3)
Chloride: 92 mmol/L — ABNORMAL LOW (ref 101–111)
Creatinine, Ser: 0.69 mg/dL (ref 0.61–1.24)
GFR calc Af Amer: >60 mL/min (ref >60)
Glucose, Bld: 107 mg/dL — ABNORMAL HIGH (ref 65–99)
POTASSIUM: 3.8 mmol/L (ref 3.5–5.1)
Sodium: 128 mmol/L — ABNORMAL LOW (ref 135–145)
TOTAL PROTEIN: 7.2 g/dL (ref 6.5–8.1)

## 2016-10-21 LAB — RAPID HIV SCREEN (HIV 1/2 AB+AG)
HIV 1/2 Antibodies: NONREACTIVE
HIV-1 P24 Antigen - HIV24: NONREACTIVE

## 2016-10-21 LAB — URINALYSIS, ROUTINE W REFLEX MICROSCOPIC
Bacteria, UA: NONE SEEN
GLUCOSE, UA: NEGATIVE mg/dL
HGB URINE DIPSTICK: NEGATIVE
Ketones, ur: NEGATIVE mg/dL
Leukocytes, UA: NEGATIVE
NITRITE: NEGATIVE
PH: 5 (ref 5.0–8.0)
Protein, ur: 30 mg/dL — AB
Specific Gravity, Urine: 1.025 (ref 1.005–1.030)
Squamous Epithelial / LPF: NONE SEEN

## 2016-10-21 LAB — I-STAT CG4 LACTIC ACID, ED: LACTIC ACID, VENOUS: 2.32 mmol/L — AB (ref 0.5–1.9)

## 2016-10-21 MED ORDER — LEVOFLOXACIN 750 MG PO TABS: 750.0000 mg | ORAL_TABLET | Freq: Every day | ORAL | 0 refills | Status: AC

## 2016-10-21 MED ORDER — ALBUTEROL SULFATE HFA 108 (90 BASE) MCG/ACT IN AERS
2.0000 | INHALATION_SPRAY | RESPIRATORY_TRACT | Status: DC | PRN
  Filled 2016-10-21: qty 6.7

## 2016-10-21 MED ORDER — PREDNISONE 20 MG PO TABS: ORAL_TABLET | ORAL | 0 refills | Status: AC

## 2016-10-21 NOTE — Discharge Instructions (Signed)
We have recommended that you be admitted for your pneumonia and likely sepsis.  You have elected to go home against medical advice.   At minimum you need to take the antibiotics I have prescribed.  Can use albuterol inhaler when needed. You need to return here immediately for any worsening symptoms such as increased shortness of breath, fever, fatigue, chest pain, etc.

## 2016-10-21 NOTE — ED Notes (Signed)
No lab draw,  Pt being discharged

## 2016-10-21 NOTE — ED Notes (Signed)
Pt requesting discharge.

## 2016-10-22 ENCOUNTER — Inpatient Hospital Stay (HOSPITAL_COMMUNITY)
Admission: EM | Admit: 2016-10-22 | Discharge: 2016-11-26 | DRG: 870 | Disposition: E | Payer: Medicaid Other | Attending: Pulmonary Disease | Admitting: Pulmonary Disease

## 2016-10-22 ENCOUNTER — Emergency Department (HOSPITAL_COMMUNITY): Payer: Medicaid Other

## 2016-10-22 DIAGNOSIS — J860 Pyothorax with fistula: Secondary | ICD-10-CM | POA: Diagnosis present

## 2016-10-22 DIAGNOSIS — K7460 Unspecified cirrhosis of liver: Secondary | ICD-10-CM | POA: Diagnosis present

## 2016-10-22 DIAGNOSIS — J810 Acute pulmonary edema: Secondary | ICD-10-CM

## 2016-10-22 DIAGNOSIS — R188 Other ascites: Secondary | ICD-10-CM | POA: Diagnosis present

## 2016-10-22 DIAGNOSIS — J939 Pneumothorax, unspecified: Secondary | ICD-10-CM

## 2016-10-22 DIAGNOSIS — Z8673 Personal history of transient ischemic attack (TIA), and cerebral infarction without residual deficits: Secondary | ICD-10-CM

## 2016-10-22 DIAGNOSIS — Z809 Family history of malignant neoplasm, unspecified: Secondary | ICD-10-CM

## 2016-10-22 DIAGNOSIS — Z452 Encounter for adjustment and management of vascular access device: Secondary | ICD-10-CM

## 2016-10-22 DIAGNOSIS — I10 Essential (primary) hypertension: Secondary | ICD-10-CM | POA: Diagnosis present

## 2016-10-22 DIAGNOSIS — D649 Anemia, unspecified: Secondary | ICD-10-CM | POA: Diagnosis present

## 2016-10-22 DIAGNOSIS — J9382 Other air leak: Secondary | ICD-10-CM | POA: Diagnosis present

## 2016-10-22 DIAGNOSIS — J9690 Respiratory failure, unspecified, unspecified whether with hypoxia or hypercapnia: Secondary | ICD-10-CM

## 2016-10-22 DIAGNOSIS — Z515 Encounter for palliative care: Secondary | ICD-10-CM

## 2016-10-22 DIAGNOSIS — J9811 Atelectasis: Secondary | ICD-10-CM | POA: Diagnosis present

## 2016-10-22 DIAGNOSIS — R451 Restlessness and agitation: Secondary | ICD-10-CM | POA: Diagnosis present

## 2016-10-22 DIAGNOSIS — I959 Hypotension, unspecified: Secondary | ICD-10-CM

## 2016-10-22 DIAGNOSIS — C3411 Malignant neoplasm of upper lobe, right bronchus or lung: Secondary | ICD-10-CM | POA: Diagnosis present

## 2016-10-22 DIAGNOSIS — Z9689 Presence of other specified functional implants: Secondary | ICD-10-CM

## 2016-10-22 DIAGNOSIS — E8770 Fluid overload, unspecified: Secondary | ICD-10-CM | POA: Diagnosis present

## 2016-10-22 DIAGNOSIS — I313 Pericardial effusion (noninflammatory): Secondary | ICD-10-CM | POA: Diagnosis present

## 2016-10-22 DIAGNOSIS — J189 Pneumonia, unspecified organism: Secondary | ICD-10-CM

## 2016-10-22 DIAGNOSIS — E878 Other disorders of electrolyte and fluid balance, not elsewhere classified: Secondary | ICD-10-CM | POA: Diagnosis present

## 2016-10-22 DIAGNOSIS — J811 Chronic pulmonary edema: Secondary | ICD-10-CM | POA: Diagnosis present

## 2016-10-22 DIAGNOSIS — E440 Moderate protein-calorie malnutrition: Secondary | ICD-10-CM | POA: Insufficient documentation

## 2016-10-22 DIAGNOSIS — Z66 Do not resuscitate: Secondary | ICD-10-CM | POA: Diagnosis present

## 2016-10-22 DIAGNOSIS — Z6820 Body mass index (BMI) 20.0-20.9, adult: Secondary | ICD-10-CM

## 2016-10-22 DIAGNOSIS — F1410 Cocaine abuse, uncomplicated: Secondary | ICD-10-CM | POA: Diagnosis present

## 2016-10-22 DIAGNOSIS — Z823 Family history of stroke: Secondary | ICD-10-CM

## 2016-10-22 DIAGNOSIS — G40919 Epilepsy, unspecified, intractable, without status epilepticus: Secondary | ICD-10-CM | POA: Diagnosis present

## 2016-10-22 DIAGNOSIS — E872 Acidosis: Secondary | ICD-10-CM | POA: Diagnosis present

## 2016-10-22 DIAGNOSIS — R6521 Severe sepsis with septic shock: Secondary | ICD-10-CM | POA: Diagnosis present

## 2016-10-22 DIAGNOSIS — R000 Tachycardia, unspecified: Secondary | ICD-10-CM | POA: Diagnosis present

## 2016-10-22 DIAGNOSIS — J9600 Acute respiratory failure, unspecified whether with hypoxia or hypercapnia: Secondary | ICD-10-CM

## 2016-10-22 DIAGNOSIS — G9340 Encephalopathy, unspecified: Secondary | ICD-10-CM | POA: Diagnosis present

## 2016-10-22 DIAGNOSIS — J9601 Acute respiratory failure with hypoxia: Secondary | ICD-10-CM

## 2016-10-22 DIAGNOSIS — J90 Pleural effusion, not elsewhere classified: Secondary | ICD-10-CM

## 2016-10-22 DIAGNOSIS — F17210 Nicotine dependence, cigarettes, uncomplicated: Secondary | ICD-10-CM | POA: Diagnosis present

## 2016-10-22 DIAGNOSIS — K7031 Alcoholic cirrhosis of liver with ascites: Secondary | ICD-10-CM

## 2016-10-22 DIAGNOSIS — R140 Abdominal distension (gaseous): Secondary | ICD-10-CM

## 2016-10-22 DIAGNOSIS — I248 Other forms of acute ischemic heart disease: Secondary | ICD-10-CM | POA: Diagnosis present

## 2016-10-22 DIAGNOSIS — F10239 Alcohol dependence with withdrawal, unspecified: Secondary | ICD-10-CM | POA: Diagnosis present

## 2016-10-22 DIAGNOSIS — J181 Lobar pneumonia, unspecified organism: Secondary | ICD-10-CM

## 2016-10-22 DIAGNOSIS — E43 Unspecified severe protein-calorie malnutrition: Secondary | ICD-10-CM | POA: Diagnosis present

## 2016-10-22 DIAGNOSIS — R64 Cachexia: Secondary | ICD-10-CM | POA: Diagnosis present

## 2016-10-22 DIAGNOSIS — B1920 Unspecified viral hepatitis C without hepatic coma: Secondary | ICD-10-CM | POA: Diagnosis present

## 2016-10-22 DIAGNOSIS — Z888 Allergy status to other drugs, medicaments and biological substances status: Secondary | ICD-10-CM

## 2016-10-22 DIAGNOSIS — A419 Sepsis, unspecified organism: Principal | ICD-10-CM | POA: Diagnosis present

## 2016-10-22 DIAGNOSIS — R569 Unspecified convulsions: Secondary | ICD-10-CM

## 2016-10-22 DIAGNOSIS — J440 Chronic obstructive pulmonary disease with acute lower respiratory infection: Secondary | ICD-10-CM | POA: Diagnosis present

## 2016-10-22 DIAGNOSIS — Z9889 Other specified postprocedural states: Secondary | ICD-10-CM

## 2016-10-22 DIAGNOSIS — J948 Other specified pleural conditions: Secondary | ICD-10-CM | POA: Diagnosis present

## 2016-10-22 LAB — I-STAT ARTERIAL BLOOD GAS, ED
Acid-Base Excess: 1 mmol/L (ref 0.0–2.0)
BICARBONATE: 25.1 mmol/L (ref 20.0–28.0)
O2 Saturation: 97 %
PCO2 ART: 38.1 mmHg (ref 32.0–48.0)
Patient temperature: 98.6
TCO2: 26 mmol/L (ref 0–100)
pH, Arterial: 7.426 (ref 7.350–7.450)
pO2, Arterial: 93 mmHg (ref 83.0–108.0)

## 2016-10-22 LAB — URINE CULTURE: Culture: <10000 — AB

## 2016-10-22 LAB — I-STAT CG4 LACTIC ACID, ED: Lactic Acid, Venous: 1.54 mmol/L (ref 0.5–1.9)

## 2016-10-22 MED ORDER — VANCOMYCIN HCL IN DEXTROSE 1-5 GM/200ML-% IV SOLN
1000.0000 mg | Freq: Once | INTRAVENOUS | Status: AC
  Administered 2016-10-23: 1000 mg via INTRAVENOUS
  Filled 2016-10-22: qty 200

## 2016-10-22 MED ORDER — DEXTROSE 5 % IV SOLN
1.0000 g | Freq: Once | INTRAVENOUS | Status: AC
  Administered 2016-10-23: 1 g via INTRAVENOUS
  Filled 2016-10-22: qty 1

## 2016-10-22 MED ORDER — DEXTROSE 5 % IV SOLN: 1.0000 g | Freq: Once | INTRAVENOUS | Status: DC

## 2016-10-22 MED ORDER — IPRATROPIUM-ALBUTEROL 0.5-2.5 (3) MG/3ML IN SOLN
3.0000 mL | Freq: Once | RESPIRATORY_TRACT | Status: AC
  Administered 2016-10-22: 3 mL via RESPIRATORY_TRACT
  Filled 2016-10-22: qty 3

## 2016-10-22 MED ORDER — AZITHROMYCIN 500 MG IV SOLR: 500.0000 mg | Freq: Once | INTRAVENOUS | Status: DC

## 2016-10-22 MED ORDER — LORAZEPAM 2 MG/ML IJ SOLN
0.5000 mg | Freq: Once | INTRAMUSCULAR | Status: AC
  Administered 2016-10-22: 0.5 mg via INTRAVENOUS
  Filled 2016-10-22: qty 1

## 2016-10-22 MED ORDER — SODIUM CHLORIDE 0.9 % IV BOLUS (SEPSIS)
1000.0000 mL | Freq: Once | INTRAVENOUS | Status: AC
  Administered 2016-10-22: 1000 mL via INTRAVENOUS

## 2016-10-22 MED ORDER — SODIUM CHLORIDE 0.9 % IV BOLUS (SEPSIS)
250.0000 mL | Freq: Once | INTRAVENOUS | Status: AC
  Administered 2016-10-22: 250 mL via INTRAVENOUS

## 2016-10-22 NOTE — ED Provider Notes (Signed)
[facility] DEPT Provider Note   CSN: [id number] Arrival date & time: 10/10/2016  2320  By signing my name below, I, Oleh Genin, attest that this documentation has been prepared under the direction and in the presence of Merryl Hacker, MD. Electronically Signed: Oleh Genin, Scribe. 10/14/2016. 11:31 PM.   History   Chief Complaint Chief Complaint  Patient presents with  . Respiratory Distress    HPI Mario Andrews is a 56 y.o. male with history of COPD, Hep C, CVA, and epilepsy who presents to the ED for evaluation in respiratory distress. According to medic report, this patient was seen at this facility yesterday with complaints of dyspnea in the setting of objective fever and tachycardia. He was found with RUL pneumonia and offered admission; but instead elected to leave Against Medical Advice. He re-presents to this facility continuing to report dyspnea at home which was intolerable. Reports taking levaquin as directed.  With medic, he is initially hypoxic with RA sats the mid-80s. Placed on CPAP. Given 678m normal saline. No other pre-hospital therapy. He is a tobacco user. He does report a fever, and is febrile with medic. Denies any chest pain at interview. He believes his dyspnea has improved on CPAP.  The history is provided by the patient. No language interpreter was used.    Past Medical History:  Diagnosis Date  . Alcohol abuse   . Anxiety   . Cocaine abuse   . [place]   . Depression   . Hepatitis C   . [place]    [place] seizure 2 months ago  . [facility]    [facility]    Patient Active Problem List   Diagnosis Date Noted  . Snake bite 05/15/2014  . Cocaine abuse 08/29/2013  . Anxiety state, unspecified 08/29/2013  . MDD (major depressive disorder), recurrent episode ([place] 03/17/2012    Class: Acute  . Alcohol abuse, continuous 02/21/2012    Class: Acute  . [place] dependence [place] 02/14/2012    Past  Surgical History:  Procedure Laterality Date  . NO PAST SURGERIES         Home Medications    Prior to Admission medications   Medication Sig Start Date End Date Taking? Authorizing Provider  levofloxacin (LEVAQUIN) 750 MG tablet Take 1 tablet (750 mg total) by mouth daily. 10/21/16   LLarene Pickett PA-C  nicotine (NICODERM CQ - DOSED IN MG/24 HOURS) 21 mg/24hr patch Place 1 patch (21 mg total) onto the skin daily. Patient not taking: Reported on 10/20/2016 03/21/16   SKerrie Buffalo NP  predniSONE (DELTASONE) 20 MG tablet Take 40 mg by mouth daily for 3 days, then '20mg'$  by mouth daily for 3 days, then '10mg'$  daily for 3 days 10/21/16   LLarene Pickett PA-C    Family History Family History  Problem Relation Age of Onset  . Cancer Mother   . Stroke Father   . Cancer Father     Social History Social History  Substance Use Topics  . Smoking status: Current Every Day Smoker    Packs/day: 0.50    Years: 18.00    Types: Cigarettes  . Smokeless tobacco: Never Used  . Alcohol use Yes     Comment: pt states 2 quarts daily     Allergies   Peanut-containing drug products and Tramadol   Review of Systems Review of Systems  Unable to perform ROS: Acuity of condition  Constitutional: Positive for fever.  Respiratory: Positive  for shortness of breath.   Cardiovascular: Negative for chest pain.  All other systems reviewed and are negative.    Physical Exam Updated Vital Signs BP 121/79   Pulse 110   Temp 97.9 F (36.6 C) (Axillary)   Resp 26   Wt 145 lb (65.8 kg)   SpO2 100%   BMI 19.13 kg/m   Physical Exam  Constitutional: He is oriented to person, place, and time.  Ill-appearing, tachypnea With increased work of breathing  HENT:  Head: Normocephalic and atraumatic.  Cardiovascular: Regular rhythm and normal heart sounds.   No murmur heard. Tachycardia  Pulmonary/Chest: He is in respiratory distress. He has no wheezes.  Increased work of breathing, breathing  greater than 60 times per minute, diminished breath sounds right lung with crackles in [place] in the bases, wheezing noted on expiration  Abdominal: Soft. Bowel sounds are normal. There is no tenderness. There is no rebound.  Musculoskeletal: He exhibits edema.  1+ bilateral lower extremity edema  Neurological: He is alert and oriented to person, place, and time.  Skin: Skin is warm and dry.  Psychiatric: He has a normal mood and affect.  Nursing note and vitals reviewed.    ED Treatments / Results  Labs (all labs ordered are listed, but only abnormal results are displayed) Labs Reviewed  COMPREHENSIVE METABOLIC PANEL - Abnormal; Notable for the following:       Result Value   Sodium 133 (*)    Creatinine, Ser 0.56 (*)    Calcium 7.6 (*)    Albumin 1.6 (*)    AST 57 (*)    Alkaline Phosphatase 128 (*)    All other components within normal limits  CBC WITH DIFFERENTIAL/PLATELET - Abnormal; Notable for the following:    WBC 26.1 (*)    RBC 3.29 (*)    Hemoglobin 10.3 (*)    HCT 29.6 (*)    Neutro Abs 22.7 (*)    Monocytes Absolute 1.6 (*)    All other components within normal limits  CULTURE, BLOOD (ROUTINE X 2)  CULTURE, BLOOD (ROUTINE X 2)  URINALYSIS, ROUTINE W REFLEX MICROSCOPIC  INFLUENZA PANEL BY PCR (TYPE A & B)  TRIGLYCERIDES  I-STAT CG4 LACTIC ACID, ED  I-STAT ARTERIAL BLOOD GAS, ED    EKG  EKG Interpretation  Date/Time:  Sunday October 22 2016 23:26:35 EST Ventricular Rate:  106 PR Interval:    QRS Duration: 86 QT Interval:  327 QTC Calculation: 435 R Axis:   73 Text Interpretation:  Sinus tachycardia RSR' in V1 or V2, probably normal variant Confirmed by HORTON  MD, COURTNEY ([id number]) on 10/11/2016 11:45:44 PM       Radiology Dg Chest Port 1 View  Result Date: 09/29/2016 CLINICAL DATA:  Shortness of breath. EXAM: PORTABLE CHEST 1 VIEW COMPARISON:  Radiographs 2 days prior 10/20/2016 FINDINGS: Progressive opacification of the right hemithorax, with  grossly unchanged dense consolidation in the right upper lung. There is new volume loss and opacification at the lung base with probable pleural fluid. Left lung is clear. Heart size and mediastinal contours are unchanged. No evidence pneumothorax. IMPRESSION: Increased opacification throughout the right hemithorax since exam 2 days prior, now with new volume loss, right lung base opacity and probable effusion. This may be progressive right lung pneumonia, with the dense upper lung airspace opacity appearing similar to prior exam. Continued radiographic follow-up is needed to exclude underlying neoplasm. Electronically Signed   By: Jeb Levering M.D.   On: 10/19/2016 23:48  Procedures Procedures (including critical care time)  CRITICAL CARE Performed by: Merryl Hacker   Total critical care time: 60 minutes  Critical care time was exclusive of separately billable procedures and treating other patients.  Critical care was necessary to treat or prevent imminent or life-threatening deterioration.  Critical care was time spent personally by me on the following activities: development of treatment plan with patient and/or surrogate as well as nursing, discussions with consultants, evaluation of patient's response to treatment, examination of patient, obtaining history from patient or surrogate, ordering and performing treatments and interventions, ordering and review of laboratory studies, ordering and review of radiographic studies, pulse oximetry and re-evaluation of patient's condition.   Medications Ordered in ED Medications  methylPREDNISolone sodium succinate (SOLU-MEDROL) 125 mg/2 mL injection 125 mg (not administered)  etomidate (AMIDATE) injection (20 mg Intravenous Given 10/23/16 0102)  succinylcholine (ANECTINE) injection (150 mg Intravenous Given 10/23/16 0102)  propofol (DIPRIVAN) 1000 MG/100ML infusion (10 mcg/kg/min  65.8 kg Intravenous New Bag/Given 10/23/16 0115)  propofol  (DIPRIVAN) 1000 MG/100ML infusion (not administered)  sodium chloride 0.9 % bolus 1,000 mL (0 mLs Intravenous Stopped 10/23/16 0022)    And  sodium chloride 0.9 % bolus 1,000 mL (0 mLs Intravenous Stopped 10/23/16 0022)    And  sodium chloride 0.9 % bolus 250 mL (0 mLs Intravenous Stopped 10/07/2016 2336)  ipratropium-albuterol (DUONEB) 0.5-2.5 (3) MG/3ML nebulizer solution 3 mL (3 mLs Nebulization Given 10/16/2016 2335)  LORazepam (ATIVAN) injection 0.5 mg (0.5 mg Intravenous Given 10/01/2016 2335)  vancomycin (VANCOCIN) IVPB 1000 mg/200 mL premix (1,000 mg Intravenous New Bag/Given 10/23/16 0015)  ceFEPIme (MAXIPIME) 1 g in dextrose 5 % 50 mL IVPB (0 g Intravenous Stopped 10/23/16 0045)     Initial Impression / Assessment and Plan / ED Course  I have reviewed the triage vital signs and the nursing notes.  Pertinent labs & imaging results that were available during my care of the patient were reviewed by me and considered in my medical decision making (see chart for details).     Patient presents with worsening respiratory distress following being diagnosed with pneumonia yesterday. He refused admission yesterday. He is to [facility] and in acute respiratory distress. He was initially given a trial on BiPAP and was given some Ativan for comfort. He has persistent leukocytosis. Worsening chest x-ray with right sided effusion and volume loss with persistent pneumonia. Patient reports he has been taking his Levaquin.  He was worked up again for sepsis. He was given 30 mL/kg fluid and broadened to vancomycin and cefepime given his chest x-ray. Tachypnea improved only minimally on BiPAP and he continues to not tolerate BiPAP well. I discussed with the patient likelihood of his need for intubation. Patient states he is very uncomfortable and appears as such.  Patient intubated for respiratory distress. Critical care to admit.  Final Clinical Impressions(s) / ED Diagnoses   Final diagnoses:  Community acquired  pneumonia of right upper lobe of lung ([place])  Acute respiratory failure with hypoxia ([facility])    New Prescriptions New Prescriptions   No medications on file   I personally performed the services described in this documentation, which was scribed in my presence. The recorded information has been reviewed and is accurate.    Merryl Hacker, MD 10/23/16 [phone]

## 2016-10-23 ENCOUNTER — Inpatient Hospital Stay (HOSPITAL_COMMUNITY): Payer: Medicaid Other

## 2016-10-23 ENCOUNTER — Emergency Department (HOSPITAL_COMMUNITY): Payer: Medicaid Other

## 2016-10-23 DIAGNOSIS — F10239 Alcohol dependence with withdrawal, unspecified: Secondary | ICD-10-CM | POA: Diagnosis present

## 2016-10-23 DIAGNOSIS — J9811 Atelectasis: Secondary | ICD-10-CM | POA: Diagnosis present

## 2016-10-23 DIAGNOSIS — I959 Hypotension, unspecified: Secondary | ICD-10-CM

## 2016-10-23 DIAGNOSIS — R188 Other ascites: Secondary | ICD-10-CM | POA: Diagnosis present

## 2016-10-23 DIAGNOSIS — K7011 Alcoholic hepatitis with ascites: Secondary | ICD-10-CM | POA: Diagnosis not present

## 2016-10-23 DIAGNOSIS — R64 Cachexia: Secondary | ICD-10-CM | POA: Diagnosis present

## 2016-10-23 DIAGNOSIS — J860 Pyothorax with fistula: Secondary | ICD-10-CM | POA: Diagnosis present

## 2016-10-23 DIAGNOSIS — E43 Unspecified severe protein-calorie malnutrition: Secondary | ICD-10-CM | POA: Diagnosis present

## 2016-10-23 DIAGNOSIS — J9382 Other air leak: Secondary | ICD-10-CM | POA: Diagnosis present

## 2016-10-23 DIAGNOSIS — J9601 Acute respiratory failure with hypoxia: Secondary | ICD-10-CM

## 2016-10-23 DIAGNOSIS — J90 Pleural effusion, not elsewhere classified: Secondary | ICD-10-CM | POA: Diagnosis not present

## 2016-10-23 DIAGNOSIS — J810 Acute pulmonary edema: Secondary | ICD-10-CM | POA: Diagnosis not present

## 2016-10-23 DIAGNOSIS — R6521 Severe sepsis with septic shock: Secondary | ICD-10-CM | POA: Diagnosis present

## 2016-10-23 DIAGNOSIS — C3491 Malignant neoplasm of unspecified part of right bronchus or lung: Secondary | ICD-10-CM | POA: Diagnosis not present

## 2016-10-23 DIAGNOSIS — J189 Pneumonia, unspecified organism: Secondary | ICD-10-CM | POA: Diagnosis present

## 2016-10-23 DIAGNOSIS — Z8673 Personal history of transient ischemic attack (TIA), and cerebral infarction without residual deficits: Secondary | ICD-10-CM | POA: Diagnosis not present

## 2016-10-23 DIAGNOSIS — E440 Moderate protein-calorie malnutrition: Secondary | ICD-10-CM | POA: Diagnosis not present

## 2016-10-23 DIAGNOSIS — I313 Pericardial effusion (noninflammatory): Secondary | ICD-10-CM | POA: Diagnosis present

## 2016-10-23 DIAGNOSIS — J869 Pyothorax without fistula: Secondary | ICD-10-CM | POA: Diagnosis not present

## 2016-10-23 DIAGNOSIS — J440 Chronic obstructive pulmonary disease with acute lower respiratory infection: Secondary | ICD-10-CM | POA: Diagnosis present

## 2016-10-23 DIAGNOSIS — G40919 Epilepsy, unspecified, intractable, without status epilepticus: Secondary | ICD-10-CM | POA: Diagnosis present

## 2016-10-23 DIAGNOSIS — J181 Lobar pneumonia, unspecified organism: Secondary | ICD-10-CM | POA: Diagnosis not present

## 2016-10-23 DIAGNOSIS — J948 Other specified pleural conditions: Secondary | ICD-10-CM | POA: Diagnosis present

## 2016-10-23 DIAGNOSIS — E872 Acidosis: Secondary | ICD-10-CM | POA: Diagnosis present

## 2016-10-23 DIAGNOSIS — R569 Unspecified convulsions: Secondary | ICD-10-CM | POA: Diagnosis not present

## 2016-10-23 DIAGNOSIS — B1920 Unspecified viral hepatitis C without hepatic coma: Secondary | ICD-10-CM | POA: Diagnosis present

## 2016-10-23 DIAGNOSIS — K7031 Alcoholic cirrhosis of liver with ascites: Secondary | ICD-10-CM | POA: Diagnosis not present

## 2016-10-23 DIAGNOSIS — Z515 Encounter for palliative care: Secondary | ICD-10-CM | POA: Diagnosis not present

## 2016-10-23 DIAGNOSIS — Z888 Allergy status to other drugs, medicaments and biological substances status: Secondary | ICD-10-CM | POA: Diagnosis not present

## 2016-10-23 DIAGNOSIS — G9340 Encephalopathy, unspecified: Secondary | ICD-10-CM | POA: Diagnosis present

## 2016-10-23 DIAGNOSIS — A419 Sepsis, unspecified organism: Secondary | ICD-10-CM | POA: Diagnosis present

## 2016-10-23 DIAGNOSIS — C3411 Malignant neoplasm of upper lobe, right bronchus or lung: Secondary | ICD-10-CM | POA: Diagnosis present

## 2016-10-23 DIAGNOSIS — J811 Chronic pulmonary edema: Secondary | ICD-10-CM | POA: Diagnosis present

## 2016-10-23 DIAGNOSIS — I248 Other forms of acute ischemic heart disease: Secondary | ICD-10-CM | POA: Diagnosis present

## 2016-10-23 DIAGNOSIS — R140 Abdominal distension (gaseous): Secondary | ICD-10-CM | POA: Diagnosis not present

## 2016-10-23 LAB — TYPE AND SCREEN
ABO/RH(D): O POS
Antibody Screen: NEGATIVE

## 2016-10-23 LAB — COMPREHENSIVE METABOLIC PANEL
ALBUMIN: 1.6 g/dL — AB (ref 3.5–5.0)
ALT: 22 U/L (ref 17–63)
ALT: 22 U/L (ref 17–63)
AST: 57 U/L — ABNORMAL HIGH (ref 15–41)
AST: 57 U/L — AB (ref 15–41)
Albumin: 1.3 g/dL — ABNORMAL LOW (ref 3.5–5.0)
Alkaline Phosphatase: 107 U/L (ref 38–126)
Alkaline Phosphatase: 128 U/L — ABNORMAL HIGH (ref 38–126)
Anion gap: 8 (ref 5–15)
Anion gap: 8 (ref 5–15)
BUN: 14 mg/dL (ref 6–20)
BUN: 11 mg/dL (ref 6–20)
CHLORIDE: 101 mmol/L (ref 101–111)
CO2: 24 mmol/L (ref 22–32)
CO2: 25 mmol/L (ref 22–32)
Calcium: 7.6 mg/dL — ABNORMAL LOW (ref 8.9–10.3)
Calcium: 7.6 mg/dL — ABNORMAL LOW (ref 8.9–10.3)
Chloride: 103 mmol/L (ref 101–111)
Creatinine, Ser: 0.54 mg/dL — ABNORMAL LOW (ref 0.61–1.24)
Creatinine, Ser: 0.56 mg/dL — ABNORMAL LOW (ref 0.61–1.24)
GFR calc Af Amer: >60 mL/min (ref >60)
GFR calc Af Amer: >60 mL/min (ref >60)
GFR calc non Af Amer: >60 mL/min (ref >60)
Glucose, Bld: 143 mg/dL — ABNORMAL HIGH (ref 65–99)
Glucose, Bld: 81 mg/dL (ref 65–99)
POTASSIUM: 3.8 mmol/L (ref 3.5–5.1)
Potassium: 3.6 mmol/L (ref 3.5–5.1)
SODIUM: 133 mmol/L — AB (ref 135–145)
Sodium: 136 mmol/L (ref 135–145)
Total Bilirubin: 1.1 mg/dL (ref 0.3–1.2)
Total Bilirubin: 1.2 mg/dL (ref 0.3–1.2)
Total Protein: 5.6 g/dL — ABNORMAL LOW (ref 6.5–8.1)
Total Protein: 6.5 g/dL (ref 6.5–8.1)

## 2016-10-23 LAB — HEPATIC FUNCTION PANEL
ALT: 21 U/L (ref 17–63)
AST: 56 U/L — ABNORMAL HIGH (ref 15–41)
Albumin: 1.4 g/dL — ABNORMAL LOW (ref 3.5–5.0)
Alkaline Phosphatase: 104 U/L (ref 38–126)
BILIRUBIN INDIRECT: 0.9 mg/dL (ref 0.3–0.9)
BILIRUBIN TOTAL: 1.6 mg/dL — AB (ref 0.3–1.2)
Bilirubin, Direct: 0.7 mg/dL — ABNORMAL HIGH (ref 0.1–0.5)
Total Protein: 6.2 g/dL — ABNORMAL LOW (ref 6.5–8.1)

## 2016-10-23 LAB — RESPIRATORY PANEL BY PCR
Adenovirus: NOT DETECTED
BORDETELLA PERTUSSIS-RVPCR: NOT DETECTED
CHLAMYDOPHILA PNEUMONIAE-RVPPCR: NOT DETECTED
CORONAVIRUS HKU1-RVPPCR: NOT DETECTED
Coronavirus 229E: NOT DETECTED
Coronavirus NL63: NOT DETECTED
Coronavirus OC43: NOT DETECTED
INFLUENZA A-RVPPCR: NOT DETECTED
Influenza B: NOT DETECTED
Metapneumovirus: NOT DETECTED
Mycoplasma pneumoniae: NOT DETECTED
PARAINFLUENZA VIRUS 3-RVPPCR: NOT DETECTED
Parainfluenza Virus 1: NOT DETECTED
Parainfluenza Virus 2: NOT DETECTED
Parainfluenza Virus 4: NOT DETECTED
RHINOVIRUS / ENTEROVIRUS - RVPPCR: NOT DETECTED
Respiratory Syncytial Virus: NOT DETECTED

## 2016-10-23 LAB — BASIC METABOLIC PANEL
ANION GAP: 8 (ref 5–15)
BUN: 12 mg/dL (ref 6–20)
CALCIUM: 7.7 mg/dL — AB (ref 8.9–10.3)
CHLORIDE: 103 mmol/L (ref 101–111)
CO2: 23 mmol/L (ref 22–32)
CREATININE: 0.57 mg/dL — AB (ref 0.61–1.24)
GFR calc Af Amer: >60 mL/min (ref >60)
GFR calc non Af Amer: >60 mL/min (ref >60)
Glucose, Bld: 151 mg/dL — ABNORMAL HIGH (ref 65–99)
Potassium: 3.8 mmol/L (ref 3.5–5.1)
SODIUM: 134 mmol/L — AB (ref 135–145)

## 2016-10-23 LAB — CBC WITH DIFFERENTIAL/PLATELET
Basophils Absolute: 0 10*3/uL (ref 0.0–0.1)
Basophils Relative: 0 %
EOS PCT: 0 %
Eosinophils Absolute: 0 10*3/uL (ref 0.0–0.7)
HEMATOCRIT: 29.6 % — AB (ref 39.0–52.0)
HEMOGLOBIN: 10.3 g/dL — AB (ref 13.0–17.0)
LYMPHS ABS: 1.8 10*3/uL (ref 0.7–4.0)
Lymphocytes Relative: 7 %
MCH: 31.3 pg (ref 26.0–34.0)
MCHC: 34.8 g/dL (ref 30.0–36.0)
MCV: 90 fL (ref 78.0–100.0)
MONOS PCT: 6 %
Monocytes Absolute: 1.6 10*3/uL — ABNORMAL HIGH (ref 0.1–1.0)
Neutro Abs: 22.7 10*3/uL — ABNORMAL HIGH (ref 1.7–7.7)
Neutrophils Relative %: 87 %
Platelets: 226 10*3/uL (ref 150–400)
RBC: 3.29 MIL/uL — AB (ref 4.22–5.81)
RDW: 15.5 % (ref 11.5–15.5)
WBC: 26.1 10*3/uL — AB (ref 4.0–10.5)

## 2016-10-23 LAB — CBC
HCT: 30.6 % — ABNORMAL LOW (ref 39.0–52.0)
HEMATOCRIT: 31.8 % — AB (ref 39.0–52.0)
HEMOGLOBIN: 10.7 g/dL — AB (ref 13.0–17.0)
Hemoglobin: 10.4 g/dL — ABNORMAL LOW (ref 13.0–17.0)
MCH: 30.1 pg (ref 26.0–34.0)
MCH: 30.4 pg (ref 26.0–34.0)
MCHC: 33.6 g/dL (ref 30.0–36.0)
MCHC: 34 g/dL (ref 30.0–36.0)
MCV: 89.6 fL (ref 78.0–100.0)
MCV: 89.5 fL (ref 78.0–100.0)
Platelets: 175 10*3/uL (ref 150–400)
Platelets: 178 10*3/uL (ref 150–400)
RBC: 3.55 MIL/uL — ABNORMAL LOW (ref 4.22–5.81)
RBC: 3.42 MIL/uL — ABNORMAL LOW (ref 4.22–5.81)
RDW: 15.2 % (ref 11.5–15.5)
RDW: 15.4 % (ref 11.5–15.5)
WBC: 23.3 10*3/uL — AB (ref 4.0–10.5)
WBC: 18.3 10*3/uL — ABNORMAL HIGH (ref 4.0–10.5)

## 2016-10-23 LAB — GLUCOSE, CAPILLARY
GLUCOSE-CAPILLARY: 159 mg/dL — AB (ref 65–99)
GLUCOSE-CAPILLARY: 143 mg/dL — AB (ref 65–99)
Glucose-Capillary: 129 mg/dL — ABNORMAL HIGH (ref 65–99)
Glucose-Capillary: 139 mg/dL — ABNORMAL HIGH (ref 65–99)
Glucose-Capillary: 137 mg/dL — ABNORMAL HIGH (ref 65–99)

## 2016-10-23 LAB — STREP PNEUMONIAE URINARY ANTIGEN: STREP PNEUMO URINARY ANTIGEN: NEGATIVE

## 2016-10-23 LAB — BLOOD GAS, ARTERIAL
Acid-Base Excess: 1.9 mmol/L (ref 0.0–2.0)
Bicarbonate: 24.2 mmol/L (ref 20.0–28.0)
Drawn by: 398991
FIO2: 40
MECHVT: 560 mL
O2 Saturation: 93.7 %
PEEP: 5 cmH2O
Patient temperature: 98.6
RATE: 26 resp/min
pCO2 arterial: 27.3 mmHg — ABNORMAL LOW (ref 32.0–48.0)
pH, Arterial: 7.556 — ABNORMAL HIGH (ref 7.350–7.450)
pO2, Arterial: 66.5 mmHg — ABNORMAL LOW (ref 83.0–108.0)

## 2016-10-23 LAB — INFLUENZA PANEL BY PCR (TYPE A & B)
Influenza A By PCR: NEGATIVE
Influenza B By PCR: NEGATIVE

## 2016-10-23 LAB — PHOSPHORUS
PHOSPHORUS: 3.2 mg/dL (ref 2.5–4.6)
PHOSPHORUS: 2.5 mg/dL (ref 2.5–4.6)

## 2016-10-23 LAB — TROPONIN I
TROPONIN I: 0.03 ng/mL — AB (ref <0.03)
Troponin I: <0.03 ng/mL (ref <0.03)

## 2016-10-23 LAB — I-STAT ARTERIAL BLOOD GAS, ED
Bicarbonate: 24.8 mmol/L (ref 20.0–28.0)
O2 Saturation: 100 %
PCO2 ART: 39.1 mmHg (ref 32.0–48.0)
PH ART: 7.409 (ref 7.350–7.450)
PO2 ART: 209 mmHg — AB (ref 83.0–108.0)
Patient temperature: 98.6
TCO2: 26 mmol/L (ref 0–100)

## 2016-10-23 LAB — MAGNESIUM
Magnesium: 1.9 mg/dL (ref 1.7–2.4)
Magnesium: 2.1 mg/dL (ref 1.7–2.4)

## 2016-10-23 LAB — ABO/RH: ABO/RH(D): O POS

## 2016-10-23 LAB — AMMONIA: Ammonia: 44 umol/L — ABNORMAL HIGH (ref 9–35)

## 2016-10-23 LAB — MRSA PCR SCREENING: MRSA by PCR: NEGATIVE

## 2016-10-23 LAB — PROTIME-INR
INR: 1.57
Prothrombin Time: 18.9 seconds — ABNORMAL HIGH (ref 11.4–15.2)

## 2016-10-23 LAB — TRIGLYCERIDES: TRIGLYCERIDES: 77 mg/dL (ref <150)

## 2016-10-23 LAB — PROCALCITONIN: Procalcitonin: 0.55 ng/mL

## 2016-10-23 LAB — APTT: aPTT: 39 seconds — ABNORMAL HIGH (ref 24–36)

## 2016-10-23 LAB — CORTISOL: Cortisol, Plasma: 9.7 ug/dL

## 2016-10-23 MED ORDER — SUCCINYLCHOLINE CHLORIDE 20 MG/ML IJ SOLN
INTRAMUSCULAR | Status: DC | PRN
  Administered 2016-10-23: 150 mg via INTRAVENOUS

## 2016-10-23 MED ORDER — METHYLPREDNISOLONE SODIUM SUCC 125 MG IJ SOLR
125.0000 mg | Freq: Once | INTRAMUSCULAR | Status: AC
Start: 2016-10-23 — End: 2016-10-23
  Administered 2016-10-23: 125 mg via INTRAVENOUS
  Filled 2016-10-23: qty 2

## 2016-10-23 MED ORDER — MIDAZOLAM HCL 2 MG/2ML IJ SOLN: 2.0000 mg | INTRAMUSCULAR | Status: DC | PRN

## 2016-10-23 MED ORDER — SODIUM CHLORIDE 0.9 % IV SOLN: 250.0000 mL | INTRAVENOUS | Status: DC | PRN

## 2016-10-23 MED ORDER — DEXTROSE 5 % IV SOLN
500.0000 mg | Freq: Every day | INTRAVENOUS | Status: DC
  Administered 2016-10-23 – 2016-10-25 (×3): 500 mg via INTRAVENOUS
  Filled 2016-10-23 (×3): qty 500

## 2016-10-23 MED ORDER — DEXTROSE 5 % IV SOLN
2.0000 g | Freq: Every day | INTRAVENOUS | Status: DC
  Administered 2016-10-23 – 2016-11-01 (×10): 2 g via INTRAVENOUS
  Filled 2016-10-23 (×11): qty 2

## 2016-10-23 MED ORDER — LACTATED RINGERS IV BOLUS (SEPSIS)
1000.0000 mL | Freq: Once | INTRAVENOUS | Status: AC
  Administered 2016-10-23: 1000 mL via INTRAVENOUS

## 2016-10-23 MED ORDER — PRO-STAT SUGAR FREE PO LIQD
30.0000 mL | Freq: Two times a day (BID) | ORAL | Status: DC
  Administered 2016-10-23: 30 mL
  Filled 2016-10-23: qty 30

## 2016-10-23 MED ORDER — MIDAZOLAM HCL 2 MG/2ML IJ SOLN
2.0000 mg | INTRAMUSCULAR | Status: DC | PRN
  Administered 2016-10-23: 2 mg via INTRAVENOUS
  Filled 2016-10-23: qty 2

## 2016-10-23 MED ORDER — ONDANSETRON HCL 4 MG/2ML IJ SOLN: 4.0000 mg | Freq: Four times a day (QID) | INTRAMUSCULAR | Status: DC | PRN

## 2016-10-23 MED ORDER — OSELTAMIVIR PHOSPHATE 75 MG PO CAPS
75.0000 mg | ORAL_CAPSULE | Freq: Two times a day (BID) | ORAL | Status: DC
  Administered 2016-10-23: 75 mg via ORAL
  Filled 2016-10-23 (×2): qty 1

## 2016-10-23 MED ORDER — ORAL CARE MOUTH RINSE: 15.0000 mL | OROMUCOSAL | Status: DC

## 2016-10-23 MED ORDER — FENTANYL CITRATE (PF) 100 MCG/2ML IJ SOLN
25.0000 ug | INTRAMUSCULAR | Status: DC | PRN
  Administered 2016-10-23 – 2016-11-01 (×19): 50 ug via INTRAVENOUS
  Filled 2016-10-23 (×17): qty 2

## 2016-10-23 MED ORDER — FENTANYL CITRATE (PF) 100 MCG/2ML IJ SOLN
50.0000 ug | INTRAMUSCULAR | Status: DC | PRN
  Filled 2016-10-23: qty 2

## 2016-10-23 MED ORDER — DOCUSATE SODIUM 50 MG/5ML PO LIQD: 100.0000 mg | Freq: Two times a day (BID) | ORAL | Status: DC | PRN

## 2016-10-23 MED ORDER — ACETAMINOPHEN 325 MG PO TABS: 650.0000 mg | ORAL_TABLET | ORAL | Status: DC | PRN

## 2016-10-23 MED ORDER — LACTATED RINGERS IV SOLN
INTRAVENOUS | Status: DC
  Administered 2016-10-24: 10 mL/h via INTRAVENOUS
  Administered 2016-10-25 – 2016-11-01 (×3): via INTRAVENOUS

## 2016-10-23 MED ORDER — PROPOFOL 1000 MG/100ML IV EMUL
0.0000 ug/kg/min | INTRAVENOUS | Status: DC
  Administered 2016-10-23: 20 ug/kg/min via INTRAVENOUS
  Administered 2016-10-23: 45 ug/kg/min via INTRAVENOUS
  Administered 2016-10-23: 30 ug/kg/min via INTRAVENOUS
  Administered 2016-10-24 – 2016-10-26 (×7): 20 ug/kg/min via INTRAVENOUS
  Administered 2016-10-27 (×2): 30 ug/kg/min via INTRAVENOUS
  Filled 2016-10-23 (×12): qty 100

## 2016-10-23 MED ORDER — PROPOFOL 1000 MG/100ML IV EMUL
0.0000 ug/kg/min | INTRAVENOUS | Status: DC
  Administered 2016-10-23: 10 ug/kg/min via INTRAVENOUS

## 2016-10-23 MED ORDER — ADULT MULTIVITAMIN LIQUID CH
15.0000 mL | Freq: Every day | ORAL | Status: DC
  Administered 2016-10-24 – 2016-11-02 (×9): 15 mL
  Filled 2016-10-23 (×9): qty 15

## 2016-10-23 MED ORDER — FAMOTIDINE IN NACL 20-0.9 MG/50ML-% IV SOLN
20.0000 mg | Freq: Two times a day (BID) | INTRAVENOUS | Status: DC
  Administered 2016-10-23 – 2016-11-02 (×22): 20 mg via INTRAVENOUS
  Filled 2016-10-23 (×23): qty 50

## 2016-10-23 MED ORDER — VITAL AF 1.2 CAL PO LIQD: 1000.0000 mL | ORAL | Status: DC

## 2016-10-23 MED ORDER — CHLORHEXIDINE GLUCONATE 0.12% ORAL RINSE (MEDLINE KIT): 15.0000 mL | Freq: Two times a day (BID) | OROMUCOSAL | Status: DC

## 2016-10-23 MED ORDER — ETOMIDATE 2 MG/ML IV SOLN
INTRAVENOUS | Status: DC | PRN
  Administered 2016-10-23: 20 mg via INTRAVENOUS

## 2016-10-23 MED ORDER — FENTANYL CITRATE (PF) 100 MCG/2ML IJ SOLN: 50.0000 ug | INTRAMUSCULAR | Status: DC | PRN

## 2016-10-23 MED ORDER — CHLORHEXIDINE GLUCONATE 0.12% ORAL RINSE (MEDLINE KIT)
15.0000 mL | Freq: Two times a day (BID) | OROMUCOSAL | Status: DC
  Administered 2016-10-23 – 2016-10-24 (×3): 15 mL via OROMUCOSAL

## 2016-10-23 MED ORDER — PROPOFOL 1000 MG/100ML IV EMUL
INTRAVENOUS | Status: AC
  Filled 2016-10-23: qty 100

## 2016-10-23 MED ORDER — ENOXAPARIN SODIUM 40 MG/0.4ML ~~LOC~~ SOLN
40.0000 mg | SUBCUTANEOUS | Status: DC
  Administered 2016-10-23: 40 mg via SUBCUTANEOUS
  Filled 2016-10-23: qty 0.4

## 2016-10-23 MED ORDER — MIDAZOLAM HCL 2 MG/2ML IJ SOLN
1.0000 mg | INTRAMUSCULAR | Status: DC | PRN
  Administered 2016-10-23 – 2016-10-26 (×9): 2 mg via INTRAVENOUS
  Filled 2016-10-23 (×2): qty 2
  Filled 2016-10-23 (×2): qty 4
  Filled 2016-10-23: qty 2
  Filled 2016-10-23: qty 4
  Filled 2016-10-23: qty 2

## 2016-10-23 MED ORDER — ORAL CARE MOUTH RINSE
15.0000 mL | Freq: Four times a day (QID) | OROMUCOSAL | Status: DC
  Administered 2016-10-23 – 2016-10-24 (×7): 15 mL via OROMUCOSAL

## 2016-10-23 MED ORDER — VITAL HIGH PROTEIN PO LIQD
1000.0000 mL | ORAL | Status: DC
  Administered 2016-10-23 (×2)
  Administered 2016-10-23: 1000 mL
  Administered 2016-10-23 (×2)

## 2016-10-23 NOTE — Care Management Note (Addendum)
Case Management Note  Patient Details  Name: Mario Andrews MRN: [id number] Date of Birth: 08/28/1961  Subjective/Objective:   Acute resp failure (intubated)  with hypoxemia secondary to severe CAP and Cirrhosis , has hx of ETOH and cocaine..  Failed outpatient oral therapy.  Brother was visiting this am, his name is Lyrick Lagrand [place] said that patient lives with bother Jorge Retz and sister [place] Neighbor but they are using (drugs) so he prefers for [place] to speak with him regarding patient.  He also said not to speak with Salvadore Dom the other sister.   Patient has [employer], he has no PCP. NCM will cont to follow for dc needs.     2/27 [place], BSN - s/p VATS Deocrticaiton, drain r empyema, conts on vent, may need ? Snf, chest tubes x 3 to suction, ? Lung ca.  CSW consulted.   NCM will cont to follow.                Action/Plan:   Expected Discharge Date:                  Expected Discharge Plan:     In-House Referral:     Discharge planning Services  CM Consult  Post Acute Care Choice:    Choice offered to:     DME Arranged:    DME Agency:     HH Arranged:    HH Agency:     Status of Service:  In process, will continue to follow  If discussed at Long Length of Stay Meetings, dates discussed:    Additional Comments:  Zenon Mayo, RN 10/23/2016, 11:43 AM

## 2016-10-23 NOTE — Progress Notes (Signed)
RT note: sputum sample obtained and sent down to main lab without complications.

## 2016-10-23 NOTE — Progress Notes (Signed)
Initial Nutrition Assessment  DOCUMENTATION CODES:   Non-severe (moderate) malnutrition in context of chronic illness  INTERVENTION:  Initiate TF via OGT with Vital AF 1.2 at goal rate of 60 ml/h (1440 ml per day) to provide 1728 kcals, 108 gm protein, 1166 ml free water daily. TF plus current rate of Propofol will provide 103% of energyneeds.  Provide multivitamin via tube daily   NUTRITION DIAGNOSIS:   Inadequate oral intake related to inability to eat as evidenced by NPO status.   GOAL:   Patient will meet greater than or equal to 90% of their needs   MONITOR:   Vent status, TF tolerance, Weight trends, I & O's, Skin, Labs  REASON FOR ASSESSMENT:   Consult, Ventilator Enteral/tube feeding initiation and management  ASSESSMENT:   56 year old male with a history of tobacco abuse, snake bite, cocaine abuse, hepatitis C came to the [facility] emergency department on 10/11/2016 complaining of shortness of breath. He had been seen one day prior for pneumonia and it was recommended that he be admitted for further treatment but he left AGAINST MEDICAL ADVICE. He had been prescribed Levaquin. He took the antibiotics, but he stated that his dyspnea progressed so he called 911. In the emergency department his breathing improved briefly with the application of positive pressure ventilation but his respiratory status continued to decline so he was intubated by the emergency room physicians.  Patient is currently intubated on ventilator support MV: 14.2 L/min Temp (24hrs), Avg:96.8 F (36 C), Min:95.4 F (35.2 C), Max:97.9 F (36.6 C)  Propofol: 7.9 ml/hr (provides 208 kcal per 24 hours)  Pt has OGT with Vital High Protein infusing at 40 ml/hr which was started at 0500 hr this morning per nursing notes. Current TF provides 960 kcal and 84 grams of protein. Pt with bair-huggar blanket on. Nutrition-focused physical exam findings are moderate fat wasting of ribs and moderate muscle  wasting of temples with mild muscle wasting of thighs, clavicles, and acromion region. Pt meets criteria for moderate malnutrition based on wasting; chronic based on mild weight loss over 16 months.  Per MD note, pt has liver cirrhosis and small amount of ascites.   Labs: low albumin, low calcium, low hemoglobin   Diet Order:  Diet NPO time specified  Skin:  Reviewed, no issues  Last BM:  PTA  Height:   Ht Readings from Last 1 Encounters:  10/23/16 '5\' 11"'$  (1.803 m)    Weight:   Wt Readings from Last 1 Encounters:  10/23/16 146 lb 13.2 oz (66.6 kg)    Ideal Body Weight:  78.18 kg  BMI:  Body mass index is 20.48 kg/m.  Estimated Nutritional Needs:   Kcal:  1878  Protein:  100-120 grams  Fluid:  2.2 L/day  EDUCATION NEEDS:   No education needs identified at this time  Scarlette Ar RD, LDN, CSP Inpatient Clinical Dietitian Pager: [phone] After Hours Pager: [phone]

## 2016-10-23 NOTE — Progress Notes (Signed)
eLink Physician-Brief Progress Note Patient Name: Mario Andrews DOB: 1961-07-28 MRN: [id number]   Date of Service  10/23/2016  HPI/Events of Note  Called by radiology >> CXR shows  R necrotizing pneumonia and consolidation. Patient is already on Vancomycin, Rocephin and Azithromycin.   eICU Interventions  Continue present management.      Intervention Category Intermediate Interventions: Diagnostic test evaluation  Ramey Schiff Eugene 10/23/2016, 5:03 AM

## 2016-10-23 NOTE — Progress Notes (Signed)
CRITICAL VALUE ALERT  Critical value received: Troponin 0.03  Date of notification:  2/26  Time of notification:  1352   Critical value read back:yes  Nurse who received alert:  Rick Duff  MD notified (1st page):  Ashok Cordia  Time of first page:  1353  Responding MD:  Ashok Cordia   Time MD responded:  [phone]

## 2016-10-23 NOTE — Progress Notes (Signed)
eLink Physician-Brief Progress Note Patient Name: MARSALIS BEAULIEU DOB: [birth year] MRN: [id number]   Date of Service  10/23/2016  HPI/Events of Note  Oliguria - request for Foley Catheter.   eICU Interventions  Will order: 1. Place Foley Catheter.     Intervention Category Intermediate Interventions: Oliguria - evaluation and management  Kathaleen Dudziak Eugene 10/23/2016, 6:52 AM

## 2016-10-23 NOTE — H&P (Addendum)
PULMONARY / CRITICAL CARE MEDICINE   Name: Mario Andrews MRN: [id number] DOB: [birth year]    ADMISSION DATE:  10/23/2016 CONSULTATION DATE:  10/23/2016  REFERRING MD:  Dr. Dina Rich  CHIEF COMPLAINT:  Shortness of breath  HISTORY OF PRESENT ILLNESS:   56 year old male with a history of tobacco abuse, snake bite, cocaine abuse, hepatitis C came to the [facility] emergency department on 10/18/2016 complaining of shortness of breath. He had been seen one day prior for pneumonia and it was recommended that he be admitted for further treatment but he left AGAINST MEDICAL ADVICE. He had been prescribed Levaquin. He took the antibiotics, but he stated that his dyspnea progressed so he called 911. In the emergency department his breathing improved briefly with the application of positive pressure ventilation but his respiratory status continued to decline so he was intubated by the emergency room physicians around 1:30 AM. On my exam he was intubated and could not provide further history. History was obtained from chart review and talking to other healthcare providers.  PAST MEDICAL HISTORY :  He  has a past medical history of Alcohol abuse; Anxiety; Cocaine abuse; COPD (chronic obstructive pulmonary disease) ([place]); Depression; Hepatitis C; Seizures ([place]); and Stroke ([place]).  PAST SURGICAL HISTORY: He  has a past surgical history that includes No past surgeries.  Allergies  Allergen Reactions  . Peanut-Containing Drug Products Hives  . Tramadol Nausea Only    "upset stomach"    No current facility-administered medications on file prior to encounter.    Current Outpatient Prescriptions on File Prior to Encounter  Medication Sig  . levofloxacin (LEVAQUIN) 750 MG tablet Take 1 tablet (750 mg total) by mouth daily. (Patient taking differently: Take 750 mg by mouth daily. 7 day course)  . nicotine (NICODERM CQ - DOSED IN MG/24 HOURS) 21 mg/24hr patch Place 1 patch (21 mg total) onto the skin daily.  (Patient not taking: Reported on 10/20/2016)  . predniSONE (DELTASONE) 20 MG tablet Take 40 mg by mouth daily for 3 days, then '20mg'$  by mouth daily for 3 days, then '10mg'$  daily for 3 days (Patient taking differently: 10-40 mg See admin instructions. Tapered course ordered 10/21/16:Take  2 tablets (40 mg) by mouth daily for 3 days, then take 1 tablet ('20mg'$ ) by mouth daily for 3 days, then take 1/2 tablet ('10mg'$ ) daily for 3 days, then stop)    FAMILY HISTORY/SOCIAL HISTORY/REVIEW OF SYSTEMS:   Cannot obtain due to intubation.  SUBJECTIVE:  As above  VITAL SIGNS: BP 121/79   Pulse 110   Temp 97.9 F (36.6 C) (Axillary)   Resp 26   Wt 145 lb (65.8 kg)   SpO2 100%   BMI 19.13 kg/m   HEMODYNAMICS:    VENTILATOR SETTINGS: Vent Mode: PRVC FiO2 (%):  [40 %-100 %] 100 % Set Rate:  [26 bmp] 26 bmp PEEP:  [5 cmH20] 5 cmH20 Pressure Support:  [10 cmH20] 10 cmH20 Plateau Pressure:  [20 cmH20] 20 cmH20  INTAKE / OUTPUT: No intake/output data recorded.  PHYSICAL EXAMINATION: General:  Thin male, intubated Neuro:  Sedated, withdraws to pain in all 4 extremities HEENT:  Normocephalic atraumatic, oropharynx clear, endotracheal tube in place Cardiovascular:  Regular rate and rhythm no murmurs gallops or rubs no JVD Lungs:  Rhonchi bilaterally, ventilator supported breathing Abdomen:  Belly soft, mildly distended, no masses or rebound tenderness Musculoskeletal:  Normal bulk and tone Skin:  Edema in legs bilaterally, spider angiomata  LABS:  BMET  Recent Labs Lab 10/20/16  2344 10/21/2016 2330  NA 128* 133*  K 3.8 3.8  CL 92* 101  CO2 25 24  BUN 10 11  CREATININE 0.69 0.56*  GLUCOSE 107* 81    Electrolytes  Recent Labs Lab 10/20/16 2344 10/19/2016 2330  CALCIUM 7.9* 7.6*    CBC  Recent Labs Lab 10/20/16 2344 10/25/2016 2330  WBC 27.1* 26.1*  HGB 11.5* 10.3*  HCT 33.3* 29.6*  PLT 234 226    Coag's No results for input(s): APTT, INR in the last 168 hours.  Sepsis  Markers  Recent Labs Lab 10/21/16 0046 10/02/2016 2349  LATICACIDVEN 2.32* 1.54    ABG  Recent Labs Lab 10/03/2016 2346  PHART 7.426  PCO2ART 38.1  PO2ART 93.0    Liver Enzymes  Recent Labs Lab 10/20/16 2344 09/28/2016 2330  AST 53* 57*  ALT 18 22  ALKPHOS 144* 128*  BILITOT 1.6* 1.1  ALBUMIN 1.9* 1.6*    Cardiac Enzymes No results for input(s): TROPONINI, PROBNP in the last 168 hours.  Glucose No results for input(s): GLUCAP in the last 168 hours.  Imaging Dg Chest Portable 1 View  Result Date: 10/23/2016 CLINICAL DATA:  Intubation. EXAM: PORTABLE CHEST 1 VIEW COMPARISON:  2 hours prior. FINDINGS: Endotracheal tube is 26 mm from the carina. Enteric tube in place, tip below the diaphragm not included in the field of view. Progressive volume loss and opacification of the right lung from exam 2 hours prior. Left lung is clear. No pneumothorax. IMPRESSION: 1. Endotracheal tube 26 mm from the carina.  Enteric tube in place. 2. Worsening right lung aeration with increasing opacity and volume loss, may be worsening pneumonia, atelectasis or mucous plugging. There is a right pleural effusion. Electronically Signed   By: Jeb Levering M.D.   On: 10/23/2016 01:27   Dg Chest Port 1 View  Result Date: 09/28/2016 CLINICAL DATA:  Shortness of breath. EXAM: PORTABLE CHEST 1 VIEW COMPARISON:  Radiographs 2 days prior 10/20/2016 FINDINGS: Progressive opacification of the right hemithorax, with grossly unchanged dense consolidation in the right upper lung. There is new volume loss and opacification at the lung base with probable pleural fluid. Left lung is clear. Heart size and mediastinal contours are unchanged. No evidence pneumothorax. IMPRESSION: Increased opacification throughout the right hemithorax since exam 2 days prior, now with new volume loss, right lung base opacity and probable effusion. This may be progressive right lung pneumonia, with the dense upper lung airspace opacity  appearing similar to prior exam. Continued radiographic follow-up is needed to exclude underlying neoplasm. Electronically Signed   By: Jeb Levering M.D.   On: 10/10/2016 23:48     STUDIES:    CULTURES:  10/23/2016 blood culture 10/23/2016 influenza panel 10/23/2016 respiratory culture  ANTIBIOTICS:  10/23/2016 ceftriaxone 10/23/2016 azithromycin 10/23/2016 Tamiflu  SIGNIFICANT EVENTS:   LINES/TUBES:  10/23/2016 endotracheal tube  DISCUSSION:  56 year old male with a past medical history significant for snake bite, tobacco abuse, hepatitis C was admitted on 10/23/2016 with acute respiratory failure with hypoxemia secondary to severe community acquired pneumonia. He failed outpatient oral therapy. He appears to have cirrhosis based on his physical exam contributing to his illness. The differential diagnosis includes bacterial process versus a viral process like influenza.  ASSESSMENT / PLAN:  PULMONARY A:  Acute respiratory failure with hypoxemia Severe community acquired pneumonia CXR > atelectasis vs pleural effusion R P:   Full ventilatory support Ventilator associated pneumonia prevention protocol Daily wakeup assessment and spontaneous breathing trial ABG now Chest x-ray daily  CT chest Will need ultrasound chest 2/26  CARDIOVASCULAR A:  Sepsis, now with normal blood pressure and normal lactic acid P:  Telemetry monitoring Hold further IV fluids for now  RENAL A:   No acute issues P:   Monitor BMET and UOP Replace electrolytes as needed   GASTROINTESTINAL A:   Cirrhosis with possible ascites Hepatitis C P:   OG tube Repeat liver function test tomorrow morning Tube feedings per protocol  HEMATOLOGIC A:   No acute issues P:  Monitor for bleeding  INFECTIOUS A:   Severe community acquired pneumonia, failed outpatient Levaquin P:   Respiratory culture Continue ceftriaxone and azithromycin Follow-up blood cultures Start  Tamiflu Follow-up influenza panel  ENDOCRINE A:   No acute issues   P:   Monitor glucose  NEUROLOGIC A:   Sedation needs for ventilator synchrony P:   RASS goal:  -2 propofol infusion, as needed fentanyl, as needed Versed all per PAD protocol   FAMILY  - Updates: none bedside  - Inter-disciplinary family meet or Palliative Care meeting due by:  day 7  My critical care time caring for this patient 45 minutes  Roselie Awkward, MD [facility] Pager: [phone] Cell: [phone] After 3pm or if no response, call [phone]  10/23/2016, 2:17 AM

## 2016-10-23 NOTE — ED Notes (Addendum)
Placed condom cath per nurse.

## 2016-10-23 NOTE — Procedures (Signed)
Central Venous Catheter Insertion Procedure Note Mario Andrews [id number] May 09, 1961  Procedure: Insertion of Central Venous Catheter Indications: Drug and/or fluid administration  Procedure Details Consent: Risks of procedure as well as the alternatives and risks of each were explained to the (patient/caregiver).  Consent for procedure obtained. Time Out: Verified patient identification, verified procedure, site/side was marked, verified correct patient position, special equipment/implants available, medications/allergies/relevent history reviewed, required imaging and test results available.  Performed  Maximum sterile technique was used including antiseptics, cap, gloves, gown, hand hygiene, mask and sheet. Skin prep: Chlorhexidine; local anesthetic administered A antimicrobial bonded/coated triple lumen catheter was placed in the right internal jugular vein using the Seldinger technique. Ultrasound guidance used.Yes.   Catheter placed to 17 cm. Blood aspirated via all 3 ports and then flushed x 3. Line sutured x 2 and dressing applied.  Evaluation Blood flow good Complications: No apparent complications Patient did tolerate procedure well. Chest X-ray ordered to verify placement.  CXR: pending.  Georgann Housekeeper, AGACNP-BC [facility] Pulmonology/Critical Care Pager [phone] or [phone]  10/23/2016 3:25 PM

## 2016-10-23 NOTE — Progress Notes (Signed)
Patient ID: Mario Andrews, male   DOB: 1961-05-29, 56 y.o.   MRN: [id number]       [address]       [place] [address]             [phone]        [facility] L [facility] [place] Medical Record #[id number] Date of Birth: June 14, 1961  Referring: Dr Ashok Cordia  Primary Care: PROVIDER NOT IN SYSTEM  Chief Complaint:    Chief Complaint  Patient presents with  . Respiratory Distress    History of Present Illness:     Asked by Dr Ashok Cordia to see 56 year old male with a history of tobacco abuse,  cocaine abuse, hepatitis C. He was seen in  [facility] emergency department on 10/06/2016 complaining of shortness of breath. He had been seen on the 2/23  for pneumonia and it was recommended that he be admitted for further treatment but he left AGAINST MEDICAL ADVICE. He had been prescribed Levaquin. He took the antibiotics, but he stated that his dyspnea progressed so he called 911. In the emergency department he was intubated by the emergency room physicians around 1:30 AM 2/26 .  On my exam he was intubated and sedated and could not provide any history  history.   History was obtained from chart review and talking to other healthcare providers.   Current Activity/ Functional Status: unknown   Zubrod Score: At the time of surgery this patient's most appropriate activity status/level should be described as: _0     0    Normal activity, no symptoms _1     1    Restricted in physical strenuous activity but ambulatory, able to do out light work _2     2    Ambulatory and capable of self care, unable to do work activities, up and about                 more than 50%  Of the time                            _3     3    Only limited self care, in bed greater than 50% of waking hours _4     4    Completely disabled, no self care, confined to bed or chair _5     5    Moribund Unknown  Past Medical History:  Diagnosis Date  . Alcohol abuse   . Anxiety   . Cocaine abuse   . COPD (chronic  obstructive pulmonary disease) ([place])   . Depression   . Hepatitis C   . Seizures ([place])    last seizure 2 months ago  . Stroke [facility])    2009    Past Surgical History:  Procedure Laterality Date  . NO PAST SURGERIES      History  Smoking Status  . Current Every Day Smoker  . Packs/day: 0.50  . Years: 18.00  . Types: Cigarettes  Smokeless Tobacco  . Never Used    History  Alcohol Use  . Yes    Comment: pt states 2 quarts daily    Social History   Social History  . Marital status: Single    Spouse name: N/A  . Number of children: N/A  . Years of education: N/A   Occupational History  . Not on file.   Social History Main Topics  . Smoking status: Current Every Day Smoker  Packs/day: 0.50    Years: 18.00    Types: Cigarettes  . Smokeless tobacco: Never Used  . Alcohol use Yes     Comment: pt states 2 quarts daily  . Drug use: No     Comment: former  . Sexual activity: Yes    Birth control/ protection: Condom   Other Topics Concern  . Not on file   Social History Narrative  . No narrative on file    Allergies  Allergen Reactions  . Peanut-Containing Drug Products Hives  . Tramadol Nausea Only    "upset stomach"    Current Facility-Administered Medications  Medication Dose Route Frequency Provider Last Rate Last Dose  . 0.9 %  sodium chloride infusion  250 mL Intravenous PRN Juanito Doom, MD      . acetaminophen (TYLENOL) tablet 650 mg  650 mg Oral Q4H PRN Juanito Doom, MD      . azithromycin (ZITHROMAX) 500 mg in dextrose 5 % 250 mL IVPB  500 mg Intravenous Q0600 Juanito Doom, MD   500 mg at 10/23/16 0506  . cefTRIAXone (ROCEPHIN) 2 g in dextrose 5 % 50 mL IVPB  2 g Intravenous Q0600 Juanito Doom, MD   2 g at 10/23/16 6767  . chlorhexidine gluconate (MEDLINE KIT) (PERIDEX) 0.12 % solution 15 mL  15 mL Mouth Rinse BID Juanito Doom, MD   15 mL at 10/23/16 0800  . docusate (COLACE) 50 MG/5ML liquid 100 mg  100 mg Per Tube BID  PRN Juanito Doom, MD      . enoxaparin (LOVENOX) injection 40 mg  40 mg Subcutaneous Q24H Juanito Doom, MD   40 mg at 10/23/16 2094  . famotidine (PEPCID) IVPB 20 mg premix  20 mg Intravenous Q12H Juanito Doom, MD   20 mg at 10/23/16 0919  . feeding supplement (VITAL AF 1.2 CAL) liquid 1,000 mL  1,000 mL Per Tube Continuous Juanito Doom, MD 60 mL/hr at 10/23/16 1230 1,000 mL at 10/23/16 1230  . fentaNYL (SUBLIMAZE) injection 25-50 mcg  25-50 mcg Intravenous Q1H PRN Javier Glazier, MD   50 mcg at 10/23/16 0934  . MEDLINE mouth rinse  15 mL Mouth Rinse QID Juanito Doom, MD   15 mL at 10/23/16 1200  . midazolam (VERSED) injection 1-4 mg  1-4 mg Intravenous Q1H PRN Javier Glazier, MD   2 mg at 10/23/16 1204  . multivitamin liquid 15 mL  15 mL Per Tube Daily Juanito Doom, MD      . ondansetron [facility]) injection 4 mg  4 mg Intravenous Q6H PRN Juanito Doom, MD      . oseltamivir (TAMIFLU) capsule 75 mg  75 mg Oral BID Juanito Doom, MD   75 mg at 10/23/16 0919  . propofol (DIPRIVAN) 1000 MG/100ML infusion  0-50 mcg/kg/min Intravenous Continuous Juanito Doom, MD 7.9 mL/hr at 10/23/16 1200 20.01 mcg/kg/min at 10/23/16 1200    Prescriptions Prior to Admission  Medication Sig Dispense Refill Last Dose  . levofloxacin (LEVAQUIN) 750 MG tablet Take 1 tablet (750 mg total) by mouth daily. (Patient taking differently: Take 750 mg by mouth daily. 7 day course) 7 tablet 0 unknown  . nicotine (NICODERM CQ - DOSED IN MG/24 HOURS) 21 mg/24hr patch Place 1 patch (21 mg total) onto the skin daily. (Patient not taking: Reported on 10/20/2016) 28 patch 0 Not Taking at Unknown time  . predniSONE (DELTASONE) 20 MG tablet Take 40  mg by mouth daily for 3 days, then 33m by mouth daily for 3 days, then 135mdaily for 3 days (Patient taking differently: 10-40 mg See admin instructions. Tapered course ordered 10/21/16:Take  2 tablets (40 mg) by mouth daily for 3 days, then take 1  tablet (2062mby mouth daily for 3 days, then take 1/2 tablet (67m59maily for 3 days, then stop) 12 tablet 0     Family History  Problem Relation Age of Onset  . Cancer Mother   . Stroke Father   . Cancer Father      Review of Systems:  Chart reviewed     Cardiac Review of Systems: Y or N  Chest Pain [    ]  Resting SOB [  y ] Exertional SOB  [y  Blue.Reese Orthopnea [  ]   Pedal Edema [   ]    Palpitations [  ] Syncope  [  ]   Presyncope [   ]  General Review of Systems: [Y] = yes [  ]=no Constitional: recent weight change [  ]; anorexia [  ]; fatigue [  ]; nausea [  ]; night sweats [  ]; fever [  ]; or chills [  ]                                                               Dental: poor dentition[  ]; Last Dentist visit:   Eye : blurred vision [  ]; diplopia [   ]; vision changes [  ];  Amaurosis fugax[  ]; Resp: cough [  ];  wheezing[  ];  hemoptysis[  ]; shortness of breath[  ]; paroxysmal nocturnal dyspnea[  ]; dyspnea on exertion[  ]; or orthopnea[  ];  GI:  gallstones[  ], vomiting[  ];  dysphagia[  ]; melena[  ];  hematochezia [  ]; heartburn[  ];   Hx of  Colonoscopy[  ]; GU: kidney stones [  ]; hematuria[  ];   dysuria [  ];  nocturia[  ];  history of     obstruction [  ]; urinary frequency [  ]             Skin: rash, swelling[  ];, hair loss[  ];  peripheral edema[  ];  or itching[  ]; Musculosketetal: myalgias[  ];  joint swelling[  ];  joint erythema[  ];  joint pain[  ];  back pain[  ];  Heme/Lymph: bruising[  ];  bleeding[  ];  anemia[  ];  Neuro: TIA[  ];  headaches[  ];  stroke[  ];  vertigo[  ];  seizures[  ];   paresthesias[  ];  difficulty walking[  ];  Psych:depression[  ]; anxiety[  ];  Endocrine: diabetes[  ];  thyroid dysfunction[  ];  Immunizations: Flu [  ]; Pneumococcal[  ];  Other:  Physical Exam: BP (!) 89/55   Pulse 65   Temp (!) 95.4 F (35.2 C) (Rectal) Comment (Src): bair hugger  Resp (!) 8   Ht 5' 11" (1.803 m)   Wt 146 lb 13.2 oz (66.6 kg)    SpO2 96%   BMI 20.48 kg/m    General appearance: sedated on vent  Head: Normocephalic, without  obvious abnormality, atraumatic Neck: no adenopathy, no carotid bruit, no JVD, supple, symmetrical, trachea midline and thyroid not enlarged, symmetric, no tenderness/mass/nodules Lymph nodes: Cervical, supraclavicular, and axillary nodes normal. Resp: diminished breath sounds RLL, RML and RUL Back: negative, symmetric, no curvature. ROM normal. No CVA tenderness. Cardio: regular rate and rhythm, S1, S2 normal, no murmur, click, rub or gallop GI: soft, non-tender; bowel sounds normal; no masses,  no organomegaly Extremities: extremities normal, atraumatic, no cyanosis or edema and Homans sign is negative, no sign of DVT Neurologic: sedated on vent   Diagnostic Studies & Laboratory data:     Recent Radiology Findings:   Ct Chest Wo Contrast  Result Date: 10/23/2016 CLINICAL DATA:  Assess apparent enlarging right-sided pleural effusion. Initial encounter. EXAM: CT CHEST WITHOUT CONTRAST TECHNIQUE: Multidetector CT imaging of the chest was performed following the standard protocol without IV contrast. COMPARISON:  Chest radiograph performed earlier today at 11:49 a.m. FINDINGS: Cardiovascular: The heart is grossly normal in size. The thoracic aorta is unremarkable, though not well assessed without contrast. The great vessels are grossly unremarkable in appearance. Mediastinum/Nodes: A borderline prominent precarinal node is seen, measuring 1.1 cm in short axis. Remaining visualized mediastinal nodes are normal in size, though the right hilum and azygoesophageal recess are not well assessed without contrast. The patient's endotracheal tube is seen ending 4 cm above the carina. The enteric tube extends below the diaphragm, with trace fluid noted in the mid to distal esophagus. Trace pericardial fluid remains within normal limits. The visualized portions of the thyroid gland are unremarkable. No axillary  lymphadenopathy is seen. Lungs/Pleura: There is a complex consolidative process at the right lung apex, with pockets of air and fluid, most likely reflecting evolving necrotizing pneumonia with lung abscess. Alternatively, this could reflect an underlying necrotizing mass with superimposed infection. Air-fluid levels are noted in the remainder of the right hemithorax, reflecting an evolving large empyema. This may be due to a gas producing organism, given the pockets of air. There is complete collapse of most of the right lung. Mild left-sided atelectasis is noted. Emphysematous change is seen at the left lung apex. Upper Abdomen: The nodular contour of the liver is compatible with hepatic cirrhosis. Small volume ascites is seen tracking about the liver and spleen. Musculoskeletal: No acute osseous abnormalities are identified. The visualized musculature is unremarkable in appearance. IMPRESSION: 1. Complex consolidative process in the right lung apex, with pockets of air and fluid, most likely reflecting evolving necrotizing pneumonia with lung abscess. Alternatively, this could reflect an underlying necrotizing mass with superimposed infection. 2. Air-fluid levels in the remainder of the right hemithorax, reflecting an evolving large empyema. This may be due to a gas producing organism, given the pockets of air. Complete collapse of most of the right lung. 3. Mild left-sided atelectasis noted. Emphysematous change at the left lung apex. 4. Borderline prominent precarinal node, measuring 1.1 cm in short axis. Right hilum not well assessed. 5. Findings of hepatic cirrhosis, with small ascites at the upper abdomen. These results were called by telephone at the time of interpretation on 10/23/2016 at 4:07 am to Nursing at [facility], who verbally acknowledged these results. Electronically Signed   By: Garald Balding M.D.   On: 10/23/2016 04:08   Dg Chest Portable 1 View  Result Date: 10/23/2016 CLINICAL DATA:   Intubation. EXAM: PORTABLE CHEST 1 VIEW COMPARISON:  2 hours prior. FINDINGS: Endotracheal tube is 26 mm from the carina. Enteric tube in place, tip below the diaphragm not  included in the field of view. Progressive volume loss and opacification of the right lung from exam 2 hours prior. Left lung is clear. No pneumothorax. IMPRESSION: 1. Endotracheal tube 26 mm from the carina.  Enteric tube in place. 2. Worsening right lung aeration with increasing opacity and volume loss, may be worsening pneumonia, atelectasis or mucous plugging. There is a right pleural effusion. Electronically Signed   By: Jeb Levering M.D.   On: 10/23/2016 01:27   Dg Chest Port 1 View  Result Date: 10/16/2016 CLINICAL DATA:  Shortness of breath. EXAM: PORTABLE CHEST 1 VIEW COMPARISON:  Radiographs 2 days prior 10/20/2016 FINDINGS: Progressive opacification of the right hemithorax, with grossly unchanged dense consolidation in the right upper lung. There is new volume loss and opacification at the lung base with probable pleural fluid. Left lung is clear. Heart size and mediastinal contours are unchanged. No evidence pneumothorax. IMPRESSION: Increased opacification throughout the right hemithorax since exam 2 days prior, now with new volume loss, right lung base opacity and probable effusion. This may be progressive right lung pneumonia, with the dense upper lung airspace opacity appearing similar to prior exam. Continued radiographic follow-up is needed to exclude underlying neoplasm. Electronically Signed   By: Jeb Levering M.D.   On: 09/29/2016 23:48     I have independently reviewed the above radiologic studies.  Recent Lab Findings: Lab Results  Component Value Date   WBC 23.3 (H) 10/23/2016   HGB 10.7 (L) 10/23/2016   HCT 31.8 (L) 10/23/2016   PLT 175 10/23/2016   GLUCOSE 151 (H) 10/23/2016   TRIG 77 10/23/2016   ALT 21 10/23/2016   AST 56 (H) 10/23/2016   NA 134 (L) 10/23/2016   K 3.8 10/23/2016   CL 103  10/23/2016   CREATININE 0.57 (L) 10/23/2016   BUN 12 10/23/2016   CO2 23 10/23/2016   TSH 5.250 (H) 05/15/2014   INR 1.13 05/16/2014      Assessment / Plan:             1 acute hypoxic respiratory failure with severe sepsis due to Severe community acquired pneumonia 2 Right hydropneumothorax 3 Moderate malnutrition 4 polysubstance abuse 5 Liver cirrhosis with ascites: Known history of hepatitis C viral infection  Discussed with Dr Ashok Cordia- Will plan bronch and right VATS and drainage of right chest as best way to ensure adquate drainage  Risks and options discussed with brother.   Grace Isaac MD      [address] [place] [address] Office [phone]   Beeper [phone]  10/23/2016 1:55 PM

## 2016-10-23 NOTE — Progress Notes (Signed)
[facility] Attending Note  Presenting HPI:  56 y.o. male with a history of tobacco abuse, snake bite, cocaine abuse, hepatitis C came to the [facility] emergency department on 09/30/2016 complaining of shortness of breath. He had been seen one day prior for pneumonia and it was recommended that he be admitted for further treatment but he left AGAINST MEDICAL ADVICE. He had been prescribed Levaquin. He took the antibiotics, but he stated that his dyspnea progressed so he called 911. In the emergency department his breathing improved briefly with the application of positive pressure ventilation but his respiratory status continued to decline so he was intubated by the emergency room physicians around 1:30 AM. On my exam he was intubated and could not provide further history. History was obtained from chart review and talking to other healthcare providers.  Subjective:  Patient with borderline hypotension since admission. Required further bolus IV fluids.  Review of Systems:  Unable to obtain given intubation & sedation.   Vent Mode: PRVC FiO2 (%):  [40 %-100 %] 40 % Set Rate:  [26 bmp] 26 bmp Vt Set:  [560 mL] 560 mL PEEP:  [5 cmH20] 5 cmH20 Pressure Support:  [10 cmH20] 10 cmH20 Plateau Pressure:  [17 cmH20-20 cmH20] 18 cmH20  Temp:  [95.4 F (35.2 C)-97.9 F (36.6 C)] 95.4 F (35.2 C) (02/26 0800) Pulse Rate:  [61-110] 61 (02/26 0800) Resp:  [0-46] 26 (02/26 0900) BP: (76-134)/(54-99) 84/59 (02/26 0900) SpO2:  [93 %-100 %] 95 % (02/26 0900) FiO2 (%):  [40 %-100 %] 40 % (02/26 0800) Weight:  [145 lb (65.8 kg)-146 lb 13.2 oz (66.6 kg)] 146 lb 13.2 oz (66.6 kg) (02/26 0400)  General:  No family at bedside. Intubated. No distress. Integument:  Warm & diaphoretic. No rash on exposed skin. HEENT:  Moist mucus memebranes. No scleral icterus. Endotracheal tube in place. Neurological:  Pupils symmetric. Sedated. No spontaneous movements. Musculoskeletal:  No joint  effusion or erythema appreciated. Symmetric muscle bulk. Pulmonary:  Symmetric chest wall rise on ventilator. Coarse breath sounds bilaterally. Cardiovascular:  Regular rate & rhythm. No appreciable JVD. Bilateral lower extremity edema. Telemetry:  Sinus rhythm. Abdomen:  Soft. Nondistended. Normoactive bowel sounds.  LINES/TUBES: OETT 7.5 2/26 >>> Foley 2/26 >>> OGT 2/26 >>> PIV  CBC Latest Ref Rng & Units 10/23/2016 10/07/2016 10/20/2016  WBC 4.0 - 10.5 K/uL 23.3(H) 26.1(H) 27.1(H)  Hemoglobin 13.0 - 17.0 g/dL 10.7(L) 10.3(L) 11.5(L)  Hematocrit 39.0 - 52.0 % 31.8(L) 29.6(L) 33.3(L)  Platelets 150 - 400 K/uL 175 226 234   BMP Latest Ref Rng & Units 10/23/2016 10/09/2016 10/20/2016  Glucose 65 - 99 mg/dL 151(H) 81 107(H)  BUN 6 - 20 mg/dL _0 Creatinine 0.61 - 1.24 mg/dL 0.57(L) 0.56(L) 0.69  Sodium 135 - 145 mmol/L 134(L) 133(L) 128(L)  Potassium 3.5 - 5.1 mmol/L 3.8 3.8 3.8  Chloride 101 - 111 mmol/L 103 101 92(L)  CO2 22 - 32 mmol/L _1 Calcium 8.9 - 10.3 mg/dL 7.7(L) 7.6(L) 7.9(L)   Hepatic Function Latest Ref Rng & Units 10/23/2016 10/05/2016 10/20/2016  Total Protein 6.5 - 8.1 g/dL 6.2(L) 6.5 7.2  Albumin 3.5 - 5.0 g/dL 1.4(L) 1.6(L) 1.9(L)  AST 15 - 41 U/L 56(H) 57(H) 53(H)  ALT 17 - 63 U/L _2 Alk Phosphatase 38 - 126 U/L 104 128(H) 144(H)  Total Bilirubin 0.3 - 1.2 mg/dL 1.6(H) 1.1 1.6(H)  Bilirubin, Direct 0.1 - 0.5 mg/dL 0.7(H) - -  IMAGING/STUDIES: CT CHEST W/O 2/26:  Personally reviewed by me. Liver cirrhosis with a small amount of ascites. Enlarged precarinal lymph nodes. Emphysema noted predominantly apical on the left. Dense consolidation on the right with pockets and what seems to be air-fluid levels. Appears to have a pleural effusion on the right as well.  MICROBIOLOGY: HIV 1/2 2/23:  Nonreactive Blood Culture x1 2/24 >>> MRSA PCR 2/26:  Negative  Blood Cultures x2 2/25 >>> Tracheal Aspirate Culture 2/26 >>> Respiratory Panel PCR 2/26  >>> Influenza A/B PCR 2/26:  Negative  Urine Streptococcal Antigen 2/26 >>> Urine Legionella Antigen 2/26 >>>  ANTIBIOTICS: Cefepime 2/26 (1 dose) Vancomycin 2/26 (1 dose) Azithromycin 2/26 >>> Rocephin 2/26 >>> Tamiflu 2/26 >>> (stop date 3/3)  SIGNIFICANT EVENTS: 02/26 - Admit  ASSESSMENT/PLAN:  56 y.o. male with known history of hepatitis C and polysubstance abuse including tobacco and cocaine. Patient currently in acute hypoxic respiratory failure with severe sepsis. Does have hypertension that seems to be responsive to IV fluids. Patient does seem to have a dense consolidation on the right with what appears to be pneumonia and a possible hydropneumothorax. Patient was previously on Levaquin as an outpatient and failed therapy.  1. Severe community acquired pneumonia: Continuing broad-spectrum antibiotics with Tamiflu, Rocephin, and azithromycin. Checking respiratory viral panel PCR. Awaiting tracheal aspirate culture. Also checking urine streptococcal and legionella antigens. 2. Severe sepsis: Trending Procalcitonin per algorithm. Cultures pending. Continue broad-spectrum antibiotics. 3. Acute hypoxic respiratory failure: Secondary to severe community acquired pneumonia. Continuing full ventilator support. 4. Hypotension: Intermittent. Continuing IV fluid bolus resuscitation. Checking serum cortisol. Trending troponin I every 6 hours & checking complete echocardiogram. 5. Right hydropneumothorax: Plan to assess at bedside ultrasound. May require chest tube placement versus thoracic surgery evaluation for possible VATS and surgical chest tube placement. 6. Moderate malnutrition: Initiating tube feedings per dietary recommendations. 7. History of polysubstance abuse: Monitoring closely for signs of withdrawal. Continuing propofol for sedation along with when necessary fentanyl and Versed. 8. Liver cirrhosis with ascites: Known history of hepatitis C viral infection. Repeat hepatic function  panel in the morning.  Prophylaxis:  Pepcid IV q12hr, SCDs, & Lovenox [place] daily.  Diet:  NPO. Starting tube feedings per dietary recommendations. Code Status: Full code per previous physician discussions. Disposition: Remains critically ill and endotracheally intubated in the intensive care unit. Family Update:  No family at bedside during my rounds.  I have personally spent an additional 24 minutes of critical care time today caring for the patient & reviewing the aptient's electronic medical record.  Sonia Baller Ashok Cordia, M.D. [facility] Pager:  [phone] After 3pm or if no response, call [phone] 10:17 AM 10/23/16

## 2016-10-23 NOTE — Progress Notes (Signed)
Changes made per ABG results.

## 2016-10-23 NOTE — ED Provider Notes (Signed)
Procedure Name: Intubation Date/Time: 10/23/2016 1:46 AM Performed by: Abigail Butts Pre-anesthesia Checklist: Patient identified, Emergency Drugs available, Suction available, Patient being monitored and Timeout performed Oxygen Delivery Method: Ambu bag Preoxygenation: Pre-oxygenation with 100% oxygen Intubation Type: Rapid sequence Ventilation: Mask ventilation without difficulty Laryngoscope Size: Mac and 4 Tube size: 7.5 mm Number of attempts: 1 Airway Equipment and Method: Rigid stylet and Video-laryngoscopy Placement Confirmation: ETT inserted through vocal cords under direct vision,  Positive ETCO2,  CO2 detector and Breath sounds checked- equal and bilateral Secured at: 23 cm Tube secured with: ETT holder Dental Injury: Teeth and Oropharynx as per pre-operative assessment         Abigail Butts, PA-C 10/23/16 0147    Merryl Hacker, MD 10/23/16 [phone]

## 2016-10-23 NOTE — Progress Notes (Signed)
Pt has not voided via condom cath. Bladder scanned >957m. Elink notified, awaiting orders

## 2016-10-24 ENCOUNTER — Inpatient Hospital Stay (HOSPITAL_COMMUNITY): Payer: Medicaid Other | Admitting: Certified Registered"

## 2016-10-24 ENCOUNTER — Encounter (HOSPITAL_COMMUNITY): Admission: EM | Disposition: E | Payer: Self-pay | Source: Home / Self Care | Attending: Pulmonary Disease

## 2016-10-24 ENCOUNTER — Inpatient Hospital Stay (HOSPITAL_COMMUNITY): Payer: Medicaid Other

## 2016-10-24 DIAGNOSIS — R6521 Severe sepsis with septic shock: Secondary | ICD-10-CM

## 2016-10-24 DIAGNOSIS — J189 Pneumonia, unspecified organism: Secondary | ICD-10-CM

## 2016-10-24 DIAGNOSIS — J90 Pleural effusion, not elsewhere classified: Secondary | ICD-10-CM

## 2016-10-24 DIAGNOSIS — A419 Sepsis, unspecified organism: Principal | ICD-10-CM

## 2016-10-24 DIAGNOSIS — J869 Pyothorax without fistula: Secondary | ICD-10-CM

## 2016-10-24 DIAGNOSIS — E440 Moderate protein-calorie malnutrition: Secondary | ICD-10-CM | POA: Insufficient documentation

## 2016-10-24 HISTORY — PX: VIDEO ASSISTED THORACOSCOPY (VATS)/DECORTICATION: SHX6171

## 2016-10-24 HISTORY — PX: VIDEO BRONCHOSCOPY: SHX5072

## 2016-10-24 LAB — LEGIONELLA PNEUMOPHILA SEROGP 1 UR AG: L. pneumophila Serogp 1 Ur Ag: NEGATIVE

## 2016-10-24 LAB — POCT I-STAT 3, ART BLOOD GAS (G3+)
ACID-BASE EXCESS: 1 mmol/L (ref 0.0–2.0)
BICARBONATE: 23.8 mmol/L (ref 20.0–28.0)
O2 SAT: 92 %
Patient temperature: 94
TCO2: 25 mmol/L (ref 0–100)
pCO2 arterial: 29.2 mmHg — ABNORMAL LOW (ref 32.0–48.0)
pH, Arterial: 7.509 — ABNORMAL HIGH (ref 7.350–7.450)
pO2, Arterial: 50 mmHg — ABNORMAL LOW (ref 83.0–108.0)

## 2016-10-24 LAB — BASIC METABOLIC PANEL
Anion gap: 6 (ref 5–15)
Anion gap: 6 (ref 5–15)
BUN: 17 mg/dL (ref 6–20)
BUN: 18 mg/dL (ref 6–20)
CHLORIDE: 106 mmol/L (ref 101–111)
CO2: 26 mmol/L (ref 22–32)
CO2: 24 mmol/L (ref 22–32)
Calcium: 7.8 mg/dL — ABNORMAL LOW (ref 8.9–10.3)
Calcium: 7.9 mg/dL — ABNORMAL LOW (ref 8.9–10.3)
Chloride: 106 mmol/L (ref 101–111)
Creatinine, Ser: 0.49 mg/dL — ABNORMAL LOW (ref 0.61–1.24)
Creatinine, Ser: 0.52 mg/dL — ABNORMAL LOW (ref 0.61–1.24)
GFR calc Af Amer: >60 mL/min (ref >60)
GFR calc Af Amer: >60 mL/min (ref >60)
GFR calc non Af Amer: >60 mL/min (ref >60)
Glucose, Bld: 116 mg/dL — ABNORMAL HIGH (ref 65–99)
Glucose, Bld: 120 mg/dL — ABNORMAL HIGH (ref 65–99)
POTASSIUM: 3.5 mmol/L (ref 3.5–5.1)
Potassium: 3.7 mmol/L (ref 3.5–5.1)
Sodium: 138 mmol/L (ref 135–145)
Sodium: 136 mmol/L (ref 135–145)

## 2016-10-24 LAB — URINALYSIS, ROUTINE W REFLEX MICROSCOPIC
Bacteria, UA: NONE SEEN
Bilirubin Urine: NEGATIVE
Glucose, UA: NEGATIVE mg/dL
Ketones, ur: NEGATIVE mg/dL
Leukocytes, UA: NEGATIVE
Nitrite: NEGATIVE
Protein, ur: NEGATIVE mg/dL
Specific Gravity, Urine: 1.015 (ref 1.005–1.030)
Squamous Epithelial / LPF: NONE SEEN
pH: 7 (ref 5.0–8.0)

## 2016-10-24 LAB — CBC WITH DIFFERENTIAL/PLATELET
BASOS ABS: 0 10*3/uL (ref 0.0–0.1)
Basophils Relative: 0 %
EOS ABS: 0 10*3/uL (ref 0.0–0.7)
Eosinophils Relative: 0 %
HCT: 32.1 % — ABNORMAL LOW (ref 39.0–52.0)
Hemoglobin: 11.1 g/dL — ABNORMAL LOW (ref 13.0–17.0)
LYMPHS PCT: 8 %
Lymphs Abs: 1.9 10*3/uL (ref 0.7–4.0)
MCH: 30.7 pg (ref 26.0–34.0)
MCHC: 34.6 g/dL (ref 30.0–36.0)
MCV: 88.9 fL (ref 78.0–100.0)
MONO ABS: 1.2 10*3/uL — AB (ref 0.1–1.0)
Monocytes Relative: 5 %
NEUTROS PCT: 87 %
Neutro Abs: 20.3 10*3/uL — ABNORMAL HIGH (ref 1.7–7.7)
PLATELETS: 215 10*3/uL (ref 150–400)
RBC: 3.61 MIL/uL — AB (ref 4.22–5.81)
RDW: 15.3 % (ref 11.5–15.5)
WBC: 23.4 10*3/uL — AB (ref 4.0–10.5)

## 2016-10-24 LAB — BODY FLUID CELL COUNT WITH DIFFERENTIAL
Eos, Fluid: 0 %
Lymphs, Fluid: 0 %
Monocyte-Macrophage-Serous Fluid: 2 % — ABNORMAL LOW (ref 50–90)
Neutrophil Count, Fluid: 98 % — ABNORMAL HIGH (ref 0–25)
Total Nucleated Cell Count, Fluid: 28000 cu mm — ABNORMAL HIGH (ref 0–1000)

## 2016-10-24 LAB — GLUCOSE, CAPILLARY
GLUCOSE-CAPILLARY: 108 mg/dL — AB (ref 65–99)
GLUCOSE-CAPILLARY: 132 mg/dL — AB (ref 65–99)
GLUCOSE-CAPILLARY: 132 mg/dL — AB (ref 65–99)
Glucose-Capillary: 121 mg/dL — ABNORMAL HIGH (ref 65–99)
Glucose-Capillary: 100 mg/dL — ABNORMAL HIGH (ref 65–99)
Glucose-Capillary: 123 mg/dL — ABNORMAL HIGH (ref 65–99)

## 2016-10-24 LAB — HEPATIC FUNCTION PANEL
ALBUMIN: 1.4 g/dL — AB (ref 3.5–5.0)
ALT: 24 U/L (ref 17–63)
AST: 54 U/L — AB (ref 15–41)
Alkaline Phosphatase: 117 U/L (ref 38–126)
BILIRUBIN TOTAL: 1.1 mg/dL (ref 0.3–1.2)
Bilirubin, Direct: 0.5 mg/dL (ref 0.1–0.5)
Indirect Bilirubin: 0.6 mg/dL (ref 0.3–0.9)
TOTAL PROTEIN: 6.2 g/dL — AB (ref 6.5–8.1)

## 2016-10-24 LAB — COOXEMETRY PANEL
CARBOXYHEMOGLOBIN: 0.6 % (ref 0.5–1.5)
METHEMOGLOBIN: 0.8 % (ref 0.0–1.5)
O2 Saturation: 77.7 %
TOTAL HEMOGLOBIN: 14.3 g/dL (ref 12.0–16.0)

## 2016-10-24 LAB — PHOSPHORUS
PHOSPHORUS: 3.3 mg/dL (ref 2.5–4.6)
PHOSPHORUS: 4.5 mg/dL (ref 2.5–4.6)

## 2016-10-24 LAB — PROCALCITONIN: PROCALCITONIN: 0.28 ng/mL

## 2016-10-24 LAB — LACTATE DEHYDROGENASE, PLEURAL OR PERITONEAL FLUID: LD, Fluid: 2298 U/L — ABNORMAL HIGH (ref 3–23)

## 2016-10-24 LAB — CBC
HCT: 36.2 % — ABNORMAL LOW (ref 39.0–52.0)
Hemoglobin: 12.6 g/dL — ABNORMAL LOW (ref 13.0–17.0)
MCH: 31 pg (ref 26.0–34.0)
MCHC: 34.8 g/dL (ref 30.0–36.0)
MCV: 89.2 fL (ref 78.0–100.0)
Platelets: 280 10*3/uL (ref 150–400)
RBC: 4.06 MIL/uL — ABNORMAL LOW (ref 4.22–5.81)
RDW: 15.4 % (ref 11.5–15.5)
WBC: 36.7 10*3/uL — ABNORMAL HIGH (ref 4.0–10.5)

## 2016-10-24 LAB — ECHOCARDIOGRAM COMPLETE
HEIGHTINCHES: 71 in
Weight: 2363.33 oz

## 2016-10-24 LAB — MAGNESIUM
MAGNESIUM: 1.9 mg/dL (ref 1.7–2.4)
Magnesium: 2.1 mg/dL (ref 1.7–2.4)

## 2016-10-24 SURGERY — BRONCHOSCOPY, VIDEO-ASSISTED
Anesthesia: General | Site: Chest | Laterality: Right

## 2016-10-24 MED ORDER — PHENYLEPHRINE HCL 10 MG/ML IJ SOLN
INTRAMUSCULAR | Status: DC | PRN
  Administered 2016-10-24 (×2): 80 ug via INTRAVENOUS

## 2016-10-24 MED ORDER — PROPOFOL 10 MG/ML IV BOLUS
INTRAVENOUS | Status: AC
  Filled 2016-10-24: qty 20

## 2016-10-24 MED ORDER — EPINEPHRINE PF 1 MG/ML IJ SOLN
INTRAMUSCULAR | Status: AC
Start: 2016-10-24 — End: 2016-10-24
  Filled 2016-10-24: qty 1

## 2016-10-24 MED ORDER — LACTATED RINGERS IV SOLN
INTRAVENOUS | Status: DC | PRN
  Administered 2016-10-24 (×2): via INTRAVENOUS

## 2016-10-24 MED ORDER — ENOXAPARIN SODIUM 40 MG/0.4ML ~~LOC~~ SOLN
40.0000 mg | SUBCUTANEOUS | Status: DC
  Administered 2016-10-25 – 2016-11-02 (×9): 40 mg via SUBCUTANEOUS
  Filled 2016-10-24 (×9): qty 0.4

## 2016-10-24 MED ORDER — 0.9 % SODIUM CHLORIDE (POUR BTL) OPTIME
TOPICAL | Status: DC | PRN
  Administered 2016-10-24: 3000 mL

## 2016-10-24 MED ORDER — SODIUM CHLORIDE 0.9 % IV SOLN
30.0000 meq | Freq: Every day | INTRAVENOUS | Status: DC | PRN
  Administered 2016-10-24 – 2016-10-25 (×2): 30 meq via INTRAVENOUS
  Filled 2016-10-24 (×3): qty 15

## 2016-10-24 MED ORDER — ONDANSETRON HCL 4 MG/2ML IJ SOLN: 4.0000 mg | Freq: Four times a day (QID) | INTRAMUSCULAR | Status: DC | PRN

## 2016-10-24 MED ORDER — SODIUM CHLORIDE 0.9 % IV SOLN
0.0000 ug/min | INTRAVENOUS | Status: DC
  Administered 2016-10-25: 50 ug/min via INTRAVENOUS
  Filled 2016-10-24 (×3): qty 4

## 2016-10-24 MED ORDER — FENTANYL CITRATE (PF) 100 MCG/2ML IJ SOLN
INTRAMUSCULAR | Status: DC | PRN
  Administered 2016-10-24: 50 ug via INTRAVENOUS
  Administered 2016-10-24: 100 ug via INTRAVENOUS
  Administered 2016-10-24 (×2): 50 ug via INTRAVENOUS

## 2016-10-24 MED ORDER — EPINEPHRINE PF 1 MG/ML IJ SOLN
INTRAMUSCULAR | Status: DC | PRN
  Administered 2016-10-24: 1 mg

## 2016-10-24 MED ORDER — PHENYLEPHRINE HCL 10 MG/ML IJ SOLN
0.0000 ug/min | INTRAMUSCULAR | Status: DC
  Administered 2016-10-24 (×2): 75 ug/min via INTRAVENOUS
  Filled 2016-10-24 (×2): qty 1

## 2016-10-24 MED ORDER — EPHEDRINE SULFATE 50 MG/ML IJ SOLN
INTRAMUSCULAR | Status: DC | PRN
  Administered 2016-10-24: 10 mg via INTRAVENOUS

## 2016-10-24 MED ORDER — ROCURONIUM BROMIDE 100 MG/10ML IV SOLN
INTRAVENOUS | Status: DC | PRN
  Administered 2016-10-24: 30 mg via INTRAVENOUS
  Administered 2016-10-24: 20 mg via INTRAVENOUS
  Administered 2016-10-24: 50 mg via INTRAVENOUS

## 2016-10-24 MED ORDER — MIDAZOLAM HCL 5 MG/5ML IJ SOLN
INTRAMUSCULAR | Status: DC | PRN
  Administered 2016-10-24: 2 mg via INTRAVENOUS

## 2016-10-24 MED ORDER — ACETAMINOPHEN 160 MG/5ML PO SOLN
1000.0000 mg | Freq: Four times a day (QID) | ORAL | Status: AC
  Administered 2016-10-24 – 2016-10-29 (×12): 1000 mg via ORAL
  Filled 2016-10-24 (×12): qty 40.6

## 2016-10-24 MED ORDER — 0.9 % SODIUM CHLORIDE (POUR BTL) OPTIME
TOPICAL | Status: DC | PRN
  Administered 2016-10-24: 200 mL

## 2016-10-24 MED ORDER — LACTATED RINGERS IV BOLUS (SEPSIS)
1000.0000 mL | Freq: Once | INTRAVENOUS | Status: AC
  Administered 2016-10-24: 1000 mL via INTRAVENOUS

## 2016-10-24 MED ORDER — ACETAMINOPHEN 500 MG PO TABS: 1000.0000 mg | ORAL_TABLET | Freq: Four times a day (QID) | ORAL | Status: AC

## 2016-10-24 MED ORDER — PHENYLEPHRINE HCL 10 MG/ML IJ SOLN
INTRAVENOUS | Status: DC | PRN
  Administered 2016-10-24: 25 ug/min via INTRAVENOUS

## 2016-10-24 MED ORDER — PERFLUTREN LIPID MICROSPHERE
1.0000 mL | INTRAVENOUS | Status: AC | PRN
  Administered 2016-10-24: 3 mL via INTRAVENOUS
  Filled 2016-10-24: qty 10

## 2016-10-24 MED ORDER — ORAL CARE MOUTH RINSE
15.0000 mL | OROMUCOSAL | Status: DC
Start: 2016-10-24 — End: 2016-11-03
  Administered 2016-10-24 – 2016-11-03 (×97): 15 mL via OROMUCOSAL

## 2016-10-24 MED ORDER — VITAL AF 1.2 CAL PO LIQD
1000.0000 mL | ORAL | Status: DC
  Administered 2016-10-24: 1000 mL

## 2016-10-24 MED ORDER — MIDAZOLAM HCL 2 MG/2ML IJ SOLN
INTRAMUSCULAR | Status: AC
  Filled 2016-10-24: qty 2

## 2016-10-24 MED ORDER — FENTANYL CITRATE (PF) 250 MCG/5ML IJ SOLN
INTRAMUSCULAR | Status: AC
  Filled 2016-10-24: qty 5

## 2016-10-24 MED ORDER — IPRATROPIUM-ALBUTEROL 0.5-2.5 (3) MG/3ML IN SOLN
3.0000 mL | Freq: Four times a day (QID) | RESPIRATORY_TRACT | Status: DC
  Administered 2016-10-24 – 2016-11-03 (×40): 3 mL via RESPIRATORY_TRACT
  Filled 2016-10-24 (×40): qty 3

## 2016-10-24 MED ORDER — KCL IN DEXTROSE-NACL 20-5-0.9 MEQ/L-%-% IV SOLN
INTRAVENOUS | Status: DC
  Administered 2016-10-24 – 2016-10-28 (×8): via INTRAVENOUS
  Administered 2016-10-28: 75 mL/h via INTRAVENOUS
  Administered 2016-10-29: 10:00:00 via INTRAVENOUS
  Administered 2016-10-30: 75 mL/h via INTRAVENOUS
  Administered 2016-10-30 – 2016-11-03 (×2): via INTRAVENOUS
  Filled 2016-10-24 (×17): qty 1000

## 2016-10-24 MED ORDER — CHLORHEXIDINE GLUCONATE 0.12% ORAL RINSE (MEDLINE KIT)
15.0000 mL | Freq: Two times a day (BID) | OROMUCOSAL | Status: DC
  Administered 2016-10-24 – 2016-11-03 (×20): 15 mL via OROMUCOSAL

## 2016-10-24 SURGICAL SUPPLY — 84 items
APPLICATOR TIP COSEAL (VASCULAR PRODUCTS) IMPLANT
APPLICATOR TIP EXT COSEAL (VASCULAR PRODUCTS) IMPLANT
BLADE SURG 11 STRL SS (BLADE) IMPLANT
BRUSH CYTOL CELLEBRITY 1.5X140 (MISCELLANEOUS) IMPLANT
CANISTER SUCT 3000ML PPV (MISCELLANEOUS) ×3 IMPLANT
CATH KIT ON Q 5IN SLV (PAIN MANAGEMENT) IMPLANT
CATH THORACIC 28FR (CATHETERS) ×3 IMPLANT
CATH THORACIC 36FR (CATHETERS) IMPLANT
CATH THORACIC 36FR RT ANG (CATHETERS) IMPLANT
CLEANER TIP ELECTROSURG 2X2 (MISCELLANEOUS) ×3 IMPLANT
CLIP TI MEDIUM 6 (CLIP) ×3 IMPLANT
CONN ST 1/4X3/8  BEN (MISCELLANEOUS)
CONN ST 1/4X3/8 BEN (MISCELLANEOUS) IMPLANT
CONN Y 3/8X3/8X3/8  BEN (MISCELLANEOUS) ×4
CONN Y 3/8X3/8X3/8 BEN (MISCELLANEOUS) ×8 IMPLANT
CONT SPEC 4OZ CLIKSEAL STRL BL (MISCELLANEOUS) ×9 IMPLANT
COVER BACK TABLE 60X90IN (DRAPES) ×3 IMPLANT
COVER SURGICAL LIGHT HANDLE (MISCELLANEOUS) ×3 IMPLANT
DERMABOND ADVANCED (GAUZE/BANDAGES/DRESSINGS) ×1
DERMABOND ADVANCED .7 DNX12 (GAUZE/BANDAGES/DRESSINGS) ×2 IMPLANT
DRAIN CHANNEL 28F RND 3/8 FF (WOUND CARE) IMPLANT
DRAPE LAPAROSCOPIC ABDOMINAL (DRAPES) ×3 IMPLANT
DRAPE WARM FLUID 44X44 (DRAPE) ×3 IMPLANT
DRILL BIT 7/64X5 (BIT) IMPLANT
ELECT BLADE 4.0 EZ CLEAN MEGAD (MISCELLANEOUS) ×3
ELECT REM PT RETURN 9FT ADLT (ELECTROSURGICAL) ×3
ELECTRODE BLDE 4.0 EZ CLN MEGD (MISCELLANEOUS) ×2 IMPLANT
ELECTRODE REM PT RTRN 9FT ADLT (ELECTROSURGICAL) ×2 IMPLANT
FORCEPS BIOP RJ4 1.8 (CUTTING FORCEPS) IMPLANT
FORCEPS BIOP SUPERTRX PREMAR (INSTRUMENTS) ×3 IMPLANT
GAUZE SPONGE 4X4 12PLY STRL (GAUZE/BANDAGES/DRESSINGS) ×6 IMPLANT
GLOVE BIO SURGEON STRL SZ 6.5 (GLOVE) ×15 IMPLANT
GOWN STRL REUS W/ TWL LRG LVL3 (GOWN DISPOSABLE) ×10 IMPLANT
GOWN STRL REUS W/TWL LRG LVL3 (GOWN DISPOSABLE) ×5
KIT BASIN OR (CUSTOM PROCEDURE TRAY) ×3 IMPLANT
KIT CLEAN ENDO COMPLIANCE (KITS) ×3 IMPLANT
KIT ROOM TURNOVER OR (KITS) ×3 IMPLANT
KIT SUCTION CATH 14FR (SUCTIONS) ×3 IMPLANT
MARKER SKIN DUAL TIP RULER LAB (MISCELLANEOUS) IMPLANT
NEEDLE BIOPSY TRANSBRONCH 21G (NEEDLE) IMPLANT
NS IRRIG 1000ML POUR BTL (IV SOLUTION) ×12 IMPLANT
OIL SILICONE PENTAX (PARTS (SERVICE/REPAIRS)) ×3 IMPLANT
PACK CHEST (CUSTOM PROCEDURE TRAY) ×3 IMPLANT
PAD ARMBOARD 7.5X6 YLW CONV (MISCELLANEOUS) ×6 IMPLANT
PASSER SUT SWANSON 36MM LOOP (INSTRUMENTS) IMPLANT
SCISSORS LAP 5X35 DISP (ENDOMECHANICALS) IMPLANT
SEALANT PROGEL (MISCELLANEOUS) IMPLANT
SEALANT SURG COSEAL 4ML (VASCULAR PRODUCTS) IMPLANT
SEALANT SURG COSEAL 8ML (VASCULAR PRODUCTS) IMPLANT
SOLUTION ANTI FOG 6CC (MISCELLANEOUS) ×3 IMPLANT
SPONGE GAUZE 4X4 12PLY STER LF (GAUZE/BANDAGES/DRESSINGS) ×6 IMPLANT
SUT PROLENE 3 0 SH DA (SUTURE) IMPLANT
SUT PROLENE 4 0 RB 1 (SUTURE)
SUT PROLENE 4-0 RB1 .5 CRCL 36 (SUTURE) IMPLANT
SUT SILK  1 MH (SUTURE) ×4
SUT SILK 1 MH (SUTURE) ×8 IMPLANT
SUT SILK 1 TIES 10X30 (SUTURE) IMPLANT
SUT SILK 2 0SH CR/8 30 (SUTURE) IMPLANT
SUT SILK 3 0SH CR/8 30 (SUTURE) IMPLANT
SUT VIC AB 1 CTX 18 (SUTURE) IMPLANT
SUT VIC AB 1 CTX 36 (SUTURE)
SUT VIC AB 1 CTX36XBRD ANBCTR (SUTURE) IMPLANT
SUT VIC AB 2-0 CTX 36 (SUTURE) IMPLANT
SUT VIC AB 3-0 X1 27 (SUTURE) IMPLANT
SUT VICRYL 0 UR6 27IN ABS (SUTURE) IMPLANT
SUT VICRYL 2 TP 1 (SUTURE) IMPLANT
SWAB COLLECTION DEVICE MRSA (MISCELLANEOUS) IMPLANT
SYR 20ML ECCENTRIC (SYRINGE) ×12 IMPLANT
SYSTEM SAHARA CHEST DRAIN ATS (WOUND CARE) ×3 IMPLANT
TAPE CLOTH 4X10 WHT NS (GAUZE/BANDAGES/DRESSINGS) ×3 IMPLANT
TAPE CLOTH SURG 4X10 WHT LF (GAUZE/BANDAGES/DRESSINGS) ×6 IMPLANT
TAPE UMBILICAL COTTON 1/8X30 (MISCELLANEOUS) IMPLANT
TIP APPLICATOR SPRAY EXTEND 16 (VASCULAR PRODUCTS) IMPLANT
TOWEL GREEN STERILE (TOWEL DISPOSABLE) ×12 IMPLANT
TOWEL GREEN STERILE FF (TOWEL DISPOSABLE) ×6 IMPLANT
TOWEL OR 17X24 6PK STRL BLUE (TOWEL DISPOSABLE) ×6 IMPLANT
TOWEL OR 17X26 10 PK STRL BLUE (TOWEL DISPOSABLE) ×6 IMPLANT
TRAP SPECIMEN MUCOUS 40CC (MISCELLANEOUS) ×9 IMPLANT
TRAY FOLEY CATH SILVER 16FR LF (SET/KITS/TRAYS/PACK) IMPLANT
TROCAR BLADELESS 12MM (ENDOMECHANICALS) ×3 IMPLANT
TUBE ANAEROBIC SPECIMEN COL (MISCELLANEOUS) IMPLANT
TUBE CONNECTING 20X1/4 (TUBING) ×3 IMPLANT
TUNNELER SHEATH ON-Q 11GX8 DSP (PAIN MANAGEMENT) IMPLANT
WATER STERILE IRR 1000ML POUR (IV SOLUTION) ×6 IMPLANT

## 2016-10-24 NOTE — Progress Notes (Signed)
CT surgery p.m. Rounds  Sedated on ventilator after VATS for empyema Minimal chest tube drainage without airleak On Neo-Synephrine to maintain adequate blood pressure

## 2016-10-24 NOTE — Transfer of Care (Signed)
Immediate Anesthesia Transfer of Care Note  Patient: Mario Andrews  Procedure(s) Performed: Procedure(s): VIDEO BRONCHOSCOPY (N/A) VIDEO ASSISTED THORACOSCOPY (VATS)/DECORTICATION AND EMPYEMA (Right)  Patient Location: SICU  Anesthesia Type:General  Level of Consciousness: Patient remains intubated per anesthesia plan  Airway & Oxygen Therapy: Patient remains intubated per anesthesia plan and Patient placed on Ventilator (see vital sign flow sheet for setting)  Post-op Assessment: Report given to RN and Post -op Vital signs reviewed and stable  Post vital signs: Reviewed  Last Vitals:  Vitals:   10/25/2016 0700 10/12/2016 0715  BP: 95/69 (!) 89/70  Pulse: 77 78  Resp: (!) 23 13  Temp:      Last Pain:  Vitals:   10/17/2016 0400  TempSrc: Oral  PainSc:          Complications: No apparent anesthesia complications

## 2016-10-24 NOTE — Progress Notes (Signed)
[place] [facility] Attending Note  Presenting HPI:  56 y.o. male with a history of tobacco abuse, snake bite, cocaine abuse, hepatitis C came to the [facility] emergency department on 09/29/2016 complaining of shortness of breath. He had been seen one day prior for pneumonia and it was recommended that he be admitted for further treatment but he left AGAINST MEDICAL ADVICE. He had been prescribed Levaquin. He took the antibiotics, but he stated that his dyspnea progressed so he called 911. In the emergency department his breathing improved briefly with the application of positive pressure ventilation but his respiratory status continued to decline so he was intubated by the emergency room physicians around 1:30 AM. On my exam he was intubated and could not provide further history. History was obtained from chart review and talking to other healthcare providers.  Subjective:  Patient taken to the operating room this morning for bronchoscopy and video-assisted thoracoscope topics surgery with decortication and chest tube placement.  Review of Systems:  Unable to obtain with endotracheal intubation and sedation.  Vent Mode: PRVC FiO2 (%):  [40 %-50 %] 40 % Set Rate:  [26 bmp] 26 bmp Vt Set:  [560 mL] 560 mL PEEP:  [5 cmH20] 5 cmH20 Plateau Pressure:  [17 cmH20-25 cmH20] 25 cmH20  Temp:  [97.4 F (36.3 C)-97.8 F (36.6 C)] 97.4 F (36.3 C) (02/27 0400) Pulse Rate:  [45-78] 78 (02/27 0715) Resp:  [7-26] 13 (02/27 0715) BP: (84-108)/(54-87) 89/70 (02/27 0715) SpO2:  [90 %-100 %] 94 % (02/27 0715) FiO2 (%):  [40 %-50 %] 40 % (02/27 0403) Weight:  [147 lb 11.3 oz (67 kg)] 147 lb 11.3 oz (67 kg) (02/27 0420)  Gen.: No distress. No family at bedside. Sedated. Integument: Warm and dry. No rash on exposed skin. Right-sided surgical dressings in place. HEENT: No scleral icterus. Endotracheal tube in place. Moist mucous membranes. Musculoskeletal: No joint effusion or  erythema. Pulmonary: Coarse bilateral wheeze. Symmetric chest wall rise on ventilator. Cardiovascular: Regular rate and rhythm. No JVD appreciated. Continued pitting lower extremity edema. Abdomen: Soft. Nondistended. Normal bowel sounds. Neurological: Pupils symmetric and reactive. No spontaneous movements.  LINES/TUBES: OETT 7.5 2/26 >>> Foley 2/26 >>> OGT 2/26 >>> Right Chest Tube x2 2/27 >>> R IJ CVL 2/26 >>> R Radial Art Line 2/27 >>> PIV  CBC Latest Ref Rng & Units 10/01/2016 10/23/2016 10/23/2016  WBC 4.0 - 10.5 K/uL 23.4(H) 18.3(H) 23.3(H)  Hemoglobin 13.0 - 17.0 g/dL 11.1(L) 10.4(L) 10.7(L)  Hematocrit 39.0 - 52.0 % 32.1(L) 30.6(L) 31.8(L)  Platelets 150 - 400 K/uL 215 178 175   BMP Latest Ref Rng & Units 10/04/2016 10/23/2016 10/23/2016  Glucose 65 - 99 mg/dL 116(H) 143(H) 151(H)  BUN 6 - 20 mg/dL '17 14 12  ' Creatinine 0.61 - 1.24 mg/dL 0.49(L) 0.54(L) 0.57(L)  Sodium 135 - 145 mmol/L 138 136 134(L)  Potassium 3.5 - 5.1 mmol/L 3.5 3.6 3.8  Chloride 101 - 111 mmol/L 106 103 103  CO2 22 - 32 mmol/L '26 25 23  ' Calcium 8.9 - 10.3 mg/dL 7.8(L) 7.6(L) 7.7(L)   Hepatic Function Latest Ref Rng & Units 10/14/2016 10/23/2016 10/23/2016  Total Protein 6.5 - 8.1 g/dL 6.2(L) 5.6(L) 6.2(L)  Albumin 3.5 - 5.0 g/dL 1.4(L) 1.3(L) 1.4(L)  AST 15 - 41 U/L 54(H) 57(H) 56(H)  ALT 17 - 63 U/L '24 22 21  ' Alk Phosphatase 38 - 126 U/L 117 107 104  Total Bilirubin 0.3 - 1.2 mg/dL 1.1 1.2 1.6(H)  Bilirubin, Direct 0.1 -  0.5 mg/dL 0.5 - 0.7(H)    IMAGING/STUDIES: CT CHEST W/O 2/26:  Previously reviewed by me. Liver cirrhosis with a small amount of ascites. Enlarged precarinal lymph nodes. Emphysema noted predominantly apical on the left. Dense consolidation on the right with pockets and what seems to be air-fluid levels. Appears to have a pleural effusion on the right as well.  MICROBIOLOGY: HIV 1/2 2/23:  Nonreactive Blood Culture x1 2/24 >>> MRSA PCR 2/26:  Negative  Blood Cultures x2 2/25  >>> Tracheal Aspirate Culture 2/26 >>> Respiratory Panel PCR 2/26:  Negative  Influenza A/B PCR 2/26:  Negative  Urine Streptococcal Antigen 2/26:  Negative  Urine Legionella Antigen 2/26 >>> Bronchial Wash Right 2/27 >>>  ANTIBIOTICS: Cefepime 2/26 (1 dose) Vancomycin 2/26 (1 dose) Tamiflu 2/26 (stopped) Azithromycin 2/26 >>> Rocephin 2/26 >>>  SIGNIFICANT EVENTS: 02/26 - Admit 02/27 - VATS w/ decortication & bronchoscopy by Dr. Servando Snare  ASSESSMENT/PLAN:  56 y.o. male with known history of hepatitis C and liver cirrhosis as well as polysubstance abuse including tobacco and cocaine. Patient now status post video-assisted thoracoscope surgery with decortication and chest tube placement. Also underwent endobronchial biopsy of right upper lobe obstructing mass.   1. Severe community acquired pneumonia/postobstructive pneumonia: Continuing Rocephin and azithromycin day #2. Awaiting cultures. Trending Procalcitonin. 2. Severe sepsis with septic shock: Trending Procalcitonin per algorithm. Weaning vasopressor support. Continuing gentle IV fluid bolus. Cultures pending. 3. Acute hypoxic respiratory failure: Multifactorial from severe community acquired pneumonia and postobstructive pneumonia. Continuing full ventilator support postoperatively. Continuing with daily spontaneous breathing trials and pressure support wean. Starting [employer].  4. Hypotension: Intermittent. Weaning vasopressor support. Echocardiogram pending. Vitals reviewed per protocol. Gentle IV fluid boluses. 5. Elevated troponin I: Suspect demand ischemia. Transthoracic echocardiogram pending. 6. Right sided complicated parapneumonic effusion/hydropneumothorax: Status post VATS with chest tube placement. Cultures and cytology pending. Chest tube management per thoracic surgery. 7. Right upper lobe endobronchial mass: Pathology from biopsies pending. Plan for further staging after patient treated for pneumonia. 8. Moderate  malnutrition: Tube feedings per dietary recommendations. 9. History of polysubstance abuse: Monitoring for signs of withdrawal. Continuing propofol, Versed, and fentanyl. 10. Liver cirrhosis with ascites: Known history of hepatitis C viral infection.   Prophylaxis:  Pepcid IV q12hr, SCDs, & Lovenox [place] daily.  Diet:  NPO. Restarting tube feedings per dietary recommendations. Code Status: Full code per previous physician discussions. Disposition: Remains critically ill and endotracheally intubated in the intensive care unit. Family Update:  Brother updated at bedside by me after rounds on 2/26.  I have spent a total of 32 minutes of critical care time today caring for the patient and reviewing the patient's electronic medical record.   Sonia Baller Ashok Cordia, M.D. [facility] Pager:  [phone] After 3pm or if no response, call [phone] 10:30 AM 10/05/2016

## 2016-10-24 NOTE — Progress Notes (Signed)
RT note- Patient taken to the OR

## 2016-10-24 NOTE — Anesthesia Procedure Notes (Signed)
Procedure Name: Intubation Date/Time: 10/20/2016 8:36 AM Performed by: Gaylene Brooks Pre-anesthesia Checklist: Patient identified, Emergency Drugs available, Suction available and Patient being monitored Patient Re-evaluated:Patient Re-evaluated prior to inductionOxygen Delivery Method: Circle System Utilized Preoxygenation: Pre-oxygenation with 100% oxygen Laryngoscope Size: Miller and 2 Grade View: Grade I Tube type: Oral Endobronchial tube: Left, EBT position confirmed by fiberoptic bronchoscope, EBT position confirmed by auscultation and Double lumen EBT and 39 Fr Number of attempts: 1 Airway Equipment and Method: Stylet and Oral airway Placement Confirmation: ETT inserted through vocal cords under direct vision,  positive ETCO2 and breath sounds checked- equal and bilateral Tube secured with: Tape Dental Injury: Teeth and Oropharynx as per pre-operative assessment

## 2016-10-24 NOTE — Anesthesia Postprocedure Evaluation (Addendum)
Anesthesia Post Note  Patient: Mario Andrews  Procedure(s) Performed: Procedure(s) (LRB): VIDEO BRONCHOSCOPY (N/A) VIDEO ASSISTED THORACOSCOPY (VATS)/DECORTICATION AND EMPYEMA (Right)  Patient location during evaluation: SICU Anesthesia Type: General Level of consciousness: sedated and patient remains intubated per anesthesia plan Pain management: pain level controlled Vital Signs Assessment: post-procedure vital signs reviewed and stable Respiratory status: patient remains intubated per anesthesia plan Cardiovascular status: stable Anesthetic complications: no       Last Vitals:  Vitals:   09/28/2016 1130 10/10/2016 1145  BP:  94/68  Pulse: 79 77  Resp: (!) 26 (!) 26  Temp:      Last Pain:  Vitals:   10/16/2016 0400  TempSrc: Oral  PainSc:                  Kaylin Schellenberg,JAMES TERRILL

## 2016-10-24 NOTE — Anesthesia Preprocedure Evaluation (Addendum)
Anesthesia Evaluation  Patient identified by MRN, date of birth, ID band Patient unresponsive    Reviewed: Unable to perform ROS - Chart review only  Airway Mallampati: Intubated   Neck ROM: Limited    Dental  (+) Poor Dentition   Pulmonary pneumonia, COPD, Current Smoker,    + rhonchi  + decreased breath sounds      Cardiovascular  Rhythm:Regular     Neuro/Psych Seizures -,  CVA    GI/Hepatic (+)     substance abuse  cocaine use, Hepatitis -  Endo/Other    Renal/GU      Musculoskeletal   Abdominal   Peds  Hematology   Anesthesia Other Findings   Reproductive/Obstetrics                            Anesthesia Physical Anesthesia Plan  ASA: IV  Anesthesia Plan: General   Post-op Pain Management:    Induction: Inhalational and Intravenous  Airway Management Planned: Oral ETT  Additional Equipment: Arterial line  Intra-op Plan:   Post-operative Plan: Post-operative intubation/ventilation  Informed Consent: I have reviewed the patients History and Physical, chart, labs and discussed the procedure including the risks, benefits and alternatives for the proposed anesthesia with the patient or authorized representative who has indicated his/her understanding and acceptance.     Plan Discussed with: CRNA  Anesthesia Plan Comments:         Anesthesia Quick Evaluation

## 2016-10-24 NOTE — Anesthesia Procedure Notes (Signed)
Procedure Name: Intubation Date/Time: 10/11/2016 10:09 AM Performed by: Gaylene Brooks Pre-anesthesia Checklist: Patient identified, Emergency Drugs available, Suction available and Patient being monitored Patient Re-evaluated:Patient Re-evaluated prior to inductionOxygen Delivery Method: Circle System Utilized Preoxygenation: Pre-oxygenation with 100% oxygen Laryngoscope Size: Miller and 2 Grade View: Grade I Tube type: Subglottic suction tube Tube size: 8.0 mm Number of attempts: 1 Airway Equipment and Method: Stylet and Oral airway Placement Confirmation: ETT inserted through vocal cords under direct vision,  positive ETCO2 and breath sounds checked- equal and bilateral Secured at: 22 cm Tube secured with: Tape Dental Injury: Teeth and Oropharynx as per pre-operative assessment  Comments: DLT removed. 8.0 ETT placed without difficulty.

## 2016-10-24 NOTE — Brief Op Note (Addendum)
      [address]       [place] [address]             [phone]     09/30/2016  9:50 AM  PATIENT:  Mario Andrews  56 y.o. male  PRE-OPERATIVE DIAGNOSIS:  1. Respiratory failure 2.RUL PNA 3. Right empyema  POST-OPERATIVE DIAGNOSIS: 1. Lung Cancer Right Upper Lube Bronchus 2. Empyema  PROCEDURE: VIDEO BRONCHOSCOPY, RIGHT VIDEO ASSISTED THORACOSCOPY (VATS), DECORTICATION, DRAIN RIGHT EMPYEMA/PLEURAL EFFUSION  SURGEON:  Surgeon(s) and Role:    * Grace Isaac, MD - Primary  PHYSICIAN ASSISTANT: Lars Pinks PA-C  ANESTHESIA:   general  EBL:  Total I/O In: -  Out: 72 [Urine:40; Blood:50]  BLOOD ADMINISTERED:none  DRAINS: 2 28 Blake drains and a 28 French chest tube placed in the right pleural space   SPECIMEN:  Source of Specimen:  Right pleural fluid and peel and Mastectomy with ALND  DISPOSITION OF SPECIMEN:  Pathology, cytology, culture  COUNTS CORRECT:  YES  DICTATION: .Dragon Dictation  PLAN OF CARE: Admit to inpatient   PATIENT DISPOSITION:  ICU - intubated and hemodynamically stable.   Delay start of Pharmacological VTE agent (>24hrs) due to surgical blood loss or risk of bleeding: yes

## 2016-10-24 NOTE — Progress Notes (Signed)
Pre Procedure note for inpatients:   Mario Andrews has been scheduled for Procedure(s): VIDEO BRONCHOSCOPY (N/A) VIDEO ASSISTED THORACOSCOPY (VATS)/DECORTICATION (Right) today. The various methods of treatment have been discussed with the patient. After consideration of the risks, benefits and treatment options the patient has consented to the planned procedure.   The patient has been seen and labs reviewed. There are no changes in the patient's condition to prevent proceeding with the planned procedure today.  Recent labs:  Lab Results  Component Value Date   WBC 23.4 (H) 10/14/2016   HGB 11.1 (L) 10/13/2016   HCT 32.1 (L) 10/06/2016   PLT 215 10/06/2016   GLUCOSE 116 (H) 10/20/2016   TRIG 77 10/23/2016   ALT 24 10/15/2016   AST 54 (H) 10/21/2016   NA 138 10/10/2016   K 3.5 10/23/2016   CL 106 10/15/2016   CREATININE 0.49 (L) 10/07/2016   BUN 17 10/18/2016   CO2 26 10/17/2016   TSH 5.250 (H) 05/15/2014   INR 1.57 10/23/2016    Grace Isaac, MD 10/25/2016 7:31 AM

## 2016-10-24 NOTE — Progress Notes (Signed)
[facility] Progress Note Patient Name: Mario Andrews DOB: 1960-11-18 MRN: [id number]   Date of Service  10/23/2016  HPI/Events of Note  Request to renew restraint orders.   eICU Interventions  Will renew restraint orders.      Intervention Category Minor Interventions: Agitation / anxiety - evaluation and management  Asael Pann Cornelia Copa 10/01/2016, 12:57 AM

## 2016-10-24 NOTE — Progress Notes (Signed)
  Echocardiogram 2D Echocardiogram has been performed with definity.  Aggie Cosier 10/02/2016, 12:19 PM

## 2016-10-25 ENCOUNTER — Inpatient Hospital Stay (HOSPITAL_COMMUNITY): Payer: Medicaid Other

## 2016-10-25 ENCOUNTER — Encounter (HOSPITAL_COMMUNITY): Payer: Self-pay | Admitting: Cardiothoracic Surgery

## 2016-10-25 DIAGNOSIS — J181 Lobar pneumonia, unspecified organism: Secondary | ICD-10-CM

## 2016-10-25 DIAGNOSIS — E440 Moderate protein-calorie malnutrition: Secondary | ICD-10-CM

## 2016-10-25 LAB — BASIC METABOLIC PANEL
Anion gap: 5 (ref 5–15)
BUN: 17 mg/dL (ref 6–20)
CALCIUM: 7.3 mg/dL — AB (ref 8.9–10.3)
CO2: 23 mmol/L (ref 22–32)
CREATININE: 0.48 mg/dL — AB (ref 0.61–1.24)
Chloride: 111 mmol/L (ref 101–111)
GFR calc Af Amer: >60 mL/min (ref >60)
GFR calc non Af Amer: >60 mL/min (ref >60)
Glucose, Bld: 159 mg/dL — ABNORMAL HIGH (ref 65–99)
Potassium: 3.6 mmol/L (ref 3.5–5.1)
SODIUM: 139 mmol/L (ref 135–145)

## 2016-10-25 LAB — POCT I-STAT 3, ART BLOOD GAS (G3+)
ACID-BASE DEFICIT: 2 mmol/L (ref 0.0–2.0)
Bicarbonate: 21.7 mmol/L (ref 20.0–28.0)
O2 Saturation: 97 %
TCO2: 23 mmol/L (ref 0–100)
pCO2 arterial: 31.5 mmHg — ABNORMAL LOW (ref 32.0–48.0)
pH, Arterial: 7.445 (ref 7.350–7.450)
pO2, Arterial: 81 mmHg — ABNORMAL LOW (ref 83.0–108.0)

## 2016-10-25 LAB — CBC
HCT: 31.7 % — ABNORMAL LOW (ref 39.0–52.0)
Hemoglobin: 10.5 g/dL — ABNORMAL LOW (ref 13.0–17.0)
MCH: 30 pg (ref 26.0–34.0)
MCHC: 33.1 g/dL (ref 30.0–36.0)
MCV: 90.6 fL (ref 78.0–100.0)
Platelets: 204 10*3/uL (ref 150–400)
RBC: 3.5 MIL/uL — ABNORMAL LOW (ref 4.22–5.81)
RDW: 16.2 % — AB (ref 11.5–15.5)
WBC: 22.6 10*3/uL — ABNORMAL HIGH (ref 4.0–10.5)

## 2016-10-25 LAB — ACID FAST SMEAR (AFB, MYCOBACTERIA)
Acid Fast Smear: NEGATIVE
Acid Fast Smear: NEGATIVE

## 2016-10-25 LAB — GLUCOSE, CAPILLARY
GLUCOSE-CAPILLARY: 141 mg/dL — AB (ref 65–99)
GLUCOSE-CAPILLARY: 161 mg/dL — AB (ref 65–99)
Glucose-Capillary: 100 mg/dL — ABNORMAL HIGH (ref 65–99)
Glucose-Capillary: 82 mg/dL (ref 65–99)
Glucose-Capillary: 96 mg/dL (ref 65–99)
Glucose-Capillary: 99 mg/dL (ref 65–99)

## 2016-10-25 LAB — PROCALCITONIN: PROCALCITONIN: 0.69 ng/mL

## 2016-10-25 LAB — CULTURE, RESPIRATORY W GRAM STAIN: Culture: NORMAL

## 2016-10-25 LAB — MAGNESIUM: Magnesium: 2 mg/dL (ref 1.7–2.4)

## 2016-10-25 LAB — CULTURE, RESPIRATORY: SPECIAL REQUESTS: NORMAL

## 2016-10-25 LAB — GLUCOSE, BODY FLUID OTHER: Glucose, Body Fluid Other: <2 mg/dL

## 2016-10-25 LAB — PHOSPHORUS: Phosphorus: 3.1 mg/dL (ref 2.5–4.6)

## 2016-10-25 MED ORDER — LACTATED RINGERS IV BOLUS (SEPSIS)
1000.0000 mL | Freq: Once | INTRAVENOUS | Status: AC
  Administered 2016-10-25: 1000 mL via INTRAVENOUS

## 2016-10-25 MED ORDER — NICOTINE 14 MG/24HR TD PT24
14.0000 mg | MEDICATED_PATCH | Freq: Every day | TRANSDERMAL | Status: DC
  Administered 2016-10-25 – 2016-11-02 (×9): 14 mg via TRANSDERMAL
  Filled 2016-10-25 (×9): qty 1

## 2016-10-25 MED ORDER — THIAMINE HCL 100 MG/ML IJ SOLN
100.0000 mg | Freq: Every day | INTRAMUSCULAR | Status: DC
  Administered 2016-10-25 – 2016-11-03 (×10): 100 mg via INTRAVENOUS
  Filled 2016-10-25 (×10): qty 2

## 2016-10-25 MED ORDER — FOLIC ACID 5 MG/ML IJ SOLN
1.0000 mg | Freq: Every day | INTRAMUSCULAR | Status: DC
  Administered 2016-10-25 – 2016-11-03 (×10): 1 mg via INTRAVENOUS
  Filled 2016-10-25 (×12): qty 0.2

## 2016-10-25 MED ORDER — SODIUM CHLORIDE 0.9 % IV SOLN
0.0000 ug/min | INTRAVENOUS | Status: DC
  Administered 2016-10-25: 30 ug/min via INTRAVENOUS
  Administered 2016-10-26: 10 ug/min via INTRAVENOUS
  Administered 2016-10-28: 45 ug/min via INTRAVENOUS
  Filled 2016-10-25 (×3): qty 8

## 2016-10-25 NOTE — Progress Notes (Signed)
      [address]       [place] [address]             [phone]      POD # 1 decortication  Intubated  BP 95/64   Pulse 70   Temp (!) 96.6 F (35.9 C) (Axillary)   Resp (!) 8   Ht '5\' 11"'$  (1.803 m)   Wt 154 lb 5.2 oz (70 kg)   SpO2 99%   BMI 21.52 kg/m    Intake/Output Summary (Last 24 hours) at 10/25/16 1712 Last data filed at 10/25/16 1600  Gross per 24 hour  Intake          4651.88 ml  Output             1505 ml  Net          3146.88 ml   CBG well controlled  Continue current care  Taniah Reinecke C. Roxan Hockey, MD [facility] [phone]

## 2016-10-25 NOTE — Progress Notes (Addendum)
[place] [facility] Attending Note  Presenting HPI:  56 y.o. male with a history of tobacco abuse, snake bite, cocaine abuse, hepatitis C came to the [facility] emergency department on 10/03/2016 complaining of shortness of breath. He had been seen one day prior for pneumonia and it was recommended that he be admitted for further treatment but he left AGAINST MEDICAL ADVICE. He had been prescribed Levaquin. He took the antibiotics, but he stated that his dyspnea progressed so he called 911. In the emergency department his breathing improved briefly with the application of positive pressure ventilation but his respiratory status continued to decline so he was intubated by the emergency room physicians around 1:30 AM. On my exam he was intubated and could not provide further history. History was obtained from chart review and talking to other healthcare providers.  Subjective:  Status post VATS yesterday. No acute events overnight. Abdomen is more distended and patient has not had a bowel movement yet. Previously started on tube feedings.  Review of Systems:  Unable to obtain due to intubation and sedation.  Vent Mode: PRVC FiO2 (%):  [40 %-50 %] 40 % Set Rate:  [26 bmp] 26 bmp Vt Set:  [560 mL] 560 mL PEEP:  [5 cmH20] 5 cmH20 Plateau Pressure:  [20 cmH20-22 cmH20] 22 cmH20  Temp:  [97.1 F (36.2 C)-98.5 F (36.9 C)] 97.3 F (36.3 C) (02/28 0715) Pulse Rate:  [53-92] 71 (02/28 0800) Resp:  [9-27] 22 (02/28 0800) BP: (78-111)/(45-69) 98/68 (02/28 0800) SpO2:  [92 %-100 %] 99 % (02/28 0858) Arterial Line BP: (80-139)/(43-76) 111/57 (02/28 0800) FiO2 (%):  [40 %-50 %] 40 % (02/28 0858) Weight:  [154 lb 5.2 oz (70 kg)] 154 lb 5.2 oz (70 kg) (02/28 0500)  Gen.: Laying in bed. Eyes closed. No family at bedside. Integument: Warm and dry. No rash or bruising on exposed skin. Right-sided surgical dressings in place. HEENT: No scleral icterus or injection. Moist mucous membranes.  Endotracheal tube in place. Abdomen: Soft but distended. Hypoactive bowel sounds. More firm than yesterday. Pulmonary: Continued coarse wheeze bilaterally. Symmetric chest wall rise on ventilator. Right-sided chest tubes in place. Colovesical: Regular rate and rhythm. Pitting lower extremity edema unchanged. No JVD appreciated. Neurological: Pupils symmetric and reactive. Sedated. No spontaneous movements.  LINES/TUBES: OETT 7.5 2/26 >>> Foley 2/26 >>> OGT 2/26 >>> Right Chest Tube x2 2/27 >>> R IJ CVL 2/26 >>> R Radial Art Line 2/27 >>> PIV  CBC Latest Ref Rng & Units 10/08/2016 10/07/2016 10/23/2016  WBC 4.0 - 10.5 K/uL 36.7(H) 23.4(H) 18.3(H)  Hemoglobin 13.0 - 17.0 g/dL 12.6(L) 11.1(L) 10.4(L)  Hematocrit 39.0 - 52.0 % 36.2(L) 32.1(L) 30.6(L)  Platelets 150 - 400 K/uL 280 215 178   BMP Latest Ref Rng & Units 10/25/2016 10/20/2016 09/30/2016  Glucose 65 - 99 mg/dL 159(H) 120(H) 116(H)  BUN 6 - 20 mg/dL _0 Creatinine 0.61 - 1.24 mg/dL 0.48(L) 0.52(L) 0.49(L)  Sodium 135 - 145 mmol/L 139 136 138  Potassium 3.5 - 5.1 mmol/L 3.6 3.7 3.5  Chloride 101 - 111 mmol/L 111 106 106  CO2 22 - 32 mmol/L _1 Calcium 8.9 - 10.3 mg/dL 7.3(L) 7.9(L) 7.8(L)   Hepatic Function Latest Ref Rng & Units 10/10/2016 10/23/2016 10/23/2016  Total Protein 6.5 - 8.1 g/dL 6.2(L) 5.6(L) 6.2(L)  Albumin 3.5 - 5.0 g/dL 1.4(L) 1.3(L) 1.4(L)  AST 15 - 41 U/L 54(H) 57(H) 56(H)  ALT 17 - 63 U/L 24 22 21  Alk Phosphatase 38 - 126 U/L 117 107 104  Total Bilirubin 0.3 - 1.2 mg/dL 1.1 1.2 1.6(H)  Bilirubin, Direct 0.1 - 0.5 mg/dL 0.5 - 0.7(H)    IMAGING/STUDIES: CT CHEST W/O 2/26:  Previously reviewed by me. Liver cirrhosis with a small amount of ascites. Enlarged precarinal lymph nodes. Emphysema noted predominantly apical on the left. Dense consolidation on the right with pockets and what seems to be air-fluid levels. Appears to have a pleural effusion on the right as well. TTE 2/27:Poor quality imaging.  LV cavity size normal. EF 55-60% with no regional wall motion abnormalities. Unable to assess diastolic function. No obvious aortic stenosis or regurgitation with poorly visualized valve. Aortic root normal in size. No significant mitral regurgitation. Left atrium normal in size. Right ventricle and right Atrium poorly visualized. Moderate-sized pericardial effusion apically and posterior to the heart. RIGHT PLEURAL FLUID 2/27:  LDH 2298 / WBC 28,000 (98% neutrophils & 2% monocytes) RUL ENDOBRONCHIAL BIOPSIES 2/27 >>> PORT CXR 2/28:  Personally reviewed by me. Right-sided chest tubes in good position. Endotracheal and enteric tube is in good position. Persistent opacification right upper lobe with improved aeration right lung base.  MICROBIOLOGY: HIV 1/2 2/23:  Nonreactive Urine Culture 2/23:  <10,000 insignificant growth Blood Culture x1 2/24 >>> MRSA PCR 2/26:  Negative  Blood Cultures x2 2/25 >>> Tracheal Aspirate Culture 2/26 >>> Respiratory Panel PCR 2/26:  Negative  Influenza A/B PCR 2/26:  Negative  Urine Streptococcal Antigen 2/26:  Negative  Urine Legionella Antigen 2/26:  Negative  Bronchial Wash Right 2/27 >>> Right Pleural Fluid Culture 2/27 >>>  ANTIBIOTICS: Cefepime 2/26 (1 dose) Vancomycin 2/26 (1 dose) Tamiflu 2/26 (stopped) Azithromycin 2/26 >>> Rocephin 2/26 >>>  SIGNIFICANT EVENTS: 02/26 - Admit 02/27 - VATS w/ decortication & bronchoscopy by Dr. Servando Snare  ASSESSMENT/PLAN:  56 y.o. male with known history of hepatitis C liver cirrhosis. Known polysubstance abuse including alcohol, tobacco, and cocaine. Status post video-assisted thoracoscopic surgery with decortication and right chest tube placement on 2/27. Has persistent right upper lobe opacification likely due to endobronchial mass which was biopsied with pathology pending.   1. Significant community acquired pneumonia/postobstructive pneumonia: Discontinuing azithromycin. Continuing Rocephin day #3. Awaiting  culture results and trending Procalcitonin per algorithm. 2. Severe sepsis with septic shock: Continuing to trend Procalcitonin per algorithm. Weaning vasopressor support. Cultures pending. The escalating antibiotic therapy. 3. Abdominal distention: Stopping tube feedings. Placing NG tube to low intermittent suction. Checking stat portable abdominal x-ray. 4. Acute hypoxic respiratory failure: Multifactorial from severe community acquired pneumonia/postobstructive pneumonia. Continuing full ventilator support. Continuing Duonebs every 6 hours. Plan to attempt spontaneous breathing trial in the morning. 5. Elevated troponin I: Suspect demand ischemia. Poor quality transthoracic echocardiogram. 6. Pericardial effusion: No obvious physiology that would suggest tamponade nod. Plan to repeat echocardiogram in 5-7 days or sooner if hemodynamics change. 7. Right sided complicated parapneumonic effusion/hydropneumothorax: Status post video-assisted thoracoscopic surgery and chest tube placement. Cultures, cytology, and pathology pending. Management of chest tubes per thoracic surgery. 8. Moderate malnutrition: Holding tube feedings given possibility of ileus or small bowel obstruction. 9. History of polysubstance abuse: Monitoring for signs of withdrawal. Continuing propofol, Versed, and fentanyl. Starting thiamine 100 mg IV daily & folic acid IV daily. Placing nicotine patch. 10. Liver cirrhosis with ascites: Known history of hepatitis C viral infection. Chronic alcohol use.   Prophylaxis:  Pepcid IV q12hr, SCDs, & Lovenox [place] daily.  Diet:  NPO. Holding tube feedings. Code Status: Full code per previous physician  discussions. Disposition: Remains critically ill and endotracheally intubated in the intensive care unit. Family Update:  Brother updated at bedside by me after rounds on 2/26. No family at bedside during rounds today.  I have spent a total of 33 minutes of critical care time today caring for the  patient and reviewing the patient's electronic medical record.  Sonia Baller Ashok Cordia, M.D. [facility] Pulmonary & Critical Care Pager:  [phone] After 3pm or if no response, call [phone] 9:18 AM 10/25/16

## 2016-10-25 NOTE — Plan of Care (Signed)
Problem: Education: Goal: Knowledge of [place] [facility] Education information/materials will improve Outcome: Not Progressing PT remains intubated and sedated  Problem: Pain Managment: Goal: General experience of comfort will improve Outcome: Progressing Pt is medicated PRN for pain   Problem: Physical Regulation: Goal: Will remain free from infection Outcome: Progressing Standard precautions maintained  Problem: Skin Integrity: Goal: Risk for impaired skin integrity will decrease Outcome: Progressing Turned and reposition Q2 hours  Problem: Cardiac: Goal: Hemodynamic stability will improve Outcome: Not Progressing Pt continues to have need for vasopressors

## 2016-10-25 NOTE — Progress Notes (Addendum)
TCTS DAILY ICU PROGRESS NOTE                   [address]            [place], [address]          [phone]   1 Day Post-Op Procedure(s) (LRB): VIDEO BRONCHOSCOPY (N/A) VIDEO ASSISTED THORACOSCOPY (VATS)/DECORTICATION AND EMPYEMA (Right)  Total Length of Stay:  LOS: 2 days   Subjective: Patient sedated on vent  Objective: Vital signs in last 24 hours: Temp:  [97.1 F (36.2 C)-98.5 F (36.9 C)] 97.7 F (36.5 C) (02/28 0400) Pulse Rate:  [53-92] 74 (02/28 0715) Cardiac Rhythm: Normal sinus rhythm (02/28 0600) Resp:  [9-27] 27 (02/28 0715) BP: (78-111)/(45-69) 96/63 (02/28 0715) SpO2:  [92 %-100 %] 97 % (02/28 0715) Arterial Line BP: (80-139)/(43-76) 104/53 (02/28 0715) FiO2 (%):  [40 %-50 %] 40 % (02/28 0424) Weight:  [154 lb 5.2 oz (70 kg)] 154 lb 5.2 oz (70 kg) (02/28 0500)  Filed Weights   10/23/16 0400 10/11/2016 0420 10/25/16 0500  Weight: 146 lb 13.2 oz (66.6 kg) 147 lb 11.3 oz (67 kg) 154 lb 5.2 oz (70 kg)    Weight change: 6 lb 9.8 oz (3 kg)      Intake/Output from previous day: 02/27 0701 - 02/28 0700 In: 5815 [I.V.:4294; NG/GT:856; IV Piggyback:665] Out: 1270 [Urine:790; Emesis/NG output:50; Blood:50; Chest Tube:380]  Intake/Output this shift: No intake/output data recorded.  Current Meds: Scheduled Meds: . acetaminophen  1,000 mg Oral Q6H   Or  . acetaminophen (TYLENOL) oral liquid 160 mg/5 mL  1,000 mg Oral Q6H  . azithromycin  500 mg Intravenous Q0600  . cefTRIAXone (ROCEPHIN)  IV  2 g Intravenous Q0600  . chlorhexidine gluconate (MEDLINE KIT)  15 mL Mouth Rinse BID  . enoxaparin (LOVENOX) injection  40 mg Subcutaneous Q24H  . famotidine (PEPCID) IV  20 mg Intravenous Q12H  . ipratropium-albuterol  3 mL Nebulization Q6H  . mouth rinse  15 mL Mouth Rinse 10 times per day  . multivitamin  15 mL Per Tube Daily   Continuous Infusions: . dextrose 5 % and 0.9 % NaCl with KCl 20 mEq/L 100 mL/hr at 10/25/16 0700  . feeding  supplement (VITAL AF 1.2 CAL) 1,000 mL (10/25/16 0700)  . lactated ringers 10 mL/hr at 10/25/16 0700  . phenylephrine (NEO-SYNEPHRINE) Adult infusion 50 mcg/min (10/25/16 0700)  . propofol (DIPRIVAN) infusion 20 mcg/kg/min (10/25/16 0755)   PRN Meds:.sodium chloride, acetaminophen, docusate, fentaNYL (SUBLIMAZE) injection, midazolam, ondansetron (ZOFRAN) IV, potassium chloride (KCL MULTIRUN) 30 mEq in 265 mL IVPB  Heart: RRR Lungs: Clear on left and diminshed on right Extremities: SCDs in place Wound: Dressing is clean and dry Abdomen distend no bowel sounds , question of ascites   Lab Results: CBC: Recent Labs  10/11/2016 0413 10/25/2016 1107  WBC 23.4* 36.7*  HGB 11.1* 12.6*  HCT 32.1* 36.2*  PLT 215 280   BMET:  Recent Labs  10/10/2016 1107 10/25/16 0413  NA 136 139  K 3.7 3.6  CL 106 111  CO2 24 23  GLUCOSE 120* 159*  BUN 18 17  CREATININE 0.52* 0.48*  CALCIUM 7.9* 7.3*    CMET: Lab Results  Component Value Date   WBC 36.7 (H) 10/07/2016   HGB 12.6 (L) 10/19/2016   HCT 36.2 (L) 10/01/2016   PLT 280 10/25/2016   GLUCOSE 159 (H) 10/25/2016   TRIG 77 10/23/2016   ALT 24 10/14/2016  AST 54 (H) 10/12/2016   NA 139 10/25/2016   K 3.6 10/25/2016   CL 111 10/25/2016   CREATININE 0.48 (L) 10/25/2016   BUN 17 10/25/2016   CO2 23 10/25/2016   TSH 5.250 (H) 05/15/2014   INR 1.57 10/23/2016      PT/INR:  Recent Labs  10/23/16 1647  LABPROT 18.9*  INR 1.57   Radiology: Dg Chest Port 1 View  Result Date: 10/04/2016 CLINICAL DATA:  Empyema. 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 EXAM: PORTABLE CHEST 1 VIEW 11:02 a.m. COMPARISON:  Chest x-ray dated 10/09/2016 at 5:58 a.m. FINDINGS: Endotracheal tube, central line and NG tube appear in good position. The patient has undergone interval video-assisted thoracoscopy, decortication and drainage of right empyema. There are 2 new right chest tubes in place. Extra is re-expansion of the right lower lobe.  Persistent consolidation in the right upper lobe. Left lung is clear.  Heart size is normal. No pneumothorax. IMPRESSION: Re-expansion of the right lower lobe with no residual pneumothorax. No residual visible empyema. Consolidation of the right upper lobe remains. 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 Electronically Signed   By: Lorriane Shire M.D.   On: 10/19/2016 11:24     Assessment/Plan: S/P Procedure(s) (LRB): VIDEO BRONCHOSCOPY (N/A) VIDEO ASSISTED THORACOSCOPY (VATS)/DECORTICATION AND EMPYEMA (Right) 1. CV-On Neo synephrine drip. 2. Pulmonary-Chest tubes (3) with 380 cc of output. Chest tubes are to suction. There is a +1 continuous air leak. CXR this am shows no pneumothorax, consolidation upper right lung. Chest tubes to remain to suction for now. On vent. Management per CCM.  3. Supplement potassium 4. ID-On Azithromycin and Ceftriaxone for PNA 5. Malnutrition-on TFs 6. History of polysubstance abuse-monitor for withdrawal  ZIMMERMAN,DONIELLE M PA-C 10/25/2016 8:01 AM    Abdomen distended - not ileus pattern question ascites  Leave chest tube for now   Diagnosis 1. Lung, biopsy, RUL - NON SMALL CELL CARCINOMA. - SEE COMMENT. 2. Pleura, peel, Right - FIBRINOPURULENT MATERIAL. - SKELETAL MUSCLE. - THERE IS NO EVIDENCE OF MALIGNANCY. Microscopic Comment 1. The carcinoma appears poorly differentiated and is associated with abundant necrosis. The features favor squamous cell carcinoma. There may be sufficient tissue for additional studies, if requested. Dr. Claudette Laws has reviewed part 1 and concurs with this interpretation. Dr. Servando Snare was paged on 10/25/16. (JBK:kh 10/25/16)   I have seen and examined Irving Copas and agree with the above assessment  and plan.  Grace Isaac MD Beeper [phone] Office [phone] 10/25/2016 12:19 PM

## 2016-10-25 NOTE — Op Note (Signed)
NAME:  DEVYON, KEATOR NO.:  [id number]  MEDICAL RECORD NO.:  [id number]  LOCATION:                                 FACILITY:  PHYSICIAN:  Lanelle Bal, MD    DATE OF BIRTH:  1961-06-24  DATE OF PROCEDURE:  10/02/2016 DATE OF DISCHARGE:                              OPERATIVE REPORT   PREOPERATIVE DIAGNOSIS:  Right chest empyema.  POSTOPERATIVE DIAGNOSIS:  Right chest empyema with right upper lobe lung cancer.  PROCEDURE PERFORMED:  Bronchoscopy with brushings and biopsy of right upper lobe and right video-assisted thoracoscopy with drainage of empyema.  SURGEON:  Lanelle Bal, MD.  FIRST ASSISTANT:  Lars Pinks, PA.  BRIEF HISTORY:  The patient is a 56 year old male, who presented to the emergency room in and out over several days with pneumonia and respiratory distress.  Ultimately, he was brought by EMS to the emergency room, intubated.  CT scan of the chest was performed, which showed multiple white-out of the right lung with probable suggest of empyema.  Thoracic Surgery was consulted for consideration of drainage of the right chest with chest tube versus VATS.  The patient was intubated and sedated unresponsive.  Proceeding with bronchoscopy and video-assisted thoracoscopy was discussed with the patient's family, who signed informed consent for him.  DESCRIPTION OF PROCEDURE:  The patient was brought directly from the Surgical Intensive Care unit to the operating room through the previously placed endotracheal tube and after appropriate time-out was performed, a fiberoptic bronchoscopy was performed to the subsegmental level.  The left tracheobronchial tree was without endobronchial lesions.  On examination of the right, at the takeoff of the right upper lobe bronchus and involving the main stem bronchus, was an obstructive fungating lesion.  The remainder of the right tracheobronchial tree was without endobronchial lesions.  After  identifying this, we then performed brushings and biopsy of the lesion.  Initial cytologic smears were consistent with malignancy.  With multiple biopsies, we did open the area slightly.  The scope was then removed.  The patient was turned to lateral decubitus position with the right side up.  The right chest was prepped with Betadine and draped in sterile manner.  Anesthesia had changed the endotracheal tube with double-lumen endotracheal tube. Second time-out was performed and then we proceeded with placement of a port site midaxillary line at approximately 6th intercostal space.  A 30- degree scope was introduced into the right chest.  The right upper lobe was severely adherent to the chest wall.  The middle and lower lobes were collapsed with some stranding and adherence to the upper lobe. There were fibrinous adhesions throughout the chest and a significant amount of murky pleural fluid.  The fluid was removed.  Cultures were obtained through second port site.  The fibrinous pleural debris was debrided as much as possible.  Two Blake drains were left in place through the 2 port sites that had been used.  The lung was reinflated. The patient's sponge and needle count was reported as correct. Blood loss was minimal.  The patient tolerated the procedure without obvious complication.  He was turned back into supine position and the double-lumen endotracheal  tube was converted to a single-lumen tube and he was transferred directly back to the Surgical Intensive Care Unit.     Lanelle Bal, MD   ______________________________ Lanelle Bal, MD    EG/MEDQ  D:  10/25/2016  T:  10/25/2016  Job:  [id number]

## 2016-10-25 NOTE — Progress Notes (Signed)
RT NOTE:  ALINE dressing changed. BioPatch & new dressing placed. Sterile technique used. ALINE secure

## 2016-10-26 ENCOUNTER — Inpatient Hospital Stay (HOSPITAL_COMMUNITY): Payer: Medicaid Other

## 2016-10-26 DIAGNOSIS — C3491 Malignant neoplasm of unspecified part of right bronchus or lung: Secondary | ICD-10-CM

## 2016-10-26 DIAGNOSIS — K7011 Alcoholic hepatitis with ascites: Secondary | ICD-10-CM

## 2016-10-26 DIAGNOSIS — R140 Abdominal distension (gaseous): Secondary | ICD-10-CM

## 2016-10-26 DIAGNOSIS — K7031 Alcoholic cirrhosis of liver with ascites: Secondary | ICD-10-CM

## 2016-10-26 LAB — CBC WITH DIFFERENTIAL/PLATELET
Basophils Absolute: 0 10*3/uL (ref 0.0–0.1)
Basophils Relative: 0 %
Eosinophils Absolute: 0.3 10*3/uL (ref 0.0–0.7)
Eosinophils Relative: 1 %
HEMATOCRIT: 33.2 % — AB (ref 39.0–52.0)
Hemoglobin: 11 g/dL — ABNORMAL LOW (ref 13.0–17.0)
LYMPHS PCT: 9 %
Lymphs Abs: 2 10*3/uL (ref 0.7–4.0)
MCH: 30.1 pg (ref 26.0–34.0)
MCHC: 33.1 g/dL (ref 30.0–36.0)
MCV: 90.7 fL (ref 78.0–100.0)
MONO ABS: 1.4 10*3/uL — AB (ref 0.1–1.0)
MONOS PCT: 7 %
NEUTROS ABS: 18.2 10*3/uL — AB (ref 1.7–7.7)
Neutrophils Relative %: 83 %
PLATELETS: 192 10*3/uL (ref 150–400)
RBC: 3.66 MIL/uL — ABNORMAL LOW (ref 4.22–5.81)
RDW: 16.4 % — AB (ref 11.5–15.5)
WBC: 21.8 10*3/uL — ABNORMAL HIGH (ref 4.0–10.5)

## 2016-10-26 LAB — BODY FLUID CELL COUNT WITH DIFFERENTIAL
Eos, Fluid: 0 %
Lymphs, Fluid: 6 %
MONOCYTE-MACROPHAGE-SEROUS FLUID: 38 % — AB (ref 50–90)
Neutrophil Count, Fluid: 56 % — ABNORMAL HIGH (ref 0–25)
Total Nucleated Cell Count, Fluid: 28 cu mm (ref 0–1000)

## 2016-10-26 LAB — CULTURE, RESPIRATORY W GRAM STAIN: Culture: NORMAL

## 2016-10-26 LAB — COMPREHENSIVE METABOLIC PANEL
ALBUMIN: 1.2 g/dL — AB (ref 3.5–5.0)
ALK PHOS: 128 U/L — AB (ref 38–126)
ALT: 34 U/L (ref 17–63)
ANION GAP: 4 — AB (ref 5–15)
AST: 74 U/L — AB (ref 15–41)
BILIRUBIN TOTAL: 1 mg/dL (ref 0.3–1.2)
BUN: 13 mg/dL (ref 6–20)
CO2: 23 mmol/L (ref 22–32)
Calcium: 7.4 mg/dL — ABNORMAL LOW (ref 8.9–10.3)
Chloride: 113 mmol/L — ABNORMAL HIGH (ref 101–111)
Creatinine, Ser: 0.49 mg/dL — ABNORMAL LOW (ref 0.61–1.24)
GFR calc Af Amer: >60 mL/min (ref >60)
GFR calc non Af Amer: >60 mL/min (ref >60)
GLUCOSE: 102 mg/dL — AB (ref 65–99)
POTASSIUM: 4.4 mmol/L (ref 3.5–5.1)
Sodium: 140 mmol/L (ref 135–145)
TOTAL PROTEIN: 5.8 g/dL — AB (ref 6.5–8.1)

## 2016-10-26 LAB — GLUCOSE, CAPILLARY
GLUCOSE-CAPILLARY: 111 mg/dL — AB (ref 65–99)
GLUCOSE-CAPILLARY: 110 mg/dL — AB (ref 65–99)
Glucose-Capillary: 116 mg/dL — ABNORMAL HIGH (ref 65–99)
Glucose-Capillary: 80 mg/dL (ref 65–99)
Glucose-Capillary: 88 mg/dL (ref 65–99)
Glucose-Capillary: 92 mg/dL (ref 65–99)

## 2016-10-26 LAB — PATHOLOGIST SMEAR REVIEW

## 2016-10-26 LAB — CULTURE, BLOOD (ROUTINE X 2): Culture: NO GROWTH

## 2016-10-26 LAB — MAGNESIUM: Magnesium: 2.1 mg/dL (ref 1.7–2.4)

## 2016-10-26 LAB — PHOSPHORUS: Phosphorus: 3.6 mg/dL (ref 2.5–4.6)

## 2016-10-26 LAB — ALBUMIN: ALBUMIN: 1.2 g/dL — AB (ref 3.5–5.0)

## 2016-10-26 LAB — TRIGLYCERIDES: Triglycerides: 42 mg/dL (ref <150)

## 2016-10-26 MED ORDER — ALBUMIN HUMAN 5 % IV SOLN
INTRAVENOUS | Status: AC
  Administered 2016-10-26: 14:00:00
  Filled 2016-10-26: qty 250

## 2016-10-26 MED ORDER — BISACODYL 10 MG RE SUPP
10.0000 mg | Freq: Once | RECTAL | Status: AC
  Administered 2016-10-26: 10 mg via RECTAL
  Filled 2016-10-26: qty 1

## 2016-10-26 MED ORDER — SODIUM CHLORIDE 0.9% FLUSH
10.0000 mL | Freq: Two times a day (BID) | INTRAVENOUS | Status: DC
  Administered 2016-10-26 – 2016-10-29 (×7): 10 mL
  Administered 2016-10-30: 20 mL
  Administered 2016-10-31 – 2016-11-02 (×4): 10 mL
  Administered 2016-11-03: 40 mL

## 2016-10-26 MED ORDER — MIDAZOLAM BOLUS VIA INFUSION
1.0000 mg | INTRAVENOUS | Status: DC | PRN
  Filled 2016-10-26: qty 4

## 2016-10-26 MED ORDER — SODIUM CHLORIDE 0.9 % IV SOLN
1.0000 mg/h | INTRAVENOUS | Status: DC
  Administered 2016-10-26: 8 mg/h via INTRAVENOUS
  Administered 2016-10-26: 1 mg/h via INTRAVENOUS
  Administered 2016-10-26: 8 mg/h via INTRAVENOUS
  Administered 2016-10-27: 7 mg/h via INTRAVENOUS
  Administered 2016-10-27: 8 mg/h via INTRAVENOUS
  Administered 2016-10-27: 7 mg/h via INTRAVENOUS
  Filled 2016-10-26 (×6): qty 10

## 2016-10-26 MED ORDER — CHLORHEXIDINE GLUCONATE CLOTH 2 % EX PADS
6.0000 | MEDICATED_PAD | Freq: Every day | CUTANEOUS | Status: DC
  Administered 2016-10-26 – 2016-11-02 (×8): 6 via TOPICAL

## 2016-10-26 MED ORDER — SODIUM CHLORIDE 0.9% FLUSH: 10.0000 mL | INTRAVENOUS | Status: DC | PRN

## 2016-10-26 MED ORDER — ALBUMIN HUMAN 25 % IV SOLN
25.0000 g | Freq: Once | INTRAVENOUS | Status: AC
  Administered 2016-10-26: 25 g via INTRAVENOUS

## 2016-10-26 NOTE — Progress Notes (Addendum)
TCTS DAILY ICU PROGRESS NOTE                   [address]            [place] [address]          [phone]   2 Days Post-Op Procedure(s) (LRB): VIDEO BRONCHOSCOPY (N/A) VIDEO ASSISTED THORACOSCOPY (VATS)/DECORTICATION AND EMPYEMA (Right)  Total Length of Stay:  LOS: 3 days   Subjective: Patient more awake, on vent  Objective: Vital signs in last 24 hours: Temp:  [96.6 F (35.9 C)-98.4 F (36.9 C)] 97.7 F (36.5 C) (03/01 0400) Pulse Rate:  [51-85] 83 (03/01 0700) Cardiac Rhythm: Normal sinus rhythm (03/01 0400) Resp:  [0-28] 16 (03/01 0700) BP: (89-122)/(51-77) 93/70 (03/01 0700) SpO2:  [97 %-100 %] 97 % (03/01 0700) Arterial Line BP: (65-120)/(52-74) 100/56 (03/01 0700) FiO2 (%):  [40 %] 40 % (03/01 0454) Weight:  [166 lb 7.2 oz (75.5 kg)] 166 lb 7.2 oz (75.5 kg) (03/01 0500)  Filed Weights   10/16/2016 0420 10/25/16 0500 10/26/16 0500  Weight: 147 lb 11.3 oz (67 kg) 154 lb 5.2 oz (70 kg) 166 lb 7.2 oz (75.5 kg)    Weight change: 12 lb 2 oz (5.5 kg)      Intake/Output from previous day: 02/28 0701 - 03/01 0700 In: 3583.5 [I.V.:3283.5; NG/GT:150; IV Piggyback:150] Out: 5643 [Urine:1050; Emesis/NG output:300; Chest Tube:220]  Intake/Output this shift: No intake/output data recorded.  Current Meds: Scheduled Meds: . acetaminophen  1,000 mg Oral Q6H   Or  . acetaminophen (TYLENOL) oral liquid 160 mg/5 mL  1,000 mg Oral Q6H  . cefTRIAXone (ROCEPHIN)  IV  2 g Intravenous Q0600  . chlorhexidine gluconate (MEDLINE KIT)  15 mL Mouth Rinse BID  . Chlorhexidine Gluconate Cloth  6 each Topical Daily  . enoxaparin (LOVENOX) injection  40 mg Subcutaneous Q24H  . famotidine (PEPCID) IV  20 mg Intravenous Q12H  . folic acid  1 mg Intravenous Daily  . ipratropium-albuterol  3 mL Nebulization Q6H  . mouth rinse  15 mL Mouth Rinse 10 times per day  . multivitamin  15 mL Per Tube Daily  . nicotine  14 mg Transdermal Daily  . sodium chloride flush  10-40  mL Intracatheter Q12H  . thiamine injection  100 mg Intravenous Daily   Continuous Infusions: . dextrose 5 % and 0.9 % NaCl with KCl 20 mEq/L 100 mL/hr at 10/26/16 0700  . feeding supplement (VITAL AF 1.2 CAL) Stopped (10/25/16 0940)  . lactated ringers 20 mL/hr at 10/26/16 0700  . phenylephrine (NEO-SYNEPHRINE) Adult infusion 20 mcg/min (10/26/16 0700)  . propofol (DIPRIVAN) infusion 20.01 mcg/kg/min (10/26/16 0700)   PRN Meds:.sodium chloride, acetaminophen, docusate, fentaNYL (SUBLIMAZE) injection, midazolam, ondansetron (ZOFRAN) IV, potassium chloride (KCL MULTIRUN) 30 mEq in 265 mL IVPB, sodium chloride flush  Heart: RRR Lungs: Clear on left and diminshed on right Abdomen: Distention, rare bowel sounds, question of ascites  Extremities: SCDs in place Wound: Dressing is clean and dry Chest tubes: to suction, no air leak  Lab Results: CBC:  Recent Labs  10/25/16 1011 10/26/16 0500  WBC 22.6* 21.8*  HGB 10.5* 11.0*  HCT 31.7* 33.2*  PLT 204 192   BMET:   Recent Labs  10/25/16 0413 10/26/16 0500  NA 139 140  K 3.6 4.4  CL 111 113*  CO2 23 23  GLUCOSE 159* 102*  BUN 17 13  CREATININE 0.48* 0.49*  CALCIUM 7.3* 7.4*    CMET:  Lab Results  Component Value Date   WBC 21.8 (H) 10/26/2016   HGB 11.0 (L) 10/26/2016   HCT 33.2 (L) 10/26/2016   PLT 192 10/26/2016   GLUCOSE 102 (H) 10/26/2016   TRIG 42 10/26/2016   ALT 34 10/26/2016   AST 74 (H) 10/26/2016   NA 140 10/26/2016   K 4.4 10/26/2016   CL 113 (H) 10/26/2016   CREATININE 0.49 (L) 10/26/2016   BUN 13 10/26/2016   CO2 23 10/26/2016   TSH 5.250 (H) 05/15/2014   INR 1.57 10/23/2016      PT/INR:   Recent Labs  10/23/16 1647  LABPROT 18.9*  INR 1.57   Radiology: Dg Abd Portable 1v  Result Date: 10/25/2016 CLINICAL DATA:  Abdominal distension, postop day 1 VATS EXAM: PORTABLE ABDOMEN - 1 VIEW COMPARISON:  None. FINDINGS: Normal small bowel gas pattern. Some colonic stool and gas noted without  significant colonic distension. NG tube in place with tip in proximal stomach. No evidence of free abdominal air. IMPRESSION: Normal small bowel gas pattern. Some colonic stool and gas noted without significant colonic distension Electronically Signed   By: Lahoma Crocker M.D.   On: 10/25/2016 10:11     Assessment/Plan: S/P Procedure(s) (LRB): VIDEO BRONCHOSCOPY (N/A) VIDEO ASSISTED THORACOSCOPY (VATS)/DECORTICATION AND EMPYEMA (Right) 1. CV-On Neo synephrine drip for hypotension. 2. Pulmonary-Chest tubes (3) with 220 cc of output. Chest tubes are to suction. There is no air leak. CXR appears stable. Chest tubes to remain to suction for now. On vent-management per CCM. Pathology of lung is NSCC. 3. Supplement potassium 4. ID-On Ceftriaxone for PNA. Right pleural fluid shows no growth yet 5. Malnutrition- TFs on hold 6. History of polysubstance abuse-monitor for withdrawal.  7. Anemia-H and H increased to 11 and 33.2  ZIMMERMAN,DONIELLE M PA-C 10/26/2016 7:20 AM    Leave chest tubes for now Likely ascites on exam I have seen and examined Mario Andrews and agree with the above assessment  and plan.  Grace Isaac MD Beeper [phone] Office [phone] 10/26/2016 7:58 AM

## 2016-10-26 NOTE — Progress Notes (Addendum)
[facility] Attending Note  Presenting HPI:  56 y.o. male with a history of tobacco abuse, snake bite, cocaine abuse, hepatitis C came to the [facility] emergency department on 10/18/2016 complaining of shortness of breath. He had been seen one day prior for pneumonia and it was recommended that he be admitted for further treatment but he left AGAINST MEDICAL ADVICE. He had been prescribed Levaquin. He took the antibiotics, but he stated that his dyspnea progressed so he called 911. In the emergency department his breathing improved briefly with the application of positive pressure ventilation but his respiratory status continued to decline so he was intubated by the emergency room physicians around 1:30 AM. On my exam he was intubated and could not provide further history. History was obtained from chart review and talking to other healthcare providers.  Subjective:  No acute events overnight. Tube feedings remained on hold. Patient is having more notable tremors per nurse. Reportedly patient following commands earlier.  Review of Systems:  Unable to obtain as patient is intubated and sedated.  Vent Mode: PSV;CPAP FiO2 (%):  [40 %] 40 % Set Rate:  [26 bmp] 26 bmp Vt Set:  [560 mL] 560 mL PEEP:  [5 cmH20] 5 cmH20 Pressure Support:  [14 cmH20] 14 cmH20 Plateau Pressure:  [18 cmH20-24 cmH20] 18 cmH20  Temp:  [96.6 F (35.9 C)-98.4 F (36.9 C)] 97.7 F (36.5 C) (03/01 0400) Pulse Rate:  [51-85] 83 (03/01 0700) Resp:  [0-28] 16 (03/01 0700) BP: (89-122)/(51-77) 93/70 (03/01 0700) SpO2:  [97 %-100 %] 97 % (03/01 0805) Arterial Line BP: (65-120)/(52-74) 100/56 (03/01 0700) FiO2 (%):  [40 %] 40 % (03/01 0805) Weight:  [166 lb 7.2 oz (75.5 kg)] 166 lb 7.2 oz (75.5 kg) (03/01 0500)  Gen.: Laying in bed. Sedated. No family at bedside. Integument: Warm and dry. No rash on exposed skin. Right-sided dressings are clean and dry. Neurological: Pupils symmetric and reactive. Eyes  closed. No spontaneous movements. Abdomen: Soft but protuberant. Distant bowel sounds. Pulmonary: Improved aeration bilaterally. Symmetric chest wall rise on ventilator. Right chest tubes in place. Cardiovascular: Regular rate and rhythm. Anasarca persists. Sinus rhythm on telemetry. HEENT: No scleral injection. Endotracheal tube in place.  LINES/TUBES: OETT 7.5 2/26 >>> Foley 2/26 >>> OGT 2/26 >>> Right Chest Tube x2 2/27 >>> R IJ CVL 2/26 >>> R Radial Art Line 2/27 >>> PIV  CBC Latest Ref Rng & Units 10/26/2016 10/25/2016 10/10/2016  WBC 4.0 - 10.5 K/uL 21.8(H) 22.6(H) 36.7(H)  Hemoglobin 13.0 - 17.0 g/dL 11.0(L) 10.5(L) 12.6(L)  Hematocrit 39.0 - 52.0 % 33.2(L) 31.7(L) 36.2(L)  Platelets 150 - 400 K/uL 192 204 280   BMP Latest Ref Rng & Units 10/26/2016 10/25/2016 10/01/2016  Glucose 65 - 99 mg/dL 102(H) 159(H) 120(H)  BUN 6 - 20 mg/dL _0 Creatinine 0.61 - 1.24 mg/dL 0.49(L) 0.48(L) 0.52(L)  Sodium 135 - 145 mmol/L 140 139 136  Potassium 3.5 - 5.1 mmol/L 4.4 3.6 3.7  Chloride 101 - 111 mmol/L 113(H) 111 106  CO2 22 - 32 mmol/L _1 Calcium 8.9 - 10.3 mg/dL 7.4(L) 7.3(L) 7.9(L)   Hepatic Function Latest Ref Rng & Units 10/26/2016 10/07/2016 10/23/2016  Total Protein 6.5 - 8.1 g/dL 5.8(L) 6.2(L) 5.6(L)  Albumin 3.5 - 5.0 g/dL 1.2(L) 1.4(L) 1.3(L)  AST 15 - 41 U/L 74(H) 54(H) 57(H)  ALT 17 - 63 U/L 34 24 22  Alk Phosphatase 38 - 126 U/L 128(H) 117 107  Total Bilirubin 0.3 - 1.2 mg/dL 1.0 1.1 1.2  Bilirubin, Direct 0.1 - 0.5 mg/dL - 0.5 -    IMAGING/STUDIES: CT CHEST W/O 2/26:  Previously reviewed by me. Liver cirrhosis with a small amount of ascites. Enlarged precarinal lymph nodes. Emphysema noted predominantly apical on the left. Dense consolidation on the right with pockets and what seems to be air-fluid levels. Appears to have a pleural effusion on the right as well. TTE 2/27:Poor quality imaging. LV cavity size normal. EF 55-60% with no regional wall motion  abnormalities. Unable to assess diastolic function. No obvious aortic stenosis or regurgitation with poorly visualized valve. Aortic root normal in size. No significant mitral regurgitation. Left atrium normal in size. Right ventricle and right Atrium poorly visualized. Moderate-sized pericardial effusion apically and posterior to the heart. 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  PORT CXR 2/28:  Personally reviewed by me. Right-sided chest tubes in good position. Endotracheal and enteric tube is in good position. Persistent opacification right upper lobe with improved aeration right lung base. PORT ABD X-RAY 2/28:  Normal small bowel gas pattern. Some colonic stool and gas noted without significant colonic distension. PORT CXR 3/1:  Personally reviewed by me. Right-sided chest tubes remain in good position. Continued right upper lobe opacification. Endotracheal tube and enteric feeding tube in good position. Central venous catheter in good position.  MICROBIOLOGY: HIV 1/2 2/23:  Nonreactive Urine Culture 2/23:  <10,000 insignificant growth Blood Culture x1 2/24 >>> MRSA PCR 2/26:  Negative  Blood Cultures x2 2/25 >>> Tracheal Aspirate Culture 2/26:  Normal flora  Respiratory Panel PCR 2/26:  Negative  Influenza A/B PCR 2/26:  Negative  Urine Streptococcal Antigen 2/26:  Negative  Urine Legionella Antigen 2/26:  Negative  Bronchial Wash Right 2/27 >>> Right Pleural Fluid Culture 2/27 >>>  ANTIBIOTICS: Cefepime 2/26 (1 dose) Vancomycin 2/26 (1 dose) Tamiflu 2/26 (stopped) Azithromycin 2/26 >>> Rocephin 2/26 >>>  SIGNIFICANT EVENTS: 02/26 - Admit 02/27 - VATS w/ decortication & bronchoscopy by Dr. Servando Snare  ASSESSMENT/PLAN:  56 y.o. male with known history of liver  cirrhosis and chronic polysubstance abuse. Presents with complicated parapneumonic effusion and right sided pneumonia. Now diagnosed with non-small cell lung cancer of the right upper lobe.   1. Severe community acquired pneumonia/postobstructive pneumonia: Continuing Rocephin day #4. Awaiting finalization of cultures. 2. Severe sepsis with septic shock: Secondary to pneumonia. Trending Procalcitonin per algorithm. Ongoing shock likely secondary to sedation. Awaiting finalization of cultures. 3. Acute hypoxic respiratory failure: Multifactorial from pneumonia. Continuing ventilator support. Continuing Duonebs every 6 hours. 4. Polysubstance abuse/alcohol withdrawal: Switching from propofol to Versed drip. Continuing fentanyl. Continuing thiamine and folic acid IV daily. Continuing nicotine patch daily. 5. Abdominal distention: Suspect secondary to developing ascites. Holding on diuresis given borderline blood pressure. Continuing NG tube to low intermittent suction. Ordering rectal suppository. Plan for bedside abdominal ultrasound to evaluate for ascites. 6. Elevated troponin I: Likely demand ischemia. Poor quality echocardiogram. Hold off on systemic anticoagulation. 7. Pericardial effusion: No obvious physiology to suggest tamponade. Monitoring closely with vitals per your protocol. 8. Right-sided complicated parapneumonic effusion/hydropneumothorax: Status post video-assisted thoracoscopic surgery and chest tube placement. Cultures pending. Chest tube management per thoracic surgery. 9. Liver cirrhosis with ascites: Evaluating for possible paracentesis today.  10. Right upper lobe non-small cell lung  cancer: Diagnosed with endobronchial biopsy. No malignancy from pleural peel pathology or pleural fluid cytology. Patient will need appropriate staging if he survives this with PET/CT imaging. 11. Moderate malnutrition: Holding on tube feeds for now.  Prophylaxis:  Pepcid IV q12hr, SCDs, & Lovenox [place]  daily.  Diet:  NPO. Holding tube feedings. Code Status: Full code per previous physician discussions. Disposition: Remains critically ill and endotracheally intubated in the intensive care unit. Family Update:  No family at bedside on rounds today.  I have spent a total of 31 minutes of critical care time today caring for the patient and reviewing the patient's electronic medical record.  Sonia Baller Ashok Cordia, M.D. [facility] Pager:  [phone] After 3pm or if no response, call [phone] 8:10 AM 10/26/16

## 2016-10-26 NOTE — Progress Notes (Signed)
Order is for RR of 14bpm on 2/26

## 2016-10-26 NOTE — Care Management Note (Addendum)
Case Management Note  Patient Details  Name: Mario Andrews MRN: [id number] Date of Birth: Jan 20, 1961  Subjective/Objective:  Acute resp failure (intubated)  with hypoxemia secondary to severe CAP and Cirrhosis , has hx of ETOH and cocaine..  Failed outpatient oral therapy.  Brother was visiting this am, his name is Mario Andrews [place] said that patient lives with bother Mario Andrews and sister Mario Andrews but they are using (drugs) so he prefers for [place] to speak with him regarding patient.  He also said not to speak with Mario Andrews the other sister.   Patient has [employer], he has no PCP. NCM will cont to follow for dc needs.     2/27 [place], BSN - s/p VATS Deocrticaiton, drain r empyema, conts on vent, may need ? Snf, chest tubes x 3 to suction, ? Lung ca.  CSW consulted.   NCM will cont to follow.   3/1 [place], BSN - patient with hx of liver cirrhosis and chronic psa, has complicated parppneumonic effusion and r sided pna, now with lung ca, severe CAP, septic shock, conts on vent, abd distention ( ascites) for paracentesis today, tube feeds on hold.  Has ng tube to low wall suction. NCM will cont to follow for dc needs, there is a CSW consult also.  3/6 [place], BSN - concerining the [place] for patient, MD spoke with patient's brother, Mario Andrews, he would like for everything to be done that can possibly be done he remains a full code.  He will most likely need a trach and a snf., conts on vent, neo drip,   cont tube feeds. CSW has been consulted.                          Action/Plan:   Expected Discharge Date:                  Expected Discharge Plan:  Skilled Nursing Facility  In-House Referral:  Clinical Social Work  Discharge planning Services  CM Consult  Post Acute Care Choice:    Choice offered to:     DME Arranged:    DME Agency:     HH Arranged:    [place] Agency:     Status of Service:  In process, will continue to  follow  If discussed at Long Length of Stay Meetings, dates discussed:    Additional Comments:  Zenon Mayo, RN 10/26/2016, 11:40 AM

## 2016-10-26 NOTE — Progress Notes (Signed)
Patient ID: Mario Andrews, male   DOB: 09-20-1960, 56 y.o.   MRN: [id number]  SICU Evening Rounds:  He is hemodynamically stable on neo 30 mcg.   Remains sedated on vent  Urine output ok  CT output low.

## 2016-10-26 NOTE — Procedures (Signed)
Paracentesis Procedure Note  Pre-operative Diagnosis: Ascites  Post-operative Diagnosis: same  Indications: Diagnostic & Therapeutic  Procedure Details  Consent: Informed consent was obtained. Risks of the procedure were discussed including: infection, bleeding, pain, pneumothorax.  Bedside POCUS was used to assess peritoneal cavity.  Large amount of ascitic fluid noted - see images below. Under sterile conditions the patient was positioned. Betadine solution and sterile drapes were utilized.  1% buffered lidocaine was used to anesthetize the LLQ. Fluid was obtained without any difficulties and minimal blood loss.  A dressing was applied to the wound and wound care instructions were provided.   Peritoneal Cavity - LLQ:    Findings 4000 ml of clear yellow peritoneal fluid was obtained. A sample was sent to Pathology for cytogenetics, flow, and cell counts, as well as for infection analysis.  Complications:  None; patient tolerated the procedure well.          Condition: stable   Montey Hora, [place] - [facility] [place] [facility] Pager: [phone]  or [phone] 10/26/2016, 12:04 PM

## 2016-10-26 DEATH — deceased

## 2016-10-27 ENCOUNTER — Inpatient Hospital Stay (HOSPITAL_COMMUNITY): Payer: Medicaid Other

## 2016-10-27 DIAGNOSIS — R188 Other ascites: Secondary | ICD-10-CM

## 2016-10-27 LAB — CBC WITH DIFFERENTIAL/PLATELET
BASOS ABS: 0 10*3/uL (ref 0.0–0.1)
BASOS PCT: 0 %
EOS ABS: 0.4 10*3/uL (ref 0.0–0.7)
EOS PCT: 2 %
HCT: 31.2 % — ABNORMAL LOW (ref 39.0–52.0)
Hemoglobin: 10.2 g/dL — ABNORMAL LOW (ref 13.0–17.0)
Lymphocytes Relative: 8 %
Lymphs Abs: 1.6 10*3/uL (ref 0.7–4.0)
MCH: 29.7 pg (ref 26.0–34.0)
MCHC: 32.7 g/dL (ref 30.0–36.0)
MCV: 91 fL (ref 78.0–100.0)
MONO ABS: 1.3 10*3/uL — AB (ref 0.1–1.0)
MONOS PCT: 6 %
Neutro Abs: 17.6 10*3/uL — ABNORMAL HIGH (ref 1.7–7.7)
Neutrophils Relative %: 84 %
PLATELETS: 147 10*3/uL — AB (ref 150–400)
RBC: 3.43 MIL/uL — ABNORMAL LOW (ref 4.22–5.81)
RDW: 16.8 % — AB (ref 11.5–15.5)
WBC: 20.9 10*3/uL — ABNORMAL HIGH (ref 4.0–10.5)

## 2016-10-27 LAB — BODY FLUID CULTURE: Culture: NO GROWTH

## 2016-10-27 LAB — GLUCOSE, CAPILLARY
GLUCOSE-CAPILLARY: 105 mg/dL — AB (ref 65–99)
GLUCOSE-CAPILLARY: 110 mg/dL — AB (ref 65–99)
GLUCOSE-CAPILLARY: 111 mg/dL — AB (ref 65–99)
GLUCOSE-CAPILLARY: 107 mg/dL — AB (ref 65–99)
Glucose-Capillary: 93 mg/dL (ref 65–99)
Glucose-Capillary: 117 mg/dL — ABNORMAL HIGH (ref 65–99)

## 2016-10-27 LAB — CULTURE, BLOOD (ROUTINE X 2)
CULTURE: NO GROWTH
Culture: NO GROWTH

## 2016-10-27 LAB — ALBUMIN, FLUID (OTHER): ALBUMIN, BODY FLUID OTHER: 0.2 g/dL

## 2016-10-27 LAB — RENAL FUNCTION PANEL
ALBUMIN: 1.5 g/dL — AB (ref 3.5–5.0)
ANION GAP: 5 (ref 5–15)
BUN: 13 mg/dL (ref 6–20)
CALCIUM: 7.8 mg/dL — AB (ref 8.9–10.3)
CO2: 23 mmol/L (ref 22–32)
CREATININE: 0.38 mg/dL — AB (ref 0.61–1.24)
Chloride: 114 mmol/L — ABNORMAL HIGH (ref 101–111)
GFR calc Af Amer: >60 mL/min (ref >60)
GFR calc non Af Amer: >60 mL/min (ref >60)
GLUCOSE: 103 mg/dL — AB (ref 65–99)
Phosphorus: 3.7 mg/dL (ref 2.5–4.6)
Potassium: 3.6 mmol/L (ref 3.5–5.1)
SODIUM: 142 mmol/L (ref 135–145)

## 2016-10-27 LAB — MAGNESIUM: MAGNESIUM: 2.2 mg/dL (ref 1.7–2.4)

## 2016-10-27 MED ORDER — VITAL AF 1.2 CAL PO LIQD
1000.0000 mL | ORAL | Status: DC
  Administered 2016-10-27 – 2016-10-29 (×3): 1000 mL
  Filled 2016-10-27 (×4): qty 1000

## 2016-10-27 MED ORDER — DEXMEDETOMIDINE HCL IN NACL 400 MCG/100ML IV SOLN
0.4000 ug/kg/h | INTRAVENOUS | Status: DC
  Administered 2016-10-27: 0.4 ug/kg/h via INTRAVENOUS
  Administered 2016-10-28: 0.9 ug/kg/h via INTRAVENOUS
  Administered 2016-10-28: 0.3 ug/kg/h via INTRAVENOUS
  Administered 2016-10-28 – 2016-11-01 (×16): 1.2 ug/kg/h via INTRAVENOUS
  Filled 2016-10-27 (×17): qty 100

## 2016-10-27 NOTE — Progress Notes (Signed)
Patient ID: Mario Andrews, male   DOB: 08/11/1961, 56 y.o.   MRN: [id number] TCTS DAILY ICU PROGRESS NOTE                   [address]            [place] [address]          [phone]   3 Days Post-Op Procedure(s) (LRB): VIDEO BRONCHOSCOPY (N/A) VIDEO ASSISTED THORACOSCOPY (VATS)/DECORTICATION AND EMPYEMA (Right)  Total Length of Stay:  LOS: 4 days   Subjective: Sedated on vent   Objective: Vital signs in last 24 hours: Temp:  [97.2 F (36.2 C)-97.8 F (36.6 C)] 97.7 F (36.5 C) (03/02 0400) Pulse Rate:  [65-96] 66 (03/02 0700) Cardiac Rhythm: Normal sinus rhythm (03/02 0400) Resp:  [9-30] 18 (03/02 0700) BP: (87-108)/(55-73) 87/61 (03/02 0700) SpO2:  [94 %-100 %] 99 % (03/02 0737) Arterial Line BP: (102-124)/(52-68) 106/53 (03/02 0700) FiO2 (%):  [40 %] 40 % (03/02 0744) Weight:  [157 lb 3 oz (71.3 kg)] 157 lb 3 oz (71.3 kg) (03/02 0500)  Filed Weights   10/25/16 0500 10/26/16 0500 10/27/16 0500  Weight: 154 lb 5.2 oz (70 kg) 166 lb 7.2 oz (75.5 kg) 157 lb 3 oz (71.3 kg)    Weight change: -9 lb 4.2 oz (-4.2 kg)   Hemodynamic parameters for last 24 hours:    Intake/Output from previous day: 03/01 0701 - 03/02 0700 In: 3244.5 [I.V.:3114.5; NG/GT:30; IV Piggyback:100] Out: 1640 [Urine:970; Emesis/NG output:500; Chest Tube:170]  Intake/Output this shift: No intake/output data recorded.  Current Meds: Scheduled Meds: . acetaminophen  1,000 mg Oral Q6H   Or  . acetaminophen (TYLENOL) oral liquid 160 mg/5 mL  1,000 mg Oral Q6H  . cefTRIAXone (ROCEPHIN)  IV  2 g Intravenous Q0600  . chlorhexidine gluconate (MEDLINE KIT)  15 mL Mouth Rinse BID  . Chlorhexidine Gluconate Cloth  6 each Topical Daily  . enoxaparin (LOVENOX) injection  40 mg Subcutaneous Q24H  . famotidine (PEPCID) IV  20 mg Intravenous Q12H  . feeding supplement (VITAL AF 1.2 CAL)  1,000 mL Per Tube Q24H  . folic acid  1 mg Intravenous Daily  . ipratropium-albuterol  3 mL  Nebulization Q6H  . mouth rinse  15 mL Mouth Rinse 10 times per day  . multivitamin  15 mL Per Tube Daily  . nicotine  14 mg Transdermal Daily  . sodium chloride flush  10-40 mL Intracatheter Q12H  . thiamine injection  100 mg Intravenous Daily   Continuous Infusions: . dextrose 5 % and 0.9 % NaCl with KCl 20 mEq/L 100 mL/hr at 10/26/16 2359  . lactated ringers Stopped (10/26/16 1900)  . midazolam (VERSED) infusion 8 mg/hr (10/27/16 0306)  . phenylephrine (NEO-SYNEPHRINE) Adult infusion 20 mcg/min (10/26/16 2000)  . propofol (DIPRIVAN) infusion 30 mcg/kg/min (10/27/16 0306)   PRN Meds:.sodium chloride, acetaminophen, docusate, fentaNYL (SUBLIMAZE) injection, midazolam, midazolam, ondansetron (ZOFRAN) IV, potassium chloride (KCL MULTIRUN) 30 mEq in 265 mL IVPB, sodium chloride flush  General appearance: sedated on vent  Neurologic: unknown Heart: regular rate and rhythm, S1, S2 normal, no murmur, click, rub or gallop Lungs: diminished breath sounds RUL Abdomen: soft, non-tender; bowel sounds normal; no masses,  no organomegaly and less distended  Extremities: extremities normal, atraumatic, no cyanosis or edema and Homans sign is negative, no sign of DVT Wound: chest tube in place   Lab Results: CBC: Recent Labs  10/26/16 0500 10/27/16 0335  WBC 21.8* 20.9*  HGB 11.0* 10.2*  HCT 33.2* 31.2*  PLT 192 147*   BMET:  Recent Labs  10/26/16 0500 10/27/16 0335  NA 140 142  K 4.4 3.6  CL 113* 114*  CO2 23 23  GLUCOSE 102* 103*  BUN 13 13  CREATININE 0.49* 0.38*  CALCIUM 7.4* 7.8*    CMET: Lab Results  Component Value Date   WBC 20.9 (H) 10/27/2016   HGB 10.2 (L) 10/27/2016   HCT 31.2 (L) 10/27/2016   PLT 147 (L) 10/27/2016   GLUCOSE 103 (H) 10/27/2016   TRIG 42 10/26/2016   ALT 34 10/26/2016   AST 74 (H) 10/26/2016   NA 142 10/27/2016   K 3.6 10/27/2016   CL 114 (H) 10/27/2016   CREATININE 0.38 (L) 10/27/2016   BUN 13 10/27/2016   CO2 23 10/27/2016   TSH 5.250  (H) 05/15/2014   INR 1.57 10/23/2016      PT/INR: No results for input(s): LABPROT, INR in the last 72 hours. Radiology: No results found.   Assessment/Plan: S/P Procedure(s) (LRB): VIDEO BRONCHOSCOPY (N/A) VIDEO ASSISTED THORACOSCOPY (VATS)/DECORTICATION AND EMPYEMA (Right) Vent and cr tical care per ccm Leave chest tube for now      Grace Isaac 10/27/2016 7:55 AM

## 2016-10-27 NOTE — Progress Notes (Signed)
[facility] Attending Note  Presenting HPI:  56 y.o. male with a history of tobacco abuse, snake bite, cocaine abuse, hepatitis C came [facility] emergency department on 10/21/2016 complaining of shortness of breath. He had been seen one day prior for pneumonia and it was recommended that he be admitted for further treatment but he left AGAINST MEDICAL ADVICE. He had been prescribed Levaquin. He took the antibiotics, but he stated that his dyspnea progressed so he called 911. In the emergency department his breathing improved briefly with the application of positive pressure ventilation but his respiratory status continued to decline so he was intubated by the emergency room physicians around 1:30 AM. On my exam he was intubated and could not provide further history. History was obtained from chart review and talking to other healthcare providers.  Subjective:  Nurse reports continued sedation with propofol and Versed this morning. Did not attempt weaning propofol overnight. Patient is passing gas but no stools.  Review of Systems:  Unable to obtain as patient is currently intubated and sedated.  Vent Mode: PRVC FiO2 (%):  [40 %] 40 % Set Rate:  [14 bmp-26 bmp] 14 bmp Vt Set:  [560 mL] 560 mL PEEP:  [5 cmH20] 5 cmH20 Plateau Pressure:  [18 cmH20-26 cmH20] 26 cmH20  Temp:  [97.2 F (36.2 C)-97.8 F (36.6 C)] 97.7 F (36.5 C) (03/02 0400) Pulse Rate:  [65-96] 70 (03/02 1100) Resp:  [9-30] 21 (03/02 1100) BP: (87-108)/(55-72) 90/65 (03/02 1100) SpO2:  [94 %-100 %] 99 % (03/02 1100) Arterial Line BP: (101-124)/(49-68) 104/51 (03/02 1100) FiO2 (%):  [40 %] 40 % (03/02 0744) Weight:  [157 lb 3 oz (71.3 kg)] 157 lb 3 oz (71.3 kg) (03/02 0500)  Gen.: Laying in bed. No distress. No family at bedside. Integument: Warm and dry. No rash on exposed skin. Abdomen: Soft. Less protuberant. Normal bowel sounds. Neurological: Pupils symmetric and reactive. No spontaneous  movements. Sedated. Pulmonary: Good aeration bilaterally. Mild wheeze. Symmetric chest wall rise on ventilator. Right chest tube in place. Cardiovascular: Regular rate. Pitting lower extremity edema unchanged. No appreciated JVD.   LINES/TUBES: OETT 7.5 2/26 >>> Foley 2/26 >>> OGT 2/26 >>> Right Chest Tube x2 2/27 >>> R IJ CVL 2/26 >>> R Radial Art Line 2/27 >>> PIV  CBC Latest Ref Rng & Units 10/27/2016 10/26/2016 10/25/2016  WBC 4.0 - 10.5 K/uL 20.9(H) 21.8(H) 22.6(H)  Hemoglobin 13.0 - 17.0 g/dL 10.2(L) 11.0(L) 10.5(L)  Hematocrit 39.0 - 52.0 % 31.2(L) 33.2(L) 31.7(L)  Platelets 150 - 400 K/uL 147(L) 192 204   BMP Latest Ref Rng & Units 10/27/2016 10/26/2016 10/25/2016  Glucose 65 - 99 mg/dL 103(H) 102(H) 159(H)  BUN 6 - 20 mg/dL '13 13 17  ' Creatinine 0.61 - 1.24 mg/dL 0.38(L) 0.49(L) 0.48(L)  Sodium 135 - 145 mmol/L 142 140 139  Potassium 3.5 - 5.1 mmol/L 3.6 4.4 3.6  Chloride 101 - 111 mmol/L 114(H) 113(H) 111  CO2 22 - 32 mmol/L '23 23 23  ' Calcium 8.9 - 10.3 mg/dL 7.8(L) 7.4(L) 7.3(L)   Hepatic Function Latest Ref Rng & Units 10/27/2016 10/26/2016 10/26/2016  Total Protein 6.5 - 8.1 g/dL - - 5.8(L)  Albumin 3.5 - 5.0 g/dL 1.5(L) 1.2(L) 1.2(L)  AST 15 - 41 U/L - - 74(H)  ALT 17 - 63 U/L - - 34  Alk Phosphatase 38 - 126 U/L - - 128(H)  Total Bilirubin 0.3 - 1.2 mg/dL - - 1.0  Bilirubin, Direct 0.1 - 0.5  mg/dL - - -    IMAGING/STUDIES: CT CHEST W/O 2/26:  Previously reviewed by me. Liver cirrhosis with a small amount of ascites. Enlarged precarinal lymph nodes. Emphysema noted predominantly apical on the left. Dense consolidation on the right with pockets and what seems to be air-fluid levels. Appears to have a pleural effusion on the right as well. TTE 2/27:Poor quality imaging. LV cavity size normal. EF 55-60% with no regional wall motion abnormalities. Unable to assess diastolic function. No obvious aortic stenosis or regurgitation with poorly visualized valve. Aortic root normal in  size. No significant mitral regurgitation. Left atrium normal in size. Right ventricle and right Atrium poorly visualized. Moderate-sized pericardial effusion apically and posterior to the heart. 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  PORT CXR 2/28:  Personally reviewed by me. Right-sided chest tubes in good position. Endotracheal and enteric tube is in good position. Persistent opacification right upper lobe with improved aeration right lung base. PORT ABD X-RAY 2/28:  Normal small bowel gas pattern. Some colonic stool and gas noted without significant colonic distension. PORT CXR 3/1:  Previously reviewed by me. Right-sided chest tubes remain in good position. Continued right upper lobe opacification. Endotracheal tube and enteric feeding tube in good position. Central venous catheter in good position. PERITONEAL FLUID 3/1:  WBC 28 (6% lymph, 56% neutrophils, 38% monocytes)  PORT CXR 3/2:  Personally reviewed by me. Silhouetting left hemidiaphragm new compared with previous suggestive of developing pleural effusion. Endotracheal tube, enteric feeding tube, and central venous catheter in good position. Right-sided chest tubes in good position. Persistent opacification right upper lobe.  MICROBIOLOGY: HIV 1/2 2/23:  Nonreactive Urine Culture 2/23:  <10,000 insignificant growth Blood Culture x1 2/24 >>> MRSA PCR 2/26:  Negative  Blood Cultures x2 2/25 >>> Tracheal Aspirate Culture 2/26:  Normal flora  Respiratory Panel PCR 2/26:  Negative  Influenza A/B PCR 2/26:  Negative  Urine Streptococcal Antigen 2/26:  Negative  Urine Legionella Antigen 2/26:  Negative  Bronchial Wash Right 2/27 >>> Right Pleural Fluid Culture 2/27 >>> Peritoneal Fluid Culture 3/1  >>>  ANTIBIOTICS: Cefepime 2/26 (1 dose) Vancomycin 2/26 (1 dose) Tamiflu 2/26 (stopped) Azithromycin 2/26 - 3/1 Rocephin 2/26 >>>  SIGNIFICANT EVENTS: 02/26 - Admit 02/27 - VATS w/ decortication & bronchoscopy by Dr. Servando Snare 02/28 - Tube feeds held with increased abdominal distension 03/01 - Paracentesis performed at bedside  ASSESSMENT/PLAN:  56 y.o.  male with known history of liver cirrhosis and polysubstance abuse. Admitted with severe community-acquired pneumonia/postobstructive pneumonia newly diagnosed with non-small cell lung cancer the right upper lobe.   1. Severe to me quite pneumonia/postobstructive pneumonia: Continuing Rocephin day #5. Awaiting finalization of cultures. 2. Severe sepsis with septic shock: Shock has resolved. Secondary to pneumonia. Awaiting finalization of cultures. Continuing Rocephin. Continuing to trend leukocytosis which is improving. 3. Acute hypoxic respiratory failure: Multifactorial from pneumonia as well as endobronchial obstruction with lung cancer. Continuing full ventilator support. Continuing Duonebs every 6 hours. 4. Polysubstance abuse/alcohol withdrawal: Continuing Versed drip. Weaning propofol drip. Continuing thiamine IV daily, folic acid IV daily, & nicotine patch. 5. Ascites: Culture pending. Status post paracentesis on 3/1. Likely secondary to liver cirrhosis. 6. Elevated troponin I: Likely demand ischemia. Poor quality echocardiogram. Holding off on systemic anticoagulation. 7. Pericardial effusion: No obvious physiology to suggest  temporal nod. Poor quality echocardiogram. Continuing to monitor vitals per unit protocol. 8. Right-sided complicated parapneumonic effusion/hydropneumothorax: Status post thoracoscopic surgery and chest tube placement. Cultures pending. Chest tube management per thoracic surgery. 9. Liver cirrhosis with ascites: Status post paracentesis yesterday. Known history of chronic alcohol use. 10. Right upper lobe  non-small cell lung cancer: New diagnosis post endobronchial biopsy. No evidence of pleural involvement. Patient will need further staging with PET/CT imaging if he survives this acute illness. 11. Moderate malnutrition: Restarting trickle tube feedings.   Prophylaxis:  Pepcid IV q12hr, SCDs, & Lovenox [place] daily.  Diet:  NPO. Restarting trickle tube feedings today.  Code Status: Full code per previous physician discussions. Disposition: Remains critically ill and endotracheally intubated in the intensive care unit. Family Update:  Still no family at bedside during rounds today.  I have spent a total of 32 minutes of critical care time today caring for the patient and reviewing the patient's electronic medical record.  Sonia Baller Ashok Cordia, M.D. [facility] Pager:  [phone] After 3pm or if no response, call [phone] 11:15 AM 10/27/16

## 2016-10-27 NOTE — Progress Notes (Signed)
Called to update Dr. Ashok Cordia about weaning propofol and versed, was able to get propofol off from 30 mcgs and versed to 5 however patients RR were 30s-40, was unable to get patient to arouse or move extremities to painful stimuli, patient was having tremors in upper bilateral extremities, pupils equal and reactive, Dr. Ashok Cordia gave verbal orders to start precedex and get propofol off.  Saige Busby M, RN   Versed currently back up to 7 mgs and propofol at 30 mcgs.

## 2016-10-28 DIAGNOSIS — G9340 Encephalopathy, unspecified: Secondary | ICD-10-CM

## 2016-10-28 DIAGNOSIS — J9601 Acute respiratory failure with hypoxia: Secondary | ICD-10-CM

## 2016-10-28 LAB — CBC WITH DIFFERENTIAL/PLATELET
BASOS ABS: 0 10*3/uL (ref 0.0–0.1)
BASOS PCT: 0 %
EOS ABS: 0.2 10*3/uL (ref 0.0–0.7)
EOS PCT: 1 %
HCT: 32.5 % — ABNORMAL LOW (ref 39.0–52.0)
Hemoglobin: 10.5 g/dL — ABNORMAL LOW (ref 13.0–17.0)
Lymphocytes Relative: 8 %
Lymphs Abs: 1.6 10*3/uL (ref 0.7–4.0)
MCH: 29.7 pg (ref 26.0–34.0)
MCHC: 32.3 g/dL (ref 30.0–36.0)
MCV: 91.8 fL (ref 78.0–100.0)
MONO ABS: 1.4 10*3/uL — AB (ref 0.1–1.0)
Monocytes Relative: 7 %
Neutro Abs: 17.4 10*3/uL — ABNORMAL HIGH (ref 1.7–7.7)
Neutrophils Relative %: 84 %
PLATELETS: 149 10*3/uL — AB (ref 150–400)
RBC: 3.54 MIL/uL — ABNORMAL LOW (ref 4.22–5.81)
RDW: 16.9 % — AB (ref 11.5–15.5)
WBC: 20.7 10*3/uL — AB (ref 4.0–10.5)

## 2016-10-28 LAB — RENAL FUNCTION PANEL
ALBUMIN: 1.3 g/dL — AB (ref 3.5–5.0)
Anion gap: 5 (ref 5–15)
BUN: 14 mg/dL (ref 6–20)
CO2: 22 mmol/L (ref 22–32)
CREATININE: 0.43 mg/dL — AB (ref 0.61–1.24)
Calcium: 7.8 mg/dL — ABNORMAL LOW (ref 8.9–10.3)
Chloride: 114 mmol/L — ABNORMAL HIGH (ref 101–111)
GFR calc Af Amer: >60 mL/min (ref >60)
GFR calc non Af Amer: >60 mL/min (ref >60)
GLUCOSE: 117 mg/dL — AB (ref 65–99)
PHOSPHORUS: 3.4 mg/dL (ref 2.5–4.6)
POTASSIUM: 3.8 mmol/L (ref 3.5–5.1)
SODIUM: 141 mmol/L (ref 135–145)

## 2016-10-28 LAB — GLUCOSE, CAPILLARY
GLUCOSE-CAPILLARY: 109 mg/dL — AB (ref 65–99)
GLUCOSE-CAPILLARY: 103 mg/dL — AB (ref 65–99)
GLUCOSE-CAPILLARY: 121 mg/dL — AB (ref 65–99)
Glucose-Capillary: 120 mg/dL — ABNORMAL HIGH (ref 65–99)
Glucose-Capillary: 103 mg/dL — ABNORMAL HIGH (ref 65–99)

## 2016-10-28 LAB — MAGNESIUM: MAGNESIUM: 2.2 mg/dL (ref 1.7–2.4)

## 2016-10-28 MED ORDER — LACTULOSE 10 GM/15ML PO SOLN
30.0000 g | Freq: Two times a day (BID) | ORAL | Status: DC
  Administered 2016-10-28 – 2016-11-02 (×11): 30 g
  Filled 2016-10-28 (×11): qty 45

## 2016-10-28 NOTE — Progress Notes (Signed)
Versed 15m flushed down sink. Witnessed by JSoyla Dryer RN.

## 2016-10-28 NOTE — Progress Notes (Signed)
[facility] Subjective:   Unresponsive on vent   Objective   Temp:  [94.3 F (34.6 C)-101.5 F (38.6 C)] 101.5 F (38.6 C) (03/03 1145) Pulse Rate:  [51-94] 81 (03/03 1400) Resp:  [19-37] 36 (03/03 1400) BP: (80-118)/(51-80) 103/63 (03/03 1400) SpO2:  [91 %-99 %] 93 % (03/03 1400) Arterial Line BP: (83-118)/(45-63) 118/58 (03/03 1400) FiO2 (%):  [40 %] 40 % (03/03 0817) Weight:  [146 lb 6.2 oz (66.4 kg)] 146 lb 6.2 oz (66.4 kg) (03/03 0234) . dexmedetomidine 0.499 mcg/kg/hr (10/28/16 1400)  . dextrose 5 % and 0.9 % NaCl with KCl 20 mEq/L 100 mL/hr at 10/28/16 1400  . lactated ringers Stopped (10/26/16 1900)  . midazolam (VERSED) infusion 1 mg/hr (10/28/16 1400)  . phenylephrine (NEO-SYNEPHRINE) Adult infusion 45.067 mcg/min (10/28/16 1400)    Intake/Output Summary (Last 24 hours) at 10/28/16 1410 Last data filed at 10/28/16 1400  Gross per 24 hour  Intake          3341.67 ml  Output              940 ml  Net          2401.67 ml    General appearance:  56 Year old  Male, cachectic  NAD, sedated and not responsive, temporal wasting  Eyes: anicteric sclerae , moist conjunctivae; PERRL, EOMI bilaterally. Mouth:  membranes and no mucosal ulcerations; normal hard and soft palate, orally intubated.  Neck: Trachea midline; neck supple, no JVD Lungs/chest: diffuse rhonchi, rapid resp rate in 30s. + tactile frem. Right chest tube w/ 5/5 airleak CV: RRR, no MRGs  Abdomen: Soft, slightly distended, non-tender; no masses or HSM Extremities: pitting LE edema  Skin: Normal temperature, turgor and texture; no rash Neuro/Psych: sedated. GCS 4.  CBC Recent Labs     10/26/16  0500  10/27/16  0335  10/28/16  0419  WBC  21.8*  20.9*  20.7*  HGB  11.0*  10.2*  10.5*  HCT  33.2*  31.2*  32.5*  PLT  192  147*  149*    Coag's No results for input(s): APTT, INR in the last 72 hours.  BMET Recent Labs     10/26/16  0500  10/27/16  0335  10/28/16  0420  NA  140   142  141  K  4.4  3.6  3.8  CL  113*  114*  114*  CO2  '23  23  22  '$ BUN  '13  13  14  '$ CREATININE  0.49*  0.38*  0.43*  GLUCOSE  102*  103*  117*    Electrolytes Recent Labs     10/26/16  0500  10/27/16  0335  10/28/16  0419  10/28/16  0420  CALCIUM  7.4*  7.8*   --   7.8*  MG  2.1  2.2  2.2   --   PHOS  3.6  3.7   --   3.4    Sepsis Markers No results for input(s): PROCALCITON, O2SATVEN in the last 72 hours.  Invalid input(s): LACTICACIDVEN  ABG No results for input(s): PHART, PCO2ART, PO2ART in the last 72 hours.  Liver Enzymes Recent Labs     10/26/16  0500  10/27/16  0335  10/28/16  0420  AST  74*   --    --   ALT  34   --    --   ALKPHOS  128*   --    --   BILITOT  1.0   --    --   ALBUMIN  1.2*  1.2*  1.5*  1.3*    Cardiac Enzymes No results for input(s): TROPONINI, PROBNP in the last 72 hours.  Glucose Recent Labs     10/27/16  1522  10/27/16  2001  10/27/16  2319  10/28/16  0354  10/28/16  0814  10/28/16  1143  GLUCAP  117*  111*  107*  120*  109*  103*    Imaging Dg Chest Port 1 View  Result Date: 10/27/2016 CLINICAL DATA:  Intubation. EXAM: PORTABLE CHEST 1 VIEW COMPARISON:  10/26/2016. FINDINGS: Endotracheal tube, NG tube, mediastinal drainage catheter, right IJ line, right chest tube in stable position. No pneumothorax. Stable cardiomegaly. Persistent right upper lobe atelectasis with slight improvement. New onset left lower lobe atelectasis. Bibasilar infiltrates noted on today's exam. New small left pleural effusion. Stable small right pleural effusion. IMPRESSION: 1. Lines and tubes including right chest tube in stable position. No pneumothorax. 2. Persistent right upper lobe atelectasis with slight improvement. New onset left lower lobe atelectasis. Bibasilar infiltrates noted on today's exam. New small left pleural effusion. Stable right pleural effusion . Electronically Signed   By: Marcello Moores  Register   On: 10/27/2016 07:54     LINES/TUBES: OETT 7.5 2/26 >>> Foley 2/26 >>> OGT 2/26 >>> Right Chest Tube x2 2/27 >>> R IJ CVL 2/26 >>> R Radial Art Line 2/27 >>> PIV    IMAGING/STUDIES: CT CHEST W/O 2/26:  Previously reviewed by me. Liver cirrhosis with a small amount of ascites. Enlarged precarinal lymph nodes. Emphysema noted predominantly apical on the left. Dense consolidation on the right with pockets and what seems to be air-fluid levels. Appears to have a pleural effusion on the right as well. TTE 2/27:Poor quality imaging. LV cavity size normal. EF 55-60% with no regional wall motion abnormalities. Unable to assess diastolic function. No obvious aortic stenosis or regurgitation with poorly visualized valve. Aortic root normal in size. No significant mitral regurgitation. Left atrium normal in size. Right ventricle and right Atrium poorly visualized. Moderate-sized pericardial effusion apically and posterior to the heart. 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  PORT CXR 2/28:  Personally reviewed by me. Right-sided chest tubes in good position. Endotracheal and enteric tube is in good position. Persistent opacification right upper lobe with improved aeration right lung base. PORT ABD X-RAY 2/28:  Normal small bowel gas pattern. Some colonic stool and gas noted without significant colonic distension. PORT CXR 3/1:  Previously reviewed by me. Right-sided chest tubes remain in good position. Continued right upper lobe opacification. Endotracheal tube and enteric feeding tube in good position. Central venous catheter in good position. PERITONEAL FLUID 3/1:  WBC 28 (6% lymph, 56% neutrophils, 38% monocytes)  PORT CXR 3/2:  Personally reviewed by me. Silhouetting left hemidiaphragm new compared with  previous suggestive of developing pleural effusion. Endotracheal tube, enteric feeding tube, and central venous catheter in good position. Right-sided chest tubes in good position. Persistent opacification right upper lobe.  MICROBIOLOGY: HIV 1/2 2/23:  Nonreactive Urine Culture 2/23:  <10,000 insignificant growth Blood Culture x1 2/24 >>> MRSA PCR 2/26:  Negative  Blood Cultures x2 2/25 >>> Tracheal Aspirate Culture 2/26:  Normal flora  Respiratory Panel PCR 2/26:  Negative  Influenza A/B PCR 2/26:  Negative  Urine Streptococcal Antigen 2/26:  Negative  Urine Legionella Antigen 2/26:  Negative  Bronchial Wash Right 2/27 >>> Right Pleural Fluid Culture 2/27 >>> Peritoneal Fluid Culture 3/1 >>>  ANTIBIOTICS: Cefepime 2/26 (1 dose) Vancomycin 2/26 (1 dose) Tamiflu 2/26 (stopped) Azithromycin 2/26 - 3/1 Rocephin 2/26 >>>  SIGNIFICANT EVENTS: 02/26 - Admit 02/27 - VATS w/ decortication & bronchoscopy by Dr. Servando Snare 02/28 - Tube feeds held with increased abdominal distension 03/01 - Paracentesis performed at bedside  ASSESSMENT/PLAN: 2:08 PM 10/28/16   Pulmonary/ID Acute hypoxic respiratory Failure in the setting of severe CAP vs post-obstructive PNA c/b right sided complicated parapneumonic effusion/empeyma.  S/p VATS on 2/28.  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  Chest tube management per surgical team May require trach   Neuro Acute encephalopathy in setting of polysubstance abuse and ETOH abuse.  ->this is a large component as to why he can't come off vent  Plan Cont thiamine, folic acid and nicotine patch Cont precedex gtt. 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 No evidence of pleural involvement Anemia of critical illness Thrombocytopenia in setting of cirrhosis  Plan Further staging and heme/onc eval if survives acute illness.  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 He remains fully dependent of vent primarily d/t persistent encephalopathy and ETOH/polysubstance withdrawal. For today primary plan is to dc versed gtt (as he has h/o cirrhosis), cont precedex and add fent for pain. Hope he can wake up some soon and start weaning. I am concerned given his poor baseline health that he may need trach. I also fear it will be some time before we can even begin to address the newly diagnosed cancer.   My critical care time  32 minutes  Erick Colace ACNP-BC [place] Pager # [phone] OR # [phone] if no answer

## 2016-10-28 NOTE — Progress Notes (Signed)
4 Days Post-Op Procedure(s) (LRB): VIDEO BRONCHOSCOPY (N/A) VIDEO ASSISTED THORACOSCOPY (VATS)/DECORTICATION AND EMPYEMA (Right) Subjective: mode4rate air leak 2 chest tubes in place Total drainage 120 cc  Objective: Vital signs in last 24 hours: Temp:  [94.3 F (34.6 C)-100.1 F (37.8 C)] 100.1 F (37.8 C) (03/03 0817) Pulse Rate:  [51-94] 94 (03/03 0900) Cardiac Rhythm: Normal sinus rhythm (03/03 0800) Resp:  [19-37] 37 (03/03 0900) BP: (80-118)/(51-80) 87/53 (03/03 0900) SpO2:  [91 %-99 %] 91 % (03/03 0900) Arterial Line BP: (83-117)/(45-63) 101/49 (03/03 0900) FiO2 (%):  [40 %] 40 % (03/03 0817) Weight:  [146 lb 6.2 oz (66.4 kg)] 146 lb 6.2 oz (66.4 kg) (03/03 0234)  Hemodynamic parameters for last 24 hours:  stable  Intake/Output from previous day: 03/02 0701 - 03/03 0700 In: 3516.5 [I.V.:2934; NG/GT:482.5; IV Piggyback:100] Out: 1005 [Urine:885; Chest Tube:120] Intake/Output this shift: Total I/O In: 395.9 [I.V.:345.9; NG/GT:50] Out: 60 [Urine:60]  Resting on vent  Lab Results:  Recent Labs  10/27/16 0335 10/28/16 0419  WBC 20.9* 20.7*  HGB 10.2* 10.5*  HCT 31.2* 32.5*  PLT 147* 149*   BMET:  Recent Labs  10/27/16 0335 10/28/16 0420  NA 142 141  K 3.6 3.8  CL 114* 114*  CO2 23 22  GLUCOSE 103* 117*  BUN 13 14  CREATININE 0.38* 0.43*  CALCIUM 7.8* 7.8*    PT/INR: No results for input(s): LABPROT, INR in the last 72 hours. ABG    Component Value Date/Time   PHART 7.445 10/25/2016 0425   HCO3 21.7 10/25/2016 0425   TCO2 23 10/25/2016 0425   ACIDBASEDEF 2.0 10/25/2016 0425   O2SAT 97.0 10/25/2016 0425   CBG (last 3)   Recent Labs  10/27/16 2319 10/28/16 0354 10/28/16 0814  GLUCAP 107* 120* 109*    Assessment/Plan: S/P Procedure(s) (LRB): VIDEO BRONCHOSCOPY (N/A) VIDEO ASSISTED THORACOSCOPY (VATS)/DECORTICATION AND EMPYEMA (Right) No growth from OR cultures Leave both tubes  LOS: 5 days    Tharon Aquas Trigt III 10/28/2016

## 2016-10-28 NOTE — Clinical Social Work Note (Signed)
CSW acknowledges SNF consult. Patient is not medically appropriate to begin SNF referral process at this time. CSW will continue to follow progress.  Dayton Scrape, [place]

## 2016-10-29 ENCOUNTER — Inpatient Hospital Stay (HOSPITAL_COMMUNITY): Payer: Medicaid Other

## 2016-10-29 LAB — RENAL FUNCTION PANEL
Albumin: 1.2 g/dL — ABNORMAL LOW (ref 3.5–5.0)
Anion gap: 4 — ABNORMAL LOW (ref 5–15)
BUN: 14 mg/dL (ref 6–20)
CHLORIDE: 115 mmol/L — AB (ref 101–111)
CO2: 23 mmol/L (ref 22–32)
Calcium: 7.7 mg/dL — ABNORMAL LOW (ref 8.9–10.3)
Creatinine, Ser: 0.47 mg/dL — ABNORMAL LOW (ref 0.61–1.24)
Glucose, Bld: 122 mg/dL — ABNORMAL HIGH (ref 65–99)
POTASSIUM: 3.6 mmol/L (ref 3.5–5.1)
Phosphorus: 3.7 mg/dL (ref 2.5–4.6)
Sodium: 142 mmol/L (ref 135–145)

## 2016-10-29 LAB — CBC WITH DIFFERENTIAL/PLATELET
BASOS PCT: 0 %
Basophils Absolute: 0 10*3/uL (ref 0.0–0.1)
EOS ABS: 0.2 10*3/uL (ref 0.0–0.7)
EOS PCT: 1 %
HEMATOCRIT: 32.3 % — AB (ref 39.0–52.0)
Hemoglobin: 10.5 g/dL — ABNORMAL LOW (ref 13.0–17.0)
LYMPHS PCT: 12 %
Lymphs Abs: 2.3 10*3/uL (ref 0.7–4.0)
MCH: 29.9 pg (ref 26.0–34.0)
MCHC: 32.5 g/dL (ref 30.0–36.0)
MCV: 92 fL (ref 78.0–100.0)
Monocytes Absolute: 1.5 10*3/uL — ABNORMAL HIGH (ref 0.1–1.0)
Monocytes Relative: 8 %
NEUTROS PCT: 79 %
Neutro Abs: 15.1 10*3/uL — ABNORMAL HIGH (ref 1.7–7.7)
PLATELETS: 124 10*3/uL — AB (ref 150–400)
RBC: 3.51 MIL/uL — ABNORMAL LOW (ref 4.22–5.81)
RDW: 17.3 % — ABNORMAL HIGH (ref 11.5–15.5)
WBC: 19.1 10*3/uL — ABNORMAL HIGH (ref 4.0–10.5)

## 2016-10-29 LAB — GLUCOSE, CAPILLARY
GLUCOSE-CAPILLARY: 104 mg/dL — AB (ref 65–99)
GLUCOSE-CAPILLARY: 107 mg/dL — AB (ref 65–99)
GLUCOSE-CAPILLARY: 110 mg/dL — AB (ref 65–99)
GLUCOSE-CAPILLARY: 139 mg/dL — AB (ref 65–99)
Glucose-Capillary: 112 mg/dL — ABNORMAL HIGH (ref 65–99)
Glucose-Capillary: 119 mg/dL — ABNORMAL HIGH (ref 65–99)

## 2016-10-29 LAB — BODY FLUID CULTURE: Culture: NO GROWTH

## 2016-10-29 LAB — ANAEROBIC CULTURE

## 2016-10-29 LAB — MAGNESIUM: MAGNESIUM: 2 mg/dL (ref 1.7–2.4)

## 2016-10-29 LAB — TRIGLYCERIDES: Triglycerides: 40 mg/dL (ref <150)

## 2016-10-29 MED ORDER — FENTANYL 2500MCG IN NS 250ML (10MCG/ML) PREMIX INFUSION: 25.0000 ug/h | INTRAVENOUS | Status: DC

## 2016-10-29 MED ORDER — FENTANYL 2500MCG IN NS 250ML (10MCG/ML) PREMIX INFUSION
0.0000 ug/h | INTRAVENOUS | Status: DC
Start: 2016-10-29 — End: 2016-11-03
  Administered 2016-10-29: 25 ug/h via INTRAVENOUS
  Administered 2016-10-30: 50 ug/h via INTRAVENOUS
  Administered 2016-11-01: 75 ug/h via INTRAVENOUS
  Administered 2016-11-01: 125 ug/h via INTRAVENOUS
  Administered 2016-11-03: 100 ug/h via INTRAVENOUS
  Filled 2016-10-29 (×5): qty 250

## 2016-10-29 NOTE — Progress Notes (Signed)
5 Days Post-Op Procedure(s) (LRB): VIDEO BRONCHOSCOPY (N/A) VIDEO ASSISTED THORACOSCOPY (VATS)/DECORTICATION AND EMPYEMA (Right) Subjective: CXR stable No air leak today Started PS weaning trials Responds appropriately  Objective: Vital signs in last 24 hours: Temp:  [97.5 F (36.4 C)-101.5 F (38.6 C)] 97.5 F (36.4 C) (03/04 0735) Pulse Rate:  [59-90] 62 (03/04 0900) Cardiac Rhythm: Normal sinus rhythm (03/04 0800) Resp:  [13-38] 20 (03/04 0900) BP: (90-127)/(57-92) 108/76 (03/04 0900) SpO2:  [92 %-98 %] 96 % (03/04 0900) Arterial Line BP: (98-131)/(52-68) 120/65 (03/04 0900) FiO2 (%):  [40 %] 40 % (03/04 0800) Weight:  [165 lb 5.5 oz (75 kg)] 165 lb 5.5 oz (75 kg) (03/04 0500)  Hemodynamic parameters for last 24 hours:    Intake/Output from previous day: 03/03 0701 - 03/04 0700 In: 4120.8 [I.V.:3505.8; NG/GT:465; IV Piggyback:150] Out: 1465 [Urine:1385; Chest Tube:80] Intake/Output this shift: Total I/O In: 464 [I.V.:304; NG/GT:110; IV Piggyback:50] Out: 150 [Urine:110; Chest Tube:40]  Tubes secure with minimal drainage DC posterior tube  Lab Results:  Recent Labs  10/28/16 0419 10/29/16 0415  WBC 20.7* 19.1*  HGB 10.5* 10.5*  HCT 32.5* 32.3*  PLT 149* 124*   BMET:  Recent Labs  10/28/16 0420 10/29/16 0415  NA 141 142  K 3.8 3.6  CL 114* 115*  CO2 22 23  GLUCOSE 117* 122*  BUN 14 14  CREATININE 0.43* 0.47*  CALCIUM 7.8* 7.7*    PT/INR: No results for input(s): LABPROT, INR in the last 72 hours. ABG    Component Value Date/Time   PHART 7.445 10/25/2016 0425   HCO3 21.7 10/25/2016 0425   TCO2 23 10/25/2016 0425   ACIDBASEDEF 2.0 10/25/2016 0425   O2SAT 97.0 10/25/2016 0425   CBG (last 3)   Recent Labs  10/29/16 0014 10/29/16 0407 10/29/16 0738  GLUCAP 107* 104* 112*    Assessment/Plan: S/P Procedure(s) (LRB): VIDEO BRONCHOSCOPY (N/A) VIDEO ASSISTED THORACOSCOPY (VATS)/DECORTICATION AND EMPYEMA (Right) Remove 1 tube today   LOS: 6 days    Tharon Aquas Trigt III 10/29/2016

## 2016-10-29 NOTE — Progress Notes (Signed)
[facility]  56 year old man with past medical history of cocaine and alcohol abuse with COPD and hepatitis C admitted to/25 with a postobstructive pneumonia and empyema, found to have non-small cell cancer and bronchoscopy blocking the right upper lobe bronchus, status post VATS on 2/27  Subjective:   Afebrile Intermittent agitation and sedation lowered, Remains critically ill, intubated and sedated  Objective   Temp:  [97.5 F (36.4 C)-100.8 F (38.2 C)] 97.5 F (36.4 C) (03/04 1234) Pulse Rate:  [54-90] 59 (03/04 1300) Resp:  [13-38] 22 (03/04 1300) BP: (90-127)/(57-92) 101/71 (03/04 1300) SpO2:  [92 %-98 %] 95 % (03/04 1300) Arterial Line BP: (98-131)/(52-68) 110/60 (03/04 1300) FiO2 (%):  [40 %] 40 % (03/04 1200) Weight:  [165 lb 5.5 oz (75 kg)] 165 lb 5.5 oz (75 kg) (03/04 0500) . dexmedetomidine 1.2 mcg/kg/hr (10/29/16 1300)  . dextrose 5 % and 0.9 % NaCl with KCl 20 mEq/L 75 mL/hr at 10/29/16 1300  . fentaNYL infusion INTRAVENOUS    . lactated ringers 50 mL/hr at 10/29/16 1300  . phenylephrine (NEO-SYNEPHRINE) Adult infusion 15 mcg/min (10/29/16 1300)    Intake/Output Summary (Last 24 hours) at 10/29/16 1404 Last data filed at 10/29/16 1300  Gross per 24 hour  Intake          4113.02 ml  Output             1570 ml  Net          2543.02 ml    General appearance:  Cachectic, intubated sedated , temporal wasting  Eyes: anicteric sclerae , moist conjunctivae; PERRL, EOMI bilaterally. Mouth:  membranes and no mucosal ulcerations; normal hard and soft palate, orally intubated.  Neck: Trachea midline; neck supple, no JVD Lungs/chest: scattered rhonchi. + tactile frem. Right chest tube w/ o  airleak CV: RRR, no MRGs  Abdomen: Soft, slightly distended, non-tender; no masses or HSM Extremities: pitting LE edema  Skin: Normal temperature, turgor and texture; no rash Neuro/Psych: sedated. GCS 4.  CBC Recent Labs     10/27/16  0335  10/28/16  0419   10/29/16  0415  WBC  20.9*  20.7*  19.1*  HGB  10.2*  10.5*  10.5*  HCT  31.2*  32.5*  32.3*  PLT  147*  149*  124*    Coag's No results for input(s): APTT, INR in the last 72 hours.  BMET Recent Labs     10/27/16  0335  10/28/16  0420  10/29/16  0415  NA  142  141  142  K  3.6  3.8  3.6  CL  114*  114*  115*  CO2  '23  22  23  '$ BUN  '13  14  14  '$ CREATININE  0.38*  0.43*  0.47*  GLUCOSE  103*  117*  122*    Electrolytes Recent Labs     10/27/16  0335  10/28/16  0419  10/28/16  0420  10/29/16  0415  CALCIUM  7.8*   --   7.8*  7.7*  MG  2.2  2.2   --   2.0  PHOS  3.7   --   3.4  3.7    Sepsis Markers No results for input(s): PROCALCITON, O2SATVEN in the last 72 hours.  Invalid input(s): LACTICACIDVEN  ABG No results for input(s): PHART, PCO2ART, PO2ART in the last 72 hours.  Liver Enzymes Recent Labs     10/27/16  0335  10/28/16  0420  10/29/16  0415  ALBUMIN  1.5*  1.3*  1.2*    Cardiac Enzymes No results for input(s): TROPONINI, PROBNP in the last 72 hours.  Glucose Recent Labs     10/28/16  1631  10/28/16  1933  10/29/16  0014  10/29/16  0407  10/29/16  0738  10/29/16  1233  GLUCAP  103*  121*  107*  104*  112*  119*    Imaging Dg Chest Port 1 View  Result Date: 10/29/2016 CLINICAL DATA:  Chest tube in place. EXAM: PORTABLE CHEST 1 VIEW COMPARISON:  10/27/2016 FINDINGS: Endotracheal tube tip is 1.8 cm above the carina. Multiple right-sided chest tubes are in place, unchanged in appearance. There is persistent opacity at the right lung apex. Small right pleural effusion. There is opacity at the left lung base which obscures the left hemidiaphragm. IMPRESSION: 1. Persistent significant consolidation or atelectasis of the left lower lobe. 2. Unchanged opacity in the right lung.  No pneumothorax. Electronically Signed   By: Nolon Nations M.D.   On: 10/29/2016 08:26    LINES/TUBES: OETT 7.5 2/26 >>> Foley 2/26 >>> OGT 2/26 >>> Right Chest  Tube x2 2/27 >>> R IJ CVL 2/26 >>> R Radial Art Line 2/27 >>> PIV    IMAGING/STUDIES: CT CHEST W/O 2/26:  Previously reviewed by me. Liver cirrhosis with a small amount of ascites. Enlarged precarinal lymph nodes. Emphysema noted predominantly apical on the left. Dense consolidation on the right with pockets and what seems to be air-fluid levels. Appears to have a pleural effusion on the right as well. TTE 2/27:Poor quality imaging. LV cavity size normal. EF 55-60% with no regional wall motion abnormalities. Unable to assess diastolic function. No obvious aortic stenosis or regurgitation with poorly visualized valve. Aortic root normal in size. No significant mitral regurgitation. Left atrium normal in size. Right ventricle and right Atrium poorly visualized. Moderate-sized pericardial effusion apically and posterior to the heart. 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  PERITONEAL FLUID 3/1:  WBC 28 (6% lymph, 56% neutrophils, 38% monocytes)   MICROBIOLOGY: HIV 1/2 2/23:  Nonreactive Urine Culture 2/23:  <10,000 insignificant growth Blood Culture x1 2/24 >>>ng MRSA PCR 2/26:  Negative  Blood Cultures x2 2/25 >>> Tracheal Aspirate Culture 2/26:  Normal flora  Respiratory Panel PCR 2/26:  Negative  Influenza A/B PCR 2/26:  Negative  Urine Streptococcal Antigen 2/26:  Negative  Urine Legionella Antigen 2/26:  Negative  Bronchial Wash Right 2/27 >>>ng Right Pleural Fluid Culture 2/27 >>>ng Peritoneal Fluid Culture 3/1 >>>ng  ANTIBIOTICS: Cefepime 2/26 (1 dose) Vancomycin 2/26 (1 dose) Tamiflu 2/26 (stopped) Azithromycin 2/26 - 3/1 Rocephin 2/26 >>>  SIGNIFICANT EVENTS: 02/26 - Admit 02/27 - VATS w/ decortication & bronchoscopy by Dr. Servando Snare 02/28 - Tube feeds held  with increased abdominal distension 03/01 - Paracentesis performed at bedside  ASSESSMENT/PLAN: 2:04 PM 10/29/16   Pulmonary/ID Acute hypoxic respiratory Failure in the setting of severe CAP vs post-obstructive PNA c/b right sided complicated parapneumonic effusion/empeyma.  S/p VATS on 2/28.  Persistent Bronchopleural fistula  Plan Ct rocephin x 14d total abx Cont scheduled BDs Chest tube management per surgical team SBTs - May need radiation to RUL to facilitate wean if remains vent dependent  Neuro Acute encephalopathy in setting of polysubstance abuse and ETOH abuse.  Plan Cont thiamine, folic acid and nicotine patch Cont precedex gtt dc versed  Fent gtt, RASS goal -1 Add lactulose  May need to consider CT head   CARDS Mod pericardial effusion w/out tamponade Hypotension- related to sedative agents Plan Cont tele Low dose neo gtt  HEME Right upper lobe NSCLCA. New dx from endobronchial Bx. 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) Severe protein calorie malnutrition  Plan Cont tubefeeds   RENAL Mild hyperchloremia  Plan Dc NS Cont LR F/u am chemistry   Summary - agitation/delirium seems to be the main hurdle at this point to successful extubation he may need radiation acutely if Right upper lobe does not open up and this is felt to impede weaning Hope he can get through this illness without a tracheostomy  The patient is critically ill with multiple organ systems failure and requires high complexity decision making for assessment and support, frequent evaluation and titration of therapies, application of advanced monitoring technologies and extensive interpretation of multiple databases. Critical Care Time devoted to patient care services described in this note independent of APP  time is 35 minutes.   Rigoberto Noel MD

## 2016-10-29 NOTE — Progress Notes (Signed)
10/29/2016 1350 Pt. With continued agitation despite Precedex gtt at max ordered dose. Pt. With intermittent combativeness as well as reaching for ETT.  Pt. Requiring ordered prn fentanyl pushes q2h to maintain RASS goal. Dr. Elsworth Soho paged and made aware. Telephone verbal order received to initiate fentanyl gtt and place bilateral soft wrist restraints to protect airway. Orders placed and enacted. Will continue to closely monitor patient.  Neasia Fleeman, Arville Lime

## 2016-10-30 ENCOUNTER — Inpatient Hospital Stay (HOSPITAL_COMMUNITY): Payer: Medicaid Other

## 2016-10-30 DIAGNOSIS — J810 Acute pulmonary edema: Secondary | ICD-10-CM

## 2016-10-30 DIAGNOSIS — K7031 Alcoholic cirrhosis of liver with ascites: Secondary | ICD-10-CM

## 2016-10-30 LAB — CBC WITH DIFFERENTIAL/PLATELET
Basophils Absolute: 0 10*3/uL (ref 0.0–0.1)
Basophils Relative: 0 %
EOS ABS: 0.3 10*3/uL (ref 0.0–0.7)
EOS PCT: 1 %
HCT: 33.8 % — ABNORMAL LOW (ref 39.0–52.0)
Hemoglobin: 10.9 g/dL — ABNORMAL LOW (ref 13.0–17.0)
LYMPHS ABS: 2 10*3/uL (ref 0.7–4.0)
Lymphocytes Relative: 8 %
MCH: 30 pg (ref 26.0–34.0)
MCHC: 32.2 g/dL (ref 30.0–36.0)
MCV: 93.1 fL (ref 78.0–100.0)
MONO ABS: 1.6 10*3/uL — AB (ref 0.1–1.0)
Monocytes Relative: 7 %
Neutro Abs: 20.6 10*3/uL — ABNORMAL HIGH (ref 1.7–7.7)
Neutrophils Relative %: 84 %
PLATELETS: 120 10*3/uL — AB (ref 150–400)
RBC: 3.63 MIL/uL — AB (ref 4.22–5.81)
RDW: 17.1 % — ABNORMAL HIGH (ref 11.5–15.5)
WBC: 24.6 10*3/uL — AB (ref 4.0–10.5)

## 2016-10-30 LAB — GLUCOSE, CAPILLARY
GLUCOSE-CAPILLARY: 119 mg/dL — AB (ref 65–99)
GLUCOSE-CAPILLARY: 124 mg/dL — AB (ref 65–99)
GLUCOSE-CAPILLARY: 99 mg/dL (ref 65–99)
GLUCOSE-CAPILLARY: 116 mg/dL — AB (ref 65–99)
Glucose-Capillary: 100 mg/dL — ABNORMAL HIGH (ref 65–99)
Glucose-Capillary: 97 mg/dL (ref 65–99)
Glucose-Capillary: 98 mg/dL (ref 65–99)

## 2016-10-30 LAB — RENAL FUNCTION PANEL
ALBUMIN: 1.1 g/dL — AB (ref 3.5–5.0)
Anion gap: 3 — ABNORMAL LOW (ref 5–15)
BUN: 17 mg/dL (ref 6–20)
CO2: 23 mmol/L (ref 22–32)
CREATININE: 0.44 mg/dL — AB (ref 0.61–1.24)
Calcium: 7.9 mg/dL — ABNORMAL LOW (ref 8.9–10.3)
Chloride: 117 mmol/L — ABNORMAL HIGH (ref 101–111)
GFR calc Af Amer: >60 mL/min (ref >60)
GFR calc non Af Amer: >60 mL/min (ref >60)
Glucose, Bld: 126 mg/dL — ABNORMAL HIGH (ref 65–99)
PHOSPHORUS: 3.6 mg/dL (ref 2.5–4.6)
Potassium: 3.6 mmol/L (ref 3.5–5.1)
Sodium: 143 mmol/L (ref 135–145)

## 2016-10-30 LAB — MAGNESIUM: MAGNESIUM: 1.8 mg/dL (ref 1.7–2.4)

## 2016-10-30 LAB — AMMONIA: Ammonia: 62 umol/L — ABNORMAL HIGH (ref 9–35)

## 2016-10-30 MED ORDER — LORAZEPAM 2 MG/ML IJ SOLN
2.0000 mg | Freq: Once | INTRAMUSCULAR | Status: AC
  Administered 2016-10-30: 2 mg via INTRAVENOUS

## 2016-10-30 MED ORDER — ALBUMIN HUMAN 25 % IV SOLN
25.0000 g | Freq: Two times a day (BID) | INTRAVENOUS | Status: AC
  Administered 2016-10-30 (×2): 25 g via INTRAVENOUS
  Filled 2016-10-30 (×3): qty 50

## 2016-10-30 MED ORDER — CLONAZEPAM 0.5 MG PO TBDP
0.5000 mg | ORAL_TABLET | Freq: Two times a day (BID) | ORAL | Status: DC
  Administered 2016-10-30 – 2016-11-01 (×5): 0.5 mg via ORAL
  Filled 2016-10-30 (×5): qty 1

## 2016-10-30 MED ORDER — MIDAZOLAM HCL 2 MG/2ML IJ SOLN
2.0000 mg | INTRAMUSCULAR | Status: DC | PRN
  Administered 2016-10-31: 2 mg via INTRAVENOUS

## 2016-10-30 MED ORDER — MIDAZOLAM HCL 2 MG/2ML IJ SOLN
2.0000 mg | INTRAMUSCULAR | Status: DC | PRN
  Administered 2016-11-01 (×2): 2 mg via INTRAVENOUS
  Filled 2016-10-30 (×4): qty 2

## 2016-10-30 MED ORDER — MIDAZOLAM HCL 2 MG/2ML IJ SOLN
INTRAMUSCULAR | Status: AC
  Administered 2016-10-30: 2 mg
  Filled 2016-10-30: qty 2

## 2016-10-30 MED ORDER — SODIUM CHLORIDE 0.9 % IV SOLN
0.0000 ug/min | INTRAVENOUS | Status: DC
  Administered 2016-10-30: 30 ug/min via INTRAVENOUS
  Administered 2016-10-30: 45 ug/min via INTRAVENOUS
  Administered 2016-10-31 (×2): 40 ug/min via INTRAVENOUS
  Administered 2016-11-01: 35 ug/min via INTRAVENOUS
  Administered 2016-11-01: 95 ug/min via INTRAVENOUS
  Administered 2016-11-02: 60 ug/min via INTRAVENOUS
  Filled 2016-10-30 (×6): qty 4

## 2016-10-30 MED ORDER — LORAZEPAM 2 MG/ML IJ SOLN
INTRAMUSCULAR | Status: AC
  Administered 2016-10-30: 2 mg via INTRAVENOUS
  Filled 2016-10-30: qty 1

## 2016-10-30 MED ORDER — FUROSEMIDE 10 MG/ML IJ SOLN
20.0000 mg | Freq: Two times a day (BID) | INTRAMUSCULAR | Status: DC
  Administered 2016-10-30 – 2016-10-31 (×4): 20 mg via INTRAVENOUS
  Filled 2016-10-30 (×4): qty 2

## 2016-10-30 NOTE — Progress Notes (Signed)
[facility] Progress Note Patient Name: Mario Andrews DOB: 1961/02/20 MRN: [id number]   Date of Service  10/30/2016  HPI/Events of Note  Agitation remains despite fent  eICU Interventions  Add versed int , etoh history     Intervention Category Minor Interventions: Agitation / anxiety - evaluation and management  Raylene Miyamoto. 10/30/2016, 4:33 PM

## 2016-10-30 NOTE — Progress Notes (Signed)
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 Remains agitated.  Failing SBT.    Objective   Temp:  [97 F (36.1 C)-98.2 F (36.8 C)] 97 F (36.1 C) (03/05 0841) Pulse Rate:  [54-104] 104 (03/05 0800) Resp:  [13-25] 19 (03/05 0800) BP: (78-139)/(59-85) 139/85 (03/05 0800) SpO2:  [90 %-98 %] 98 % (03/05 0800) Arterial Line BP: (92-135)/(49-73) 135/73 (03/05 0800) FiO2 (%):  [40 %] 40 % (03/05 0800) Weight:  [75.5 kg (166 lb 7.2 oz)] 75.5 kg (166 lb 7.2 oz) (03/05 0500) . dexmedetomidine 0.7 mcg/kg/hr (10/30/16 0830)  . dextrose 5 % and 0.9 % NaCl with KCl 20 mEq/L 75 mL/hr at 10/30/16 0400  . fentaNYL infusion INTRAVENOUS 25 mcg/hr (10/30/16 0830)  . lactated ringers 50 mL/hr at 10/30/16 0400  . phenylephrine (NEO-SYNEPHRINE) Adult infusion 30 mcg/min (10/30/16 0400)    Intake/Output Summary (Last 24 hours) at 10/30/16 0943 Last data filed at 10/30/16 0700  Gross per 24 hour  Intake           3908.6 ml  Output              760 ml  Net           3148.6 ml    General appearance:  Cachectic, intubated sedated , temporal wasting  Eyes: anicteric sclerae , moist conjunctivae; PERRL, EOMI bilaterally. Mouth:  membranes and no mucosal ulcerations; normal hard and soft palate, orally intubated.  Neck: Trachea midline; neck supple, no JVD Lungs/chest: good ae. Crackles at bases. tachypneic on the vent.  CV: RRR, no MRGs  Abdomen: Soft, very distended, non-tender; no masses or HSM Extremities: pitting LE edema un to the thighs Skin: Normal temperature, turgor and texture; no rash Neuro/Psych: sedated. GCS 4.   CBC Recent Labs     10/28/16  0419  10/29/16  0415  10/30/16  0415  WBC  20.7*  19.1*  24.6*  HGB  10.5*  10.5*  10.9*  HCT  32.5*  32.3*  33.8*  PLT  149*  124*  120*    Coag's No results for input(s): APTT, INR in the last 72 hours.  BMET Recent Labs     10/28/16  0420  10/29/16  0415  10/30/16  0415  NA  141  142  143  K  3.8  3.6  3.6  CL  114*  115*  117*  CO2  '22  23  23  '$ BUN  '14  14  17  '$ CREATININE  0.43*  0.47*  0.44*  GLUCOSE  117*  122*  126*    Electrolytes Recent Labs     10/28/16  0419  10/28/16  0420  10/29/16  0415  10/30/16  0415  CALCIUM   --   7.8*  7.7*  7.9*  MG  2.2   --   2.0  1.8  PHOS   --   3.4  3.7  3.6    Sepsis Markers No results for input(s): PROCALCITON, O2SATVEN in the last 72 hours.  Invalid input(s): LACTICACIDVEN  ABG No results for input(s): PHART, PCO2ART, PO2ART in the last 72  hours.  Liver Enzymes Recent Labs     10/28/16  0420  10/29/16  0415  10/30/16  0415  ALBUMIN  1.3*  1.2*  1.1*    Cardiac Enzymes No results for input(s): TROPONINI, PROBNP in the last 72 hours.  Glucose Recent Labs     10/29/16  1233  10/29/16  1603  10/29/16  2023  10/30/16  0010  10/30/16  0409  10/30/16  0835  GLUCAP  119*  139*  110*  100*  119*  124*    Imaging Dg Chest Port 1 View  Result Date: 10/30/2016 CLINICAL DATA:  Right empyema with right upper lobe lung cancer. EXAM: PORTABLE CHEST 1 VIEW COMPARISON:  10/29/2016 and 10/27/2016 FINDINGS: Endotracheal tube tip is 16 mm above the carina and could be retracted slightly. Central line and NG tube appear in good position. Two right-sided chest tubes remain in place. No pneumothorax. Persistent consolidation and cavitation in the right upper lobe. Persistent atelectasis and slight consolidation in the left lower lobe is unchanged. Heart size and vascularity are normal. IMPRESSION: 1. Endotracheal tube is 15 mm above the carina, the could be slightly retracted. 2. The  persistent consolidation and cavitation in the right upper lobe, stable. 3. Persistent atelectasis/consolidation in the left lower lobe. Electronically Signed   By: Lorriane Shire M.D.   On: 10/30/2016 07:56   Dg Chest Port 1 View  Result Date: 10/29/2016 CLINICAL DATA:  Chest tube in place. EXAM: PORTABLE CHEST 1 VIEW COMPARISON:  10/27/2016 FINDINGS: Endotracheal tube tip is 1.8 cm above the carina. Multiple right-sided chest tubes are in place, unchanged in appearance. There is persistent opacity at the right lung apex. Small right pleural effusion. There is opacity at the left lung base which obscures the left hemidiaphragm. IMPRESSION: 1. Persistent significant consolidation or atelectasis of the left lower lobe. 2. Unchanged opacity in the right lung.  No pneumothorax. Electronically Signed   By: Nolon Nations M.D.   On: 10/29/2016 08:26    LINES/TUBES: OETT 7.5 2/26 >>> Foley 2/26 >>> OGT 2/26 >>> Right Chest Tube x2 2/27 >>> R IJ CVL 2/26 >>> R Radial Art Line 2/27 >>> PIV    IMAGING/STUDIES: CT CHEST W/O 2/26:  Previously reviewed by me. Liver cirrhosis with a small amount of ascites. Enlarged precarinal lymph nodes. Emphysema noted predominantly apical on the left. Dense consolidation on the right with pockets and what seems to be air-fluid levels. Appears to have a pleural effusion on the right as well. TTE 2/27:Poor quality imaging. LV cavity size normal. EF 55-60% with no regional wall motion abnormalities. Unable to assess diastolic function. No obvious aortic stenosis or regurgitation with poorly visualized valve. Aortic root normal in size. No significant mitral regurgitation. Left atrium normal in size. Right ventricle and right Atrium poorly visualized. Moderate-sized pericardial effusion apically and posterior to the heart. 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  PERITONEAL FLUID 3/1:  WBC 28 (6% lymph, 56% neutrophils, 38% monocytes)   MICROBIOLOGY: HIV 1/2 2/23:  Nonreactive Urine Culture 2/23:  <10,000 insignificant growth Blood Culture x1 2/24 >>>ng MRSA PCR 2/26:  Negative  Blood Cultures x2 2/25 >>> Tracheal Aspirate Culture 2/26:  Normal flora  Respiratory Panel PCR 2/26:  Negative  Influenza A/B PCR 2/26:  Negative  Urine  Streptococcal Antigen 2/26:  Negative  Urine Legionella Antigen 2/26:  Negative  Bronchial Wash Right 2/27 >>>ng Right Pleural Fluid Culture 2/27 >>>ng Peritoneal Fluid Culture 3/1 >>>ng  ANTIBIOTICS: Cefepime 2/26 (1 dose) Vancomycin 2/26 (1 dose) Tamiflu 2/26 (stopped) Azithromycin 2/26 - 3/1 Rocephin 2/26 >>>  SIGNIFICANT EVENTS: 02/26 - Admit 02/27 - VATS w/ decortication & bronchoscopy by Dr. Servando Snare 02/28 - Tube feeds held with increased abdominal distension 03/01 - Paracentesis performed at bedside  ASSESSMENT/PLAN:  Pulmonary/ID Acute hypoxic respiratory Failure in the setting of severe CAP and post-obstructive PNA c/b right sided complicated parapneumonic effusion/empeyma.  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  Persistent Bronchopleural fistula  Plan Ct rocephin x 14d total abx Cont scheduled BDs Chest tube management per surgical team SBTs - May need radiation to RUL to facilitate wean if remains vent dependent. He looks tachypneic on full support. He is very deconditioned and malnourished.  Need to speak to family re: GOC.  May need a trache sooner rather than later. Agitation is barrier to weaning as well.  We turned off precedex and he was very agitated, non purposeful, combative.    Neuro Acute encephalopathy in setting of polysubstance abuse and ETOH abuse.   Plan Cont thiamine, folic acid and nicotine patch Cont precedex gtt and fentanyl drip and  pushes Will add clonazepam to facilitate weaning Cont lactulose  May need to consider CT head if no improvement in mental status next 24 hrs.     CARDS Mod pericardial effusion w/out tamponade Hypotension- related to sedative agents Plan Cont tele Low dose neo gtt  HEME Right upper lobe NSCLCA. New dx from endobronchial Bx. No evidence of pleural involvement Anemia of critical illness Thrombocytopenia in setting of cirrhosis  Plan PET scan eventually F/u cbc Cont LMWH May need radiation if with compromise of airway vs stent. Does not look like airway  is narrowed now though based on CXR.    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   I spent  30  minutes of Critical Care time with this patient today.   Monica Becton, MD 10/30/2016, 10:06 AM [facility] Pager [phone] After 3 pm or if no answer, call [phone]

## 2016-10-30 NOTE — Progress Notes (Signed)
Patient ID: ARKIN IMRAN, male   DOB: 08/16/1961, 56 y.o.   MRN: [id number] [facility] DAILY ICU PROGRESS NOTE                   [address]            [place] [address]          [phone]   6 Days Post-Op Procedure(s) (LRB): VIDEO BRONCHOSCOPY (N/A) VIDEO ASSISTED THORACOSCOPY (VATS)/DECORTICATION AND EMPYEMA (Right)  Total Length of Stay:  LOS: 7 days   Subjective: Sedated on vent   Objective: Vital signs in last 24 hours: Temp:  [97.4 F (36.3 C)-98.2 F (36.8 C)] 98.2 F (36.8 C) (03/05 0400) Pulse Rate:  [54-78] 69 (03/05 0700) Cardiac Rhythm: Normal sinus rhythm (03/05 0400) Resp:  [13-25] 20 (03/05 0700) BP: (78-118)/(59-78) 78/59 (03/05 0700) SpO2:  [90 %-98 %] 91 % (03/05 0700) Arterial Line BP: (92-125)/(49-68) 92/55 (03/05 0700) FiO2 (%):  [40 %] 40 % (03/05 0400) Weight:  [166 lb 7.2 oz (75.5 kg)] 166 lb 7.2 oz (75.5 kg) (03/05 0500)  Filed Weights   10/28/16 0234 10/29/16 0500 10/30/16 0500  Weight: 146 lb 6.2 oz (66.4 kg) 165 lb 5.5 oz (75 kg) 166 lb 7.2 oz (75.5 kg)    Weight change: 1 lb 1.6 oz (0.5 kg)   Hemodynamic parameters for last 24 hours:    Intake/Output from previous day: 03/04 0701 - 03/05 0700 In: 4372.6 [I.V.:3687.6; NG/GT:535; IV Piggyback:150] Out: 910 [Urine:840; Chest Tube:70]  Intake/Output this shift: No intake/output data recorded.  Current Meds: Scheduled Meds: . cefTRIAXone (ROCEPHIN)  IV  2 g Intravenous Q0600  . chlorhexidine gluconate (MEDLINE KIT)  15 mL Mouth Rinse BID  . Chlorhexidine Gluconate Cloth  6 each Topical Daily  . enoxaparin (LOVENOX) injection  40 mg Subcutaneous Q24H  . famotidine (PEPCID) IV  20 mg Intravenous Q12H  . feeding supplement (VITAL AF 1.2 CAL)  1,000 mL Per Tube Q24H  . folic acid  1 mg Intravenous Daily  . ipratropium-albuterol  3 mL Nebulization Q6H  . lactulose  30 g Per Tube BID  . mouth rinse  15 mL Mouth Rinse 10 times per day  . multivitamin  15 mL Per Tube Daily    . nicotine  14 mg Transdermal Daily  . sodium chloride flush  10-40 mL Intracatheter Q12H  . thiamine injection  100 mg Intravenous Daily   Continuous Infusions: . dexmedetomidine 1.2 mcg/kg/hr (10/30/16 0600)  . dextrose 5 % and 0.9 % NaCl with KCl 20 mEq/L 75 mL/hr at 10/30/16 0400  . fentaNYL infusion INTRAVENOUS 50 mcg/hr (10/30/16 0400)  . lactated ringers 50 mL/hr at 10/30/16 0400  . phenylephrine (NEO-SYNEPHRINE) Adult infusion 30 mcg/min (10/30/16 0400)   PRN Meds:.sodium chloride, acetaminophen, docusate, fentaNYL (SUBLIMAZE) injection, ondansetron (ZOFRAN) IV, potassium chloride (KCL MULTIRUN) 30 mEq in 265 mL IVPB, sodium chloride flush  General appearance: slowed mentation Neurologic: sedated on vent  Heart: regular rate and rhythm, S1, S2 normal, no murmur, click, rub or gallop Lungs: diminished breath sounds bibasilar Abdomen: soft, non-tender; bowel sounds normal; no masses,  no organomegaly Extremities: extremities normal, atraumatic, no cyanosis or edema and Homans sign is negative, no sign of DVT Wound: intact, two chest tubes in place, one tube kinked this am  Lab Results: CBC: Recent Labs  10/29/16 0415 10/30/16 0415  WBC 19.1* 24.6*  HGB 10.5* 10.9*  HCT 32.3* 33.8*  PLT 124* 120*   BMET:  Recent Labs  10/29/16 0415 10/30/16 0415  NA 142 143  K 3.6 3.6  CL 115* 117*  CO2 23 23  GLUCOSE 122* 126*  BUN 14 17  CREATININE 0.47* 0.44*  CALCIUM 7.7* 7.9*    CMET: Lab Results  Component Value Date   WBC 24.6 (H) 10/30/2016   HGB 10.9 (L) 10/30/2016   HCT 33.8 (L) 10/30/2016   PLT 120 (L) 10/30/2016   GLUCOSE 126 (H) 10/30/2016   TRIG 40 10/29/2016   ALT 34 10/26/2016   AST 74 (H) 10/26/2016   NA 143 10/30/2016   K 3.6 10/30/2016   CL 117 (H) 10/30/2016   CREATININE 0.44 (L) 10/30/2016   BUN 17 10/30/2016   CO2 23 10/30/2016   TSH 5.250 (H) 05/15/2014   INR 1.57 10/23/2016      PT/INR: No results for input(s): LABPROT, INR in the  last 72 hours. Radiology: No results found.   Assessment/Plan: S/P Procedure(s) (LRB): VIDEO BRONCHOSCOPY (N/A) VIDEO ASSISTED THORACOSCOPY (VATS)/DECORTICATION AND EMPYEMA (Right) Continued plans per ccm, would leave chest tubes in place for now      Grace Isaac 10/30/2016 7:13 AM

## 2016-10-30 NOTE — Progress Notes (Signed)
Nutrition Follow-up  DOCUMENTATION CODES:   Non-severe (moderate) malnutrition in context of chronic illness  INTERVENTION:   If supportive care desired, recommend increasing Vital AF 1.2 to goal rate of 65 mL/hr    Tube feed regimen provides 1872 kcal, 117 grams of protein, and 1263 mL of free water.  NUTRITION DIAGNOSIS:   Inadequate oral intake related to inability to eat as evidenced by NPO status.  Ongoing  GOAL:   Patient will meet greater than or equal to 90% of their needs  Unmet  MONITOR:   Vent status, TF tolerance, Weight trends, I & O's, Skin, Labs  ASSESSMENT:   56 year old male with a history of tobacco abuse, snake bite, cocaine abuse, hepatitis C came to the [facility] emergency department on 10/06/2016 complaining of shortness of breath. He had been seen one day prior for pneumonia and it was recommended that he be admitted for further treatment but he left AGAINST MEDICAL ADVICE. He had been prescribed Levaquin. He took the antibiotics, but he stated that his dyspnea progressed so he called 911. In the emergency department his breathing improved briefly with the application of positive pressure ventilation but his respiratory status continued to decline so he was intubated by the emergency room physicians.  Patient remains sedated on full vent support, failing SBT. Per MD note, may ned a tracheostomy sooner than later, agitation of pt is barrier to weaning.   Mv: 10.5 Temp: 36.4  Propofol discontinued since 3/3.  Vital AF 1.2 formula currently infusing at 10 mL/hr via OG tube. Rate decreased due to patient abdominal distension. Per RN , distension continues possibly due to ascites. Family is considering DNR, comfort care measures.  Meds Reviewed: Folic Acid, Lasix, lactulose, MVI, Thiamine, lactated ringers infusion  Labs Reviewed: Klonopin, Chloride 117, Creatinine .44, Calcium 7.9  Diet Order:  Diet NPO time specified  Skin:  Reviewed, no  issues  Last BM:  3/5  Height:   Ht Readings from Last 1 Encounters:  10/23/16 '5\' 11"'$  (1.803 m)    Weight:   Wt Readings from Last 1 Encounters:  10/30/16 166 lb 7.2 oz (75.5 kg)    Ideal Body Weight:  78.18 kg  BMI:  Body mass index is 23.21 kg/m.  Estimated Nutritional Needs:   Kcal:  1746  Protein:  100-120 grams  Fluid:  >/=1.5 L/day  EDUCATION NEEDS:   No education needs identified at this time  Juliann Pulse M.S. Nutrition Dietetic Intern

## 2016-10-30 NOTE — Progress Notes (Signed)
Pt was manually ventilated 100% bagged lavage due to desaturation, pt has rhonchus BBS. Got back moderate amount of tan/pink tinged thick secretions and mucous plugs. Tolerated it well no distress noted.

## 2016-10-31 ENCOUNTER — Inpatient Hospital Stay (HOSPITAL_COMMUNITY): Payer: Medicaid Other

## 2016-10-31 DIAGNOSIS — R140 Abdominal distension (gaseous): Secondary | ICD-10-CM

## 2016-10-31 DIAGNOSIS — K7031 Alcoholic cirrhosis of liver with ascites: Secondary | ICD-10-CM

## 2016-10-31 LAB — CBC WITH DIFFERENTIAL/PLATELET
BASOS PCT: 0 %
Basophils Absolute: 0 10*3/uL (ref 0.0–0.1)
EOS ABS: 0.2 10*3/uL (ref 0.0–0.7)
Eosinophils Relative: 1 %
HEMATOCRIT: 33 % — AB (ref 39.0–52.0)
HEMOGLOBIN: 10.6 g/dL — AB (ref 13.0–17.0)
LYMPHS ABS: 2 10*3/uL (ref 0.7–4.0)
Lymphocytes Relative: 9 %
MCH: 30 pg (ref 26.0–34.0)
MCHC: 32.1 g/dL (ref 30.0–36.0)
MCV: 93.5 fL (ref 78.0–100.0)
MONOS PCT: 6 %
Monocytes Absolute: 1.3 10*3/uL — ABNORMAL HIGH (ref 0.1–1.0)
NEUTROS ABS: 18.9 10*3/uL — AB (ref 1.7–7.7)
NEUTROS PCT: 84 %
Platelets: 123 10*3/uL — ABNORMAL LOW (ref 150–400)
RBC: 3.53 MIL/uL — AB (ref 4.22–5.81)
RDW: 17.2 % — ABNORMAL HIGH (ref 11.5–15.5)
WBC: 22.4 10*3/uL — AB (ref 4.0–10.5)

## 2016-10-31 LAB — MAGNESIUM
MAGNESIUM: 1.8 mg/dL (ref 1.7–2.4)
Magnesium: 2.1 mg/dL (ref 1.7–2.4)

## 2016-10-31 LAB — BASIC METABOLIC PANEL
ANION GAP: 6 (ref 5–15)
Anion gap: 6 (ref 5–15)
BUN: 16 mg/dL (ref 6–20)
BUN: 18 mg/dL (ref 6–20)
CALCIUM: 8.2 mg/dL — AB (ref 8.9–10.3)
CHLORIDE: 113 mmol/L — AB (ref 101–111)
CO2: 24 mmol/L (ref 22–32)
CO2: 22 mmol/L (ref 22–32)
CREATININE: 0.54 mg/dL — AB (ref 0.61–1.24)
Calcium: 8.2 mg/dL — ABNORMAL LOW (ref 8.9–10.3)
Chloride: 116 mmol/L — ABNORMAL HIGH (ref 101–111)
Creatinine, Ser: 0.46 mg/dL — ABNORMAL LOW (ref 0.61–1.24)
GFR calc Af Amer: >60 mL/min (ref >60)
GFR calc Af Amer: >60 mL/min (ref >60)
GFR calc non Af Amer: >60 mL/min (ref >60)
GFR calc non Af Amer: >60 mL/min (ref >60)
GLUCOSE: 157 mg/dL — AB (ref 65–99)
Glucose, Bld: 107 mg/dL — ABNORMAL HIGH (ref 65–99)
POTASSIUM: 3 mmol/L — AB (ref 3.5–5.1)
POTASSIUM: 4.2 mmol/L (ref 3.5–5.1)
SODIUM: 143 mmol/L (ref 135–145)
SODIUM: 144 mmol/L (ref 135–145)

## 2016-10-31 LAB — GLUCOSE, CAPILLARY
GLUCOSE-CAPILLARY: 101 mg/dL — AB (ref 65–99)
GLUCOSE-CAPILLARY: 142 mg/dL — AB (ref 65–99)
GLUCOSE-CAPILLARY: 98 mg/dL (ref 65–99)
Glucose-Capillary: 155 mg/dL — ABNORMAL HIGH (ref 65–99)
Glucose-Capillary: 161 mg/dL — ABNORMAL HIGH (ref 65–99)
Glucose-Capillary: 145 mg/dL — ABNORMAL HIGH (ref 65–99)

## 2016-10-31 LAB — PHOSPHORUS: PHOSPHORUS: 4 mg/dL (ref 2.5–4.6)

## 2016-10-31 LAB — ALBUMIN: ALBUMIN: 1.7 g/dL — AB (ref 3.5–5.0)

## 2016-10-31 LAB — POTASSIUM: POTASSIUM: 3.3 mmol/L — AB (ref 3.5–5.1)

## 2016-10-31 MED ORDER — POTASSIUM CHLORIDE 20 MEQ/15ML (10%) PO SOLN
40.0000 meq | Freq: Once | ORAL | Status: AC
  Administered 2016-10-31: 40 meq via ORAL
  Filled 2016-10-31: qty 30

## 2016-10-31 MED ORDER — ALBUMIN HUMAN 25 % IV SOLN
25.0000 g | Freq: Two times a day (BID) | INTRAVENOUS | Status: AC
  Administered 2016-10-31 – 2016-11-01 (×2): 25 g via INTRAVENOUS
  Filled 2016-10-31 (×3): qty 50

## 2016-10-31 MED ORDER — MAGNESIUM SULFATE 2 GM/50ML IV SOLN
2.0000 g | Freq: Once | INTRAVENOUS | Status: AC
  Administered 2016-10-31: 2 g via INTRAVENOUS
  Filled 2016-10-31: qty 50

## 2016-10-31 MED ORDER — SODIUM CHLORIDE 0.9 % IV SOLN
30.0000 meq | Freq: Once | INTRAVENOUS | Status: AC
  Administered 2016-10-31: 30 meq via INTRAVENOUS
  Filled 2016-10-31: qty 15

## 2016-10-31 MED ORDER — VITAL AF 1.2 CAL PO LIQD
1000.0000 mL | ORAL | Status: DC
  Administered 2016-10-31 – 2016-11-01 (×3): 1000 mL

## 2016-10-31 NOTE — Progress Notes (Signed)
[facility] ADULT ICU REPLACEMENT PROTOCOL FOR AM LAB REPLACEMENT ONLY  The patient does not apply for the [facility] Adult ICU Electrolyte Replacment Protocol based on the criteria listed below:      Abnormal electrolyte(s): K 3.0  , Mg 1.8   If a panic level lab has been reported, has the CCM MD in charge been notified? Yes.  .   Physician:  Franco Collet, MD [facility] 10/31/2016 5:29 AM

## 2016-10-31 NOTE — Progress Notes (Signed)
      [address]       [place] [address]             [phone]      POD # 7   Intubated, sedated  BP 98/68   Pulse 75   Temp 97.3 F (36.3 C) (Axillary)   Resp (!) 22   Ht '5\' 11"'$  (1.803 m)   Wt 172 lb 6.4 oz (78.2 kg)   SpO2 97%   BMI 24.04 kg/m   Intake/Output Summary (Last 24 hours) at 10/31/16 1711 Last data filed at 10/31/16 1700  Gross per 24 hour  Intake          4662.84 ml  Output             3475 ml  Net          1187.84 ml   Continue current care  Jacson Rapaport C. Roxan Hockey, MD [facility] [phone]

## 2016-10-31 NOTE — Progress Notes (Signed)
Patient ID: Mario Andrews, male   DOB: August 16, 1961, 56 y.o.   MRN: [id number] TCTS DAILY ICU PROGRESS NOTE                   [address]            [place] [address]          [phone]   7 Days Post-Op Procedure(s) (LRB): VIDEO BRONCHOSCOPY (N/A) VIDEO ASSISTED THORACOSCOPY (VATS)/DECORTICATION AND EMPYEMA (Right)  Total Length of Stay:  LOS: 8 days   Subjective: Sedated on vent   Objective: Vital signs in last 24 hours: Temp:  [97 F (36.1 C)-97.5 F (36.4 C)] 97.3 F (36.3 C) (03/06 0400) Pulse Rate:  [58-81] 59 (03/06 0700) Cardiac Rhythm: Normal sinus rhythm (03/06 0600) Resp:  [13-25] 18 (03/06 0700) BP: (81-100)/(55-74) 89/63 (03/06 0700) SpO2:  [87 %-98 %] 92 % (03/06 0700) Arterial Line BP: (87-123)/(48-72) 98/49 (03/06 0700) FiO2 (%):  [40 %-100 %] 40 % (03/06 0700) Weight:  [172 lb 6.4 oz (78.2 kg)] 172 lb 6.4 oz (78.2 kg) (03/06 0500)  Filed Weights   10/29/16 0500 10/30/16 0500 10/31/16 0500  Weight: 165 lb 5.5 oz (75 kg) 166 lb 7.2 oz (75.5 kg) 172 lb 6.4 oz (78.2 kg)    Weight change: 5 lb 15.2 oz (2.7 kg)   Hemodynamic parameters for last 24 hours:    Intake/Output from previous day: 03/05 0701 - 03/06 0700 In: 3531.5 [I.V.:2720.5; NG/GT:196; IV Piggyback:615] Out: 6237 [Urine:3660; Chest Tube:50]  Intake/Output this shift: No intake/output data recorded.  Current Meds: Scheduled Meds: . cefTRIAXone (ROCEPHIN)  IV  2 g Intravenous Q0600  . chlorhexidine gluconate (MEDLINE KIT)  15 mL Mouth Rinse BID  . Chlorhexidine Gluconate Cloth  6 each Topical Daily  . clonazepam  0.5 mg Oral BID  . enoxaparin (LOVENOX) injection  40 mg Subcutaneous Q24H  . famotidine (PEPCID) IV  20 mg Intravenous Q12H  . feeding supplement (VITAL AF 1.2 CAL)  1,000 mL Per Tube Q24H  . folic acid  1 mg Intravenous Daily  . furosemide  20 mg Intravenous Q12H  . ipratropium-albuterol  3 mL Nebulization Q6H  . lactulose  30 g Per Tube BID  . mouth  rinse  15 mL Mouth Rinse 10 times per day  . multivitamin  15 mL Per Tube Daily  . nicotine  14 mg Transdermal Daily  . potassium chloride (KCL MULTIRUN) 30 mEq in 265 mL IVPB  30 mEq Intravenous Once  . sodium chloride flush  10-40 mL Intracatheter Q12H  . thiamine injection  100 mg Intravenous Daily   Continuous Infusions: . dexmedetomidine 0.6 mcg/kg/hr (10/31/16 0740)  . dextrose 5 % and 0.9 % NaCl with KCl 20 mEq/L 10 mL/hr at 10/31/16 0700  . fentaNYL infusion INTRAVENOUS 25 mcg/hr (10/31/16 0740)  . lactated ringers 50 mL/hr at 10/31/16 0700  . phenylephrine (NEO-SYNEPHRINE) Adult infusion 40 mcg/min (10/31/16 0700)   PRN Meds:.sodium chloride, acetaminophen, docusate, fentaNYL (SUBLIMAZE) injection, midazolam, midazolam, ondansetron (ZOFRAN) IV, potassium chloride (KCL MULTIRUN) 30 mEq in 265 mL IVPB, sodium chloride flush  No air leak or drainage from chest tubes   Lab Results: CBC: Recent Labs  10/30/16 0415 10/31/16 0338  WBC 24.6* 22.4*  HGB 10.9* 10.6*  HCT 33.8* 33.0*  PLT 120* 123*   BMET:  Recent Labs  10/30/16 0415 10/31/16 0338  NA 143 143  K 3.6 3.0*  CL 117* 113*  CO2 23 24  GLUCOSE 126* 107*  BUN 17 16  CREATININE 0.44* 0.46*  CALCIUM 7.9* 8.2*    CMET: Lab Results  Component Value Date   WBC 22.4 (H) 10/31/2016   HGB 10.6 (L) 10/31/2016   HCT 33.0 (L) 10/31/2016   PLT 123 (L) 10/31/2016   GLUCOSE 107 (H) 10/31/2016   TRIG 40 10/29/2016   ALT 34 10/26/2016   AST 74 (H) 10/26/2016   NA 143 10/31/2016   K 3.0 (L) 10/31/2016   CL 113 (H) 10/31/2016   CREATININE 0.46 (L) 10/31/2016   BUN 16 10/31/2016   CO2 24 10/31/2016   TSH 5.250 (H) 05/15/2014   INR 1.57 10/23/2016      PT/INR: No results for input(s): LABPROT, INR in the last 72 hours. Radiology: Dg Chest Port 1 View  Result Date: 10/31/2016 CLINICAL DATA:  Right empyema with right upper lobe lung cancer. EXAM: PORTABLE CHEST 1 VIEW COMPARISON:  10/30/2016 FINDINGS: Right chest  tubes, endotracheal tube, NG tube and central venous catheter remain in good position. No residual pneumothorax. Persistent consolidation and cavitation in the right upper lobe. Slightly increased left effusion. Small residual right effusion. IMPRESSION: Slight increase in the left effusion since the prior exam. No other significant change. Electronically Signed   By: Lorriane Shire M.D.   On: 10/31/2016 07:58     Assessment/Plan: S/P Procedure(s) (LRB): VIDEO BRONCHOSCOPY (N/A) VIDEO ASSISTED THORACOSCOPY (VATS)/DECORTICATION AND EMPYEMA (Right) d/c tubes/lines- d/c one chest tube , leave remaining tube  Continued rx, vent per ccm.     Grace Isaac 10/31/2016 8:23 AM

## 2016-10-31 NOTE — Progress Notes (Signed)
Notified Dr. Corrie Dandy Potassium was 3.3 and Mag 2.1. Per MD will order 73mq down tube. Will continue to monitor.

## 2016-10-31 NOTE — Progress Notes (Signed)
[facility]  56 year old man with past medical history of cocaine and alcohol abuse with COPD and hepatitis C admitted with a postobstructive pneumonia and empyema, found to have non-small cell cancer and bronchoscopy blocking the right upper lobe bronchus, status post VATS on 2/27  Subjective:   Very agitated on 3/5 pm.  More "sedated" this am. Doing 12/5 PST >> tachypneic. diuresced well with lasix + albumin  Objective   Temp:  [97.3 F (36.3 C)-97.5 F (36.4 C)] 97.3 F (36.3 C) (03/06 0400) Pulse Rate:  [58-77] 77 (03/06 0900) Resp:  [13-25] 22 (03/06 0900) BP: (83-100)/(55-74) 98/64 (03/06 0900) SpO2:  [87 %-98 %] 95 % (03/06 0900) Arterial Line BP: (87-123)/(48-72) 107/55 (03/06 0900) FiO2 (%):  [40 %-100 %] 50 % (03/06 0834) Weight:  [78.2 kg (172 lb 6.4 oz)] 78.2 kg (172 lb 6.4 oz) (03/06 0500) . dexmedetomidine 0.6 mcg/kg/hr (10/31/16 0740)  . dextrose 5 % and 0.9 % NaCl with KCl 20 mEq/L 10 mL/hr at 10/31/16 0800  . fentaNYL infusion INTRAVENOUS 25 mcg/hr (10/31/16 0740)  . lactated ringers 50 mL/hr at 10/31/16 0800  . phenylephrine (NEO-SYNEPHRINE) Adult infusion 40 mcg/min (10/31/16 0700)    Intake/Output Summary (Last 24 hours) at 10/31/16 1035 Last data filed at 10/31/16 0903  Gross per 24 hour  Intake           3265.7 ml  Output             3810 ml  Net           -544.3 ml    General appearance:  Cachectic, intubated sedated , temporal wasting. In resp distress on PST Eyes: anicteric sclerae , moist conjunctivae; PERRL, EOMI bilaterally. Mouth:  membranes and no mucosal ulcerations; normal hard and soft palate, orally intubated.  Neck: Trachea midline; neck supple, no JVD Lungs/chest: good ae. Crackles at bases. tachypneic on the vent.  CV: RRR, no MRGs  Abdomen: Soft, very distended, non-tender; no masses or HSM Extremities: pitting LE edema un to the thighs Skin: Normal temperature, turgor and texture; no rash Neuro/Psych: sedated.  GCS 4.  CBC Recent Labs     10/29/16  0415  10/30/16  0415  10/31/16  0338  WBC  19.1*  24.6*  22.4*  HGB  10.5*  10.9*  10.6*  HCT  32.3*  33.8*  33.0*  PLT  124*  120*  123*    Coag's No results for input(s): APTT, INR in the last 72 hours.  BMET Recent Labs     10/29/16  0415  10/30/16  0415  10/31/16  0338  NA  142  143  143  K  3.6  3.6  3.0*  CL  115*  117*  113*  CO2  '23  23  24  '$ BUN  '14  17  16  '$ CREATININE  0.47*  0.44*  0.46*  GLUCOSE  122*  126*  107*    Electrolytes Recent Labs     10/29/16  0415  10/30/16  0415  10/31/16  0338  CALCIUM  7.7*  7.9*  8.2*  MG  2.0  1.8  1.8  PHOS  3.7  3.6  4.0    Sepsis Markers No results for input(s): PROCALCITON, O2SATVEN in the last 72 hours.  Invalid input(s): LACTICACIDVEN  ABG No results for input(s): PHART, PCO2ART, PO2ART in the last 72 hours.  Liver Enzymes Recent Labs     10/29/16  0415  10/30/16  0415  10/31/16  0338  ALBUMIN  1.2*  1.1*  1.7*    Cardiac Enzymes No results for input(s): TROPONINI, PROBNP in the last 72 hours.  Glucose Recent Labs     10/30/16  1309  10/30/16  1612  10/30/16  2009  10/30/16  2311  10/31/16  0335  10/31/16  0758  GLUCAP  99  97  98  116*  98  101*    Imaging Dg Chest Port 1 View  Result Date: 10/31/2016 CLINICAL DATA:  Right empyema with right upper lobe lung cancer. EXAM: PORTABLE CHEST 1 VIEW COMPARISON:  10/30/2016 FINDINGS: Right chest tubes, endotracheal tube, NG tube and central venous catheter remain in good position. No residual pneumothorax. Persistent consolidation and cavitation in the right upper lobe. Slightly increased left effusion. Small residual right effusion. IMPRESSION: Slight increase in the left effusion since the prior exam. No other significant change. Electronically Signed   By: Lorriane Shire M.D.   On: 10/31/2016 07:58   Dg Chest Port 1 View  Result Date: 10/30/2016 CLINICAL DATA:  Right empyema with right upper lobe lung  cancer. EXAM: PORTABLE CHEST 1 VIEW COMPARISON:  10/29/2016 and 10/27/2016 FINDINGS: Endotracheal tube tip is 16 mm above the carina and could be retracted slightly. Central line and NG tube appear in good position. Two right-sided chest tubes remain in place. No pneumothorax. Persistent consolidation and cavitation in the right upper lobe. Persistent atelectasis and slight consolidation in the left lower lobe is unchanged. Heart size and vascularity are normal. IMPRESSION: 1. Endotracheal tube is 15 mm above the carina, the could be slightly retracted. 2. The persistent consolidation and cavitation in the right upper lobe, stable. 3. Persistent atelectasis/consolidation in the left lower lobe. Electronically Signed   By: Lorriane Shire M.D.   On: 10/30/2016 07:56    LINES/TUBES: OETT 7.5 2/26 >>> Foley 2/26 >>> OGT 2/26 >>> Right Chest Tube x2 2/27 >>> R IJ CVL 2/26 >>> R Radial Art Line 2/27 >>> PIV    IMAGING/STUDIES: CT CHEST W/O 2/26:  Previously reviewed by me. Liver cirrhosis with a small amount of ascites. Enlarged precarinal lymph nodes. Emphysema noted predominantly apical on the left. Dense consolidation on the right with pockets and what seems to be air-fluid levels. Appears to have a pleural effusion on the right as well. TTE 2/27:Poor quality imaging. LV cavity size normal. EF 55-60% with no regional wall motion abnormalities. Unable to assess diastolic function. No obvious aortic stenosis or regurgitation with poorly visualized valve. Aortic root normal in size. No significant mitral regurgitation. Left atrium normal in size. Right ventricle and right Atrium poorly visualized. Moderate-sized pericardial effusion apically and posterior to the heart. 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  PERITONEAL FLUID 3/1:  WBC 28 (6% lymph, 56% neutrophils, 38% monocytes)   MICROBIOLOGY: HIV 1/2 2/23:  Nonreactive Urine Culture 2/23:  <10,000 insignificant growth Blood Culture x1 2/24 >>>ng MRSA PCR 2/26:  Negative  Blood Cultures x2 2/25 >>> Tracheal Aspirate Culture 2/26:  Normal flora  Respiratory Panel PCR 2/26:  Negative  Influenza A/B PCR 2/26:  Negative  Urine Streptococcal Antigen 2/26:  Negative  Urine Legionella Antigen 2/26:  Negative  Bronchial Wash Right 2/27 >>>ng Right Pleural Fluid Culture 2/27 >>>ng Peritoneal Fluid Culture 3/1 >>>ng  ANTIBIOTICS:  Cefepime 2/26 (1 dose) Vancomycin 2/26 (1 dose) Tamiflu 2/26 (stopped) Azithromycin 2/26 - 3/1 Rocephin 2/26 >>>  SIGNIFICANT EVENTS: 02/26 - Admit 02/27 - VATS w/ decortication & bronchoscopy by Dr. Servando Snare 02/28 - Tube feeds held with increased abdominal distension 03/01 - Paracentesis performed at bedside  ASSESSMENT/PLAN:  Pulmonary/ID Acute hypoxic respiratory Failure in the setting of severe CAP and post-obstructive PNA c/b right sided parapneumonic effusion/empeyma.  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  Bronchopleural fistula  Plan Daily PST. He looks tachypneic on PST 12/5.  I suspect he will need a trache if he is not extubated by the end of the week.  CT management per TCVS.  CT x 1 to be taken out today.  Ct rocephin x 14d total abx Cont scheduled BDs May need radiation to RUL to facilitate wean if remains vent dependent. See [place] below.    Neuro Acute encephalopathy in setting of polysubstance abuse and ETOH abuse.   Plan Cont thiamine, folic acid and nicotine patch Cont precedex gtt and fentanyl drip and pushes Cont versed prn Cont clonazepam for now. Started on 3/5.  Cont lactulose    CARDS Mod pericardial effusion w/out tamponade Hypotension- related to sedative agents. Stable.  Volume  overload Plan Cont tele Low dose neo gtt Diuresce with lasix + albumin   HEME Right upper lobe NSCLCA. New dx from endobronchial Bx. No evidence of pleural involvement Anemia of critical illness Thrombocytopenia in setting of cirrhosis  Plan PET scan eventually F/u cbc Cont LMWH May need radiation if with compromise of airway vs stent. Does not look like airway  is narrowed now though based on CXR. Holding off for now.    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  Brother Richardson Landry (who makes decision) is adamant to do everything and pt is to remain full code.  Most likely will need a trache and a SNF.   I spent  30  minutes of Critical Care time with this patient today.   Monica Becton, MD 10/31/2016, 10:35 AM [facility] Pager [phone] After 3 pm or if no answer, call [phone]

## 2016-10-31 NOTE — Progress Notes (Signed)
[facility] Progress Note Patient Name: BELMONT VALLI DOB: 1961/05/20 MRN: [id number]   Date of Service  10/31/2016  HPI/Events of Note    eICU Interventions  Magnesium replaced. Note KCl replacement protocol already ordered./      Intervention Category Minor Interventions: Electrolytes abnormality - evaluation and management  Taitum Menton S. 10/31/2016, 5:32 AM

## 2016-11-01 ENCOUNTER — Inpatient Hospital Stay (HOSPITAL_COMMUNITY): Payer: Medicaid Other

## 2016-11-01 LAB — GLUCOSE, CAPILLARY
GLUCOSE-CAPILLARY: 144 mg/dL — AB (ref 65–99)
GLUCOSE-CAPILLARY: 128 mg/dL — AB (ref 65–99)
GLUCOSE-CAPILLARY: 126 mg/dL — AB (ref 65–99)
GLUCOSE-CAPILLARY: 107 mg/dL — AB (ref 65–99)
Glucose-Capillary: 118 mg/dL — ABNORMAL HIGH (ref 65–99)
Glucose-Capillary: 99 mg/dL (ref 65–99)

## 2016-11-01 LAB — CBC WITH DIFFERENTIAL/PLATELET
BASOS PCT: 0 %
Basophils Absolute: 0 10*3/uL (ref 0.0–0.1)
EOS PCT: 0 %
Eosinophils Absolute: 0 10*3/uL (ref 0.0–0.7)
HEMATOCRIT: 29.4 % — AB (ref 39.0–52.0)
Hemoglobin: 9.4 g/dL — ABNORMAL LOW (ref 13.0–17.0)
LYMPHS ABS: 1.5 10*3/uL (ref 0.7–4.0)
Lymphocytes Relative: 6 %
MCH: 29.9 pg (ref 26.0–34.0)
MCHC: 32 g/dL (ref 30.0–36.0)
MCV: 93.6 fL (ref 78.0–100.0)
MONO ABS: 1 10*3/uL (ref 0.1–1.0)
Monocytes Relative: 4 %
Neutro Abs: 22.9 10*3/uL — ABNORMAL HIGH (ref 1.7–7.7)
Neutrophils Relative %: 90 %
Platelets: 135 10*3/uL — ABNORMAL LOW (ref 150–400)
RBC: 3.14 MIL/uL — ABNORMAL LOW (ref 4.22–5.81)
RDW: 17.1 % — AB (ref 11.5–15.5)
WBC: 25.4 10*3/uL — ABNORMAL HIGH (ref 4.0–10.5)

## 2016-11-01 LAB — ALBUMIN: ALBUMIN: 2.2 g/dL — AB (ref 3.5–5.0)

## 2016-11-01 LAB — COMPREHENSIVE METABOLIC PANEL
ALK PHOS: 223 U/L — AB (ref 38–126)
ALT: 43 U/L (ref 17–63)
ANION GAP: 5 (ref 5–15)
AST: 95 U/L — ABNORMAL HIGH (ref 15–41)
Albumin: 1.9 g/dL — ABNORMAL LOW (ref 3.5–5.0)
BUN: 27 mg/dL — ABNORMAL HIGH (ref 6–20)
CALCIUM: 8.4 mg/dL — AB (ref 8.9–10.3)
CO2: 24 mmol/L (ref 22–32)
Chloride: 116 mmol/L — ABNORMAL HIGH (ref 101–111)
Creatinine, Ser: 0.84 mg/dL (ref 0.61–1.24)
GFR calc non Af Amer: >60 mL/min (ref >60)
GLUCOSE: 124 mg/dL — AB (ref 65–99)
POTASSIUM: 4.7 mmol/L (ref 3.5–5.1)
SODIUM: 145 mmol/L (ref 135–145)
Total Bilirubin: 1.3 mg/dL — ABNORMAL HIGH (ref 0.3–1.2)
Total Protein: 5.9 g/dL — ABNORMAL LOW (ref 6.5–8.1)

## 2016-11-01 LAB — POCT I-STAT 3, ART BLOOD GAS (G3+)
Acid-base deficit: 2 mmol/L (ref 0.0–2.0)
BICARBONATE: 23.2 mmol/L (ref 20.0–28.0)
O2 Saturation: 93 %
PCO2 ART: 41.5 mmHg (ref 32.0–48.0)
TCO2: 24 mmol/L (ref 0–100)
pH, Arterial: 7.355 (ref 7.350–7.450)
pO2, Arterial: 70 mmHg — ABNORMAL LOW (ref 83.0–108.0)

## 2016-11-01 LAB — BLOOD GAS, ARTERIAL
ACID-BASE DEFICIT: 1.1 mmol/L (ref 0.0–2.0)
Bicarbonate: 23.4 mmol/L (ref 20.0–28.0)
Drawn by: 441661
FIO2: 50
LHR: 14 {breaths}/min
O2 Saturation: 91.3 %
PEEP/CPAP: 5 cmH2O
PH ART: 7.379 (ref 7.350–7.450)
Patient temperature: 98.6
VT: 560 mL
pCO2 arterial: 40.5 mmHg (ref 32.0–48.0)
pO2, Arterial: 69.6 mmHg — ABNORMAL LOW (ref 83.0–108.0)

## 2016-11-01 LAB — BASIC METABOLIC PANEL
Anion gap: 6 (ref 5–15)
BUN: 21 mg/dL — AB (ref 6–20)
CHLORIDE: 115 mmol/L — AB (ref 101–111)
CO2: 22 mmol/L (ref 22–32)
CREATININE: 0.61 mg/dL (ref 0.61–1.24)
Calcium: 8.5 mg/dL — ABNORMAL LOW (ref 8.9–10.3)
GFR calc Af Amer: >60 mL/min (ref >60)
GFR calc non Af Amer: >60 mL/min (ref >60)
Glucose, Bld: 140 mg/dL — ABNORMAL HIGH (ref 65–99)
Potassium: 3.9 mmol/L (ref 3.5–5.1)
Sodium: 143 mmol/L (ref 135–145)

## 2016-11-01 LAB — MAGNESIUM: Magnesium: 2.1 mg/dL (ref 1.7–2.4)

## 2016-11-01 LAB — PHOSPHORUS: Phosphorus: 4.2 mg/dL (ref 2.5–4.6)

## 2016-11-01 MED ORDER — SODIUM CHLORIDE 0.9 % IV SOLN
0.0000 mg/h | INTRAVENOUS | Status: DC
  Administered 2016-11-01: 5 mg/h via INTRAVENOUS
  Administered 2016-11-01 – 2016-11-02 (×2): 1 mg/h via INTRAVENOUS
  Administered 2016-11-03: 2 mg/h via INTRAVENOUS
  Filled 2016-11-01 (×5): qty 10

## 2016-11-01 MED ORDER — MIDAZOLAM HCL 2 MG/2ML IJ SOLN
2.0000 mg | INTRAMUSCULAR | Status: DC | PRN
  Filled 2016-11-01 (×2): qty 2

## 2016-11-01 MED ORDER — FUROSEMIDE 10 MG/ML IJ SOLN
40.0000 mg | Freq: Two times a day (BID) | INTRAMUSCULAR | Status: DC
  Administered 2016-11-01 (×2): 40 mg via INTRAVENOUS
  Filled 2016-11-01 (×2): qty 4

## 2016-11-01 MED ORDER — MIDAZOLAM HCL 2 MG/2ML IJ SOLN
2.0000 mg | INTRAMUSCULAR | Status: DC | PRN
  Administered 2016-11-03: 2 mg via INTRAVENOUS

## 2016-11-01 MED ORDER — MIDAZOLAM BOLUS VIA INFUSION
1.0000 mg | INTRAVENOUS | Status: DC | PRN
  Filled 2016-11-01: qty 2

## 2016-11-01 NOTE — Progress Notes (Signed)
Arterial line set up changed per Protocol. No complications noted.

## 2016-11-01 NOTE — Progress Notes (Signed)
[facility]  56 year old man with past medical history of cocaine and alcohol abuse with COPD and hepatitis C admitted with a postobstructive pneumonia and empyema, found to have non-small cell cancer and bronchoscopy blocking the right upper lobe bronchus, status post VATS on 2/27. Remains critically on the vent.   Subjective:  Noted to be uncomfortable and "guppy breathing" this am.  VS remain the same.  ABG is acceptable.  Nods to questions.  No o2 desaturation.    Objective   Temp:  [97.8 F (36.6 C)-99.4 F (37.4 C)] 99.4 F (37.4 C) (03/07 0700) Pulse Rate:  [65-110] 97 (03/07 0835) Resp:  [17-27] 26 (03/07 0835) BP: (91-113)/(57-74) 105/71 (03/07 0835) SpO2:  [91 %-97 %] 97 % (03/07 0835) Arterial Line BP: (94-140)/(49-74) 110/59 (03/07 0800) FiO2 (%):  [40 %-50 %] 40 % (03/07 0835) Weight:  [80.3 kg (177 lb 0.5 oz)] 80.3 kg (177 lb 0.5 oz) (03/07 0500) . dexmedetomidine 1.2 mcg/kg/hr (11/01/16 0806)  . dextrose 5 % and 0.9 % NaCl with KCl 20 mEq/L 10 mL/hr at 11/01/16 0800  . fentaNYL infusion INTRAVENOUS 75 mcg/hr (11/01/16 0800)  . lactated ringers 10 mL/hr at 11/01/16 0800  . phenylephrine (NEO-SYNEPHRINE) Adult infusion 34.933 mcg/min (11/01/16 0800)    Intake/Output Summary (Last 24 hours) at 11/01/16 0852 Last data filed at 11/01/16 0800  Gross per 24 hour  Intake          3700.18 ml  Output             1386 ml  Net          2314.18 ml    General appearance:  Cachectic, intubated sedated , temporal wasting. In resp distress/uncomfortable now on the vent.  Eyes: icteric sclerae , moist conjunctivae; PERRL, EOMI bilaterally. Mouth:  membranes and no mucosal ulcerations; normal hard and soft palate, orally intubated.  Neck: Trachea midline; neck supple, no JVD Lungs/chest: good ae. Crackles at bases. tachypneic on the vent. Some rhonhi in BULF.   CV: RRR, no MRGs  Abdomen: Soft, distended, non-tender; no masses or HSM Extremities: pitting  LE edema un to the thighs Skin: Normal temperature, turgor and texture; no rash Neuro/Psych: sedated. GCS 4.  CBC Recent Labs     10/30/16  0415  10/31/16  0338  11/01/16  0352  WBC  24.6*  22.4*  25.4*  HGB  10.9*  10.6*  9.4*  HCT  33.8*  33.0*  29.4*  PLT  120*  123*  135*    Coag's No results for input(s): APTT, INR in the last 72 hours.  BMET Recent Labs     10/31/16  0338  10/31/16  1130  10/31/16  1650  11/01/16  0352  NA  143   --   144  143  K  3.0*  3.3*  4.2  3.9  CL  113*   --   116*  115*  CO2  24   --   22  22  BUN  16   --   18  21*  CREATININE  0.46*   --   0.54*  0.61  GLUCOSE  107*   --   157*  140*    Electrolytes Recent Labs     10/30/16  0415  10/31/16  0338  10/31/16  1130  10/31/16  1650  11/01/16  0352  CALCIUM  7.9*  8.2*   --   8.2*  8.5*  MG  1.8  1.8  2.1   --   2.1  PHOS  3.6  4.0   --    --   4.2    Sepsis Markers No results for input(s): PROCALCITON, O2SATVEN in the last 72 hours.  Invalid input(s): LACTICACIDVEN  ABG Recent Labs     11/01/16  0348  PHART  7.379  PCO2ART  40.5  PO2ART  69.6*    Liver Enzymes Recent Labs     10/30/16  0415  10/31/16  0338  11/01/16  0352  ALBUMIN  1.1*  1.7*  2.2*    Cardiac Enzymes No results for input(s): TROPONINI, PROBNP in the last 72 hours.  Glucose Recent Labs     10/31/16  1139  10/31/16  1650  10/31/16  1918  10/31/16  2342  11/01/16  0350  11/01/16  0750  GLUCAP  155*  161*  145*  142*  144*  128*    Imaging Dg Chest Port 1 View  Result Date: 10/31/2016 CLINICAL DATA:  Right empyema with right upper lobe lung cancer. EXAM: PORTABLE CHEST 1 VIEW COMPARISON:  10/30/2016 FINDINGS: Right chest tubes, endotracheal tube, NG tube and central venous catheter remain in good position. No residual pneumothorax. Persistent consolidation and cavitation in the right upper lobe. Slightly increased left effusion. Small residual right effusion. IMPRESSION: Slight increase in  the left effusion since the prior exam. No other significant change. Electronically Signed   By: Lorriane Shire M.D.   On: 10/31/2016 07:58    LINES/TUBES: OETT 7.5 2/26 >>> Foley 2/26 >>> OGT 2/26 >>> Right Chest Tube x2 2/27 >>> One removed on 3/6; still has 1 CT R IJ CVL 2/26 >>> R Radial Art Line 2/27 >>> PIV    IMAGING/STUDIES: CT CHEST W/O 2/26:  Previously reviewed by me. Liver cirrhosis with a small amount of ascites. Enlarged precarinal lymph nodes. Emphysema noted predominantly apical on the left. Dense consolidation on the right with pockets and what seems to be air-fluid levels. Appears to have a pleural effusion on the right as well. TTE 2/27:Poor quality imaging. LV cavity size normal. EF 55-60% with no regional wall motion abnormalities. Unable to assess diastolic function. No obvious aortic stenosis or regurgitation with poorly visualized valve. Aortic root normal in size. No significant mitral regurgitation. Left atrium normal in size. Right ventricle and right Atrium poorly visualized. Moderate-sized pericardial effusion apically and posterior to the heart. 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  PERITONEAL FLUID 3/1:  WBC 28 (6% lymph, 56% neutrophils, 38% monocytes)   MICROBIOLOGY: HIV 1/2 2/23:  Nonreactive Urine Culture 2/23:  <10,000 insignificant growth Blood Culture x1 2/24 >>>ng MRSA PCR 2/26:  Negative  Blood Cultures x2 2/25 >>> Tracheal Aspirate Culture 2/26:  Normal flora  Respiratory Panel PCR 2/26:  Negative  Influenza A/B PCR 2/26:  Negative  Urine Streptococcal Antigen 2/26:  Negative  Urine Legionella Antigen 2/26:  Negative  Bronchial Wash Right 2/27 >>>ng Right Pleural Fluid Culture 2/27 >>>ng Peritoneal Fluid Culture 3/1  >>>ng  ANTIBIOTICS: Cefepime 2/26 (1 dose) Vancomycin 2/26 (1 dose) Tamiflu 2/26 (stopped) Azithromycin 2/26 - 3/1 Rocephin 2/26 >>>  SIGNIFICANT EVENTS: 02/26 - Admit 02/27 - VATS w/ decortication & bronchoscopy by Dr. Servando Snare 02/28 - Tube feeds held with increased abdominal distension 03/01 - Paracentesis  performed at bedside  ASSESSMENT/PLAN:  Pulmonary/ID Acute hypoxic respiratory Failure in the setting of severe CAP and post-obstructive PNA c/b right sided parapneumonic effusion/empeyma.  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  Bronchopleural fistula  Plan Cont vent support.  He is nowhere near extubation.  He is more tachypneic this am. He did poorly on PST on 3/6.  CXR stat to make sure no new acute process.  ABG acceptable. Labs are stable.  I think its agitation/sedation issues.  Plan to sedate pt >> on fentanyl/precedex/versed pushes.  CT management per TCVS.   Ct rocephin x 14d total abx Cont scheduled BDs See GOC below.    Neuro Acute encephalopathy in setting of polysubstance abuse and ETOH abuse.   Plan Cont thiamine, folic acid and nicotine patch Cont precedex gtt and fentanyl drip and pushes Cont versed prn >> may need versed drip if he remains uncomfortable on the vent.  Cont clonazepam for now. Started on 3/5.  Cont lactulose    CARDS Mod pericardial effusion w/out tamponade Hypotension- related to sedative agents. Stable.  Volume overload Plan Cont tele Low dose neo gtt Diuresce with lasix + albumin   HEME Right upper lobe NSCLCA. New dx from endobronchial Bx. No evidence of pleural involvement Anemia of critical illness Thrombocytopenia in setting of cirrhosis  Plan PET scan eventually F/u cbc Cont LMWH May need radiation if with compromise of airway vs stent. Over all prognosis is poor.  Holding off on further CA treatment until family meeting is done.    Gastrointestinal  Liver  Cirrhosis w/ ascites (likely ETOH related) Severe protein calorie malnutrition  Plan Cont tubefeeds Cont diurescing with lasix + albumin  >> will increase lasix to 40 mg q12 from 20 mg q12.  Checking lytes   RENAL Volume overload Mild hyperchloremia  Plan Diuresce with lasix and albumin  >> increase lasix to 40 q12 from 20 q12.    Over all prognosis for this pt is poor if at all he will survive this hospitalization.  I spoke with pt's brother and updated him on 3/5 and he said "do everything".  I doubt he was listening to what I was saying re: over all prognosis.  Other family members are not sure re: [place] for this pt. Plan for family meeting on Friday per family's request, 1 pm.  Will involve palliative Care as well to help [place] communicate with pt's family.   I spent  30  minutes of Critical Care time with this patient today.   Monica Becton, MD 11/01/2016, 8:52 AM [facility] Pager [phone] After 3 pm or if no answer, call [phone]

## 2016-11-01 NOTE — Plan of Care (Signed)
Problem: Education: Goal: Knowledge of [facility] Education information/materials will improve Outcome: Not Progressing Remains sedated and on the ventilator  Problem: Pain Managment: Goal: General experience of comfort will improve Outcome: Progressing Fentanyl drip for pain control  Problem: Nutrition: Goal: Adequate nutrition will be maintained Outcome: Progressing  Tube feeds at 34m/hr  Problem: Bowel/Gastric: Goal: Will not experience complications related to bowel motility Outcome: Progressing BM on shift

## 2016-11-01 NOTE — Progress Notes (Addendum)
[facility] DAILY ICU PROGRESS NOTE                   [address]            [place] [address]          [phone]   8 Days Post-Op Procedure(s) (LRB): VIDEO BRONCHOSCOPY (N/A) VIDEO ASSISTED THORACOSCOPY (VATS)/DECORTICATION AND EMPYEMA (Right)  Total Length of Stay:  LOS: 9 days   Subjective: Patient sedated on vent  Objective: Vital signs in last 24 hours: Temp:  [97.8 F (36.6 C)-99.4 F (37.4 C)] 99.4 F (37.4 C) (03/07 0700) Pulse Rate:  [65-110] 88 (03/07 0700) Cardiac Rhythm: Normal sinus rhythm (03/07 0600) Resp:  [17-27] 22 (03/07 0700) BP: (91-113)/(57-74) 99/68 (03/07 0700) SpO2:  [91 %-97 %] 95 % (03/07 0700) Arterial Line BP: (94-140)/(49-74) 104/54 (03/07 0700) FiO2 (%):  [50 %] 50 % (03/07 0700) Weight:  [177 lb 0.5 oz (80.3 kg)] 177 lb 0.5 oz (80.3 kg) (03/07 0500)  Filed Weights   10/30/16 0500 10/31/16 0500 11/01/16 0500  Weight: 166 lb 7.2 oz (75.5 kg) 172 lb 6.4 oz (78.2 kg) 177 lb 0.5 oz (80.3 kg)    Weight change: 4 lb 10.1 oz (2.1 kg)      Intake/Output from previous day: 03/06 0701 - 03/07 0700 In: 3795.5 [I.V.:1915.2; NG/GT:1630.3; IV Piggyback:250] Out: 0240 [Urine:1435; Chest Tube:1]  Intake/Output this shift: No intake/output data recorded.  Current Meds: Scheduled Meds: . cefTRIAXone (ROCEPHIN)  IV  2 g Intravenous Q0600  . chlorhexidine gluconate (MEDLINE KIT)  15 mL Mouth Rinse BID  . Chlorhexidine Gluconate Cloth  6 each Topical Daily  . clonazepam  0.5 mg Oral BID  . enoxaparin (LOVENOX) injection  40 mg Subcutaneous Q24H  . famotidine (PEPCID) IV  20 mg Intravenous Q12H  . feeding supplement (VITAL AF 1.2 CAL)  1,000 mL Per Tube Q24H  . folic acid  1 mg Intravenous Daily  . furosemide  20 mg Intravenous Q12H  . ipratropium-albuterol  3 mL Nebulization Q6H  . lactulose  30 g Per Tube BID  . mouth rinse  15 mL Mouth Rinse 10 times per day  . multivitamin  15 mL Per Tube Daily  . nicotine  14 mg Transdermal  Daily  . sodium chloride flush  10-40 mL Intracatheter Q12H  . thiamine injection  100 mg Intravenous Daily   Continuous Infusions: . dexmedetomidine 1.2 mcg/kg/hr (11/01/16 0806)  . dextrose 5 % and 0.9 % NaCl with KCl 20 mEq/L 10 mL/hr at 10/31/16 0800  . fentaNYL infusion INTRAVENOUS 75 mcg/hr (11/01/16 0700)  . lactated ringers 10 mL/hr at 10/31/16 1840  . phenylephrine (NEO-SYNEPHRINE) Adult infusion 35 mcg/min (11/01/16 0700)   PRN Meds:.sodium chloride, acetaminophen, docusate, fentaNYL (SUBLIMAZE) injection, midazolam, midazolam, ondansetron (ZOFRAN) IV, potassium chloride (KCL MULTIRUN) 30 mEq in 265 mL IVPB, sodium chloride flush  Heart: RRR Lungs: Coarse breath sounds, "guppy breathing" Extremities: SCDs in place Wound: Dressing is clean and dry Abdomen distend, tight, no bowel sounds , ascites   Lab Results: CBC:  Recent Labs  10/31/16 0338 11/01/16 0352  WBC 22.4* 25.4*  HGB 10.6* 9.4*  HCT 33.0* 29.4*  PLT 123* 135*   BMET:   Recent Labs  10/31/16 1650 11/01/16 0352  NA 144 143  K 4.2 3.9  CL 116* 115*  CO2 22 22  GLUCOSE 157* 140*  BUN 18 21*  CREATININE 0.54* 0.61  CALCIUM 8.2* 8.5*    CMET: Lab  Results  Component Value Date   WBC 25.4 (H) 11/01/2016   HGB 9.4 (L) 11/01/2016   HCT 29.4 (L) 11/01/2016   PLT 135 (L) 11/01/2016   GLUCOSE 140 (H) 11/01/2016   TRIG 40 10/29/2016   ALT 34 10/26/2016   AST 74 (H) 10/26/2016   NA 143 11/01/2016   K 3.9 11/01/2016   CL 115 (H) 11/01/2016   CREATININE 0.61 11/01/2016   BUN 21 (H) 11/01/2016   CO2 22 11/01/2016   TSH 5.250 (H) 05/15/2014   INR 1.57 10/23/2016      PT/INR: No results for input(s): LABPROT, INR in the last 72 hours. Radiology: No results found.   Assessment/Plan: S/P Procedure(s) (LRB): VIDEO BRONCHOSCOPY (N/A) VIDEO ASSISTED THORACOSCOPY (VATS)/DECORTICATION AND EMPYEMA (Right)   1. CV-On Neo synephrine drip. 2. Pulmonary-Chest tube with scant output. There is no air  leak. CXR this am shows no pneumothorax, consolidation RUL, and maybe an increase in left pleural effusion. Chest tube to remain to suction for now. On vent. Management per CCM.  4. ID-On Ceftriaxone for PNA 5. Malnutrition-on TFs 6. History of polysubstance abuse-monitor for withdrawal 7. Anemia-H and H 9.4 and 29.4.  Nani Skillern PA-C 11/01/2016 8:13 AM   Leave chest tube Case discussed with Dr Corrie Dandy, considering palative  care  Leave chest tube today I have seen and examined Irving Copas and agree with the above assessment  and plan.  Grace Isaac MD [facility] [phone] Office [phone] 11/01/2016 9:21 AM

## 2016-11-01 NOTE — Progress Notes (Addendum)
Decreased FIO2 40% due to stable sats. ETT secure. CXR reviewed

## 2016-11-02 ENCOUNTER — Inpatient Hospital Stay (HOSPITAL_COMMUNITY): Payer: Medicaid Other

## 2016-11-02 DIAGNOSIS — J90 Pleural effusion, not elsewhere classified: Secondary | ICD-10-CM

## 2016-11-02 LAB — CBC WITH DIFFERENTIAL/PLATELET
Basophils Absolute: 0 10*3/uL (ref 0.0–0.1)
Basophils Relative: 0 %
EOS PCT: 0 %
Eosinophils Absolute: 0 10*3/uL (ref 0.0–0.7)
HEMATOCRIT: 31 % — AB (ref 39.0–52.0)
HEMOGLOBIN: 9.8 g/dL — AB (ref 13.0–17.0)
Lymphocytes Relative: 6 %
Lymphs Abs: 3 10*3/uL (ref 0.7–4.0)
MCH: 30.2 pg (ref 26.0–34.0)
MCHC: 31.6 g/dL (ref 30.0–36.0)
MCV: 95.7 fL (ref 78.0–100.0)
MONO ABS: 2.5 10*3/uL — AB (ref 0.1–1.0)
MONOS PCT: 5 %
NEUTROS ABS: 45.3 10*3/uL — AB (ref 1.7–7.7)
Neutrophils Relative %: 89 %
Platelets: 165 10*3/uL (ref 150–400)
RBC: 3.24 MIL/uL — AB (ref 4.22–5.81)
RDW: 17.2 % — AB (ref 11.5–15.5)
WBC: 50.8 10*3/uL — AB (ref 4.0–10.5)

## 2016-11-02 LAB — HEPATIC FUNCTION PANEL
ALBUMIN: 1.8 g/dL — AB (ref 3.5–5.0)
ALT: 42 U/L (ref 17–63)
AST: 94 U/L — AB (ref 15–41)
Alkaline Phosphatase: 204 U/L — ABNORMAL HIGH (ref 38–126)
Bilirubin, Direct: 1.4 mg/dL — ABNORMAL HIGH (ref 0.1–0.5)
Indirect Bilirubin: 1.2 mg/dL — ABNORMAL HIGH (ref 0.3–0.9)
TOTAL PROTEIN: 6.6 g/dL (ref 6.5–8.1)
Total Bilirubin: 2.6 mg/dL — ABNORMAL HIGH (ref 0.3–1.2)

## 2016-11-02 LAB — RENAL FUNCTION PANEL
ALBUMIN: 1.9 g/dL — AB (ref 3.5–5.0)
Anion gap: 7 (ref 5–15)
BUN: 33 mg/dL — AB (ref 6–20)
CO2: 22 mmol/L (ref 22–32)
CREATININE: 1.08 mg/dL (ref 0.61–1.24)
Calcium: 8.4 mg/dL — ABNORMAL LOW (ref 8.9–10.3)
Chloride: 115 mmol/L — ABNORMAL HIGH (ref 101–111)
GFR calc Af Amer: >60 mL/min (ref >60)
GLUCOSE: 102 mg/dL — AB (ref 65–99)
PHOSPHORUS: 6.5 mg/dL — AB (ref 2.5–4.6)
Potassium: 5.3 mmol/L — ABNORMAL HIGH (ref 3.5–5.1)
Sodium: 144 mmol/L (ref 135–145)

## 2016-11-02 LAB — URINALYSIS, ROUTINE W REFLEX MICROSCOPIC
Bilirubin Urine: NEGATIVE
GLUCOSE, UA: NEGATIVE mg/dL
HGB URINE DIPSTICK: NEGATIVE
KETONES UR: NEGATIVE mg/dL
Leukocytes, UA: NEGATIVE
Nitrite: NEGATIVE
PROTEIN: NEGATIVE mg/dL
Specific Gravity, Urine: 1.019 (ref 1.005–1.030)
pH: 5 (ref 5.0–8.0)

## 2016-11-02 LAB — POCT I-STAT 3, ART BLOOD GAS (G3+)
ACID-BASE DEFICIT: 4 mmol/L — AB (ref 0.0–2.0)
Acid-base deficit: 4 mmol/L — ABNORMAL HIGH (ref 0.0–2.0)
BICARBONATE: 21 mmol/L (ref 20.0–28.0)
Bicarbonate: 22.6 mmol/L (ref 20.0–28.0)
O2 Saturation: 94 %
O2 Saturation: 94 %
PCO2 ART: 48.8 mmHg — AB (ref 32.0–48.0)
PH ART: 7.275 — AB (ref 7.350–7.450)
Patient temperature: 98.9
TCO2: 24 mmol/L (ref 0–100)
TCO2: 22 mmol/L (ref 0–100)
pCO2 arterial: 39.1 mmHg (ref 32.0–48.0)
pH, Arterial: 7.339 — ABNORMAL LOW (ref 7.350–7.450)
pO2, Arterial: 82 mmHg — ABNORMAL LOW (ref 83.0–108.0)
pO2, Arterial: 78 mmHg — ABNORMAL LOW (ref 83.0–108.0)

## 2016-11-02 LAB — GLUCOSE, CAPILLARY
GLUCOSE-CAPILLARY: 95 mg/dL (ref 65–99)
Glucose-Capillary: 113 mg/dL — ABNORMAL HIGH (ref 65–99)
Glucose-Capillary: 123 mg/dL — ABNORMAL HIGH (ref 65–99)
Glucose-Capillary: 127 mg/dL — ABNORMAL HIGH (ref 65–99)
Glucose-Capillary: 131 mg/dL — ABNORMAL HIGH (ref 65–99)
Glucose-Capillary: 149 mg/dL — ABNORMAL HIGH (ref 65–99)

## 2016-11-02 LAB — C DIFFICILE QUICK SCREEN W PCR REFLEX
C DIFFICILE (CDIFF) INTERP: NOT DETECTED
C Diff antigen: NEGATIVE
C Diff toxin: NEGATIVE

## 2016-11-02 LAB — LACTIC ACID, PLASMA: LACTIC ACID, VENOUS: 3.7 mmol/L — AB (ref 0.5–1.9)

## 2016-11-02 LAB — CORTISOL: Cortisol, Plasma: 32.1 ug/dL

## 2016-11-02 LAB — MAGNESIUM: MAGNESIUM: 2.3 mg/dL (ref 1.7–2.4)

## 2016-11-02 MED ORDER — HYDROCORTISONE NA SUCCINATE PF 100 MG IJ SOLR
50.0000 mg | Freq: Four times a day (QID) | INTRAMUSCULAR | Status: DC
Start: 2016-11-02 — End: 2016-11-03
  Administered 2016-11-02 – 2016-11-03 (×5): 50 mg via INTRAVENOUS
  Filled 2016-11-02 (×5): qty 2

## 2016-11-02 MED ORDER — SODIUM CHLORIDE 0.9 % IV BOLUS (SEPSIS): 500.0000 mL | Freq: Once | INTRAVENOUS | Status: DC

## 2016-11-02 MED ORDER — PIPERACILLIN-TAZOBACTAM 3.375 G IVPB
3.3750 g | Freq: Three times a day (TID) | INTRAVENOUS | Status: DC
  Administered 2016-11-02 – 2016-11-03 (×4): 3.375 g via INTRAVENOUS
  Filled 2016-11-02 (×5): qty 50

## 2016-11-02 MED ORDER — DEXTROSE 5 % IV SOLN
0.0000 ug/min | INTRAVENOUS | Status: DC
Start: 2016-11-02 — End: 2016-11-03
  Administered 2016-11-02: 30 ug/min via INTRAVENOUS
  Filled 2016-11-02 (×2): qty 16

## 2016-11-02 MED ORDER — VASOPRESSIN 20 UNIT/ML IV SOLN
0.0300 [IU]/min | INTRAVENOUS | Status: DC
  Administered 2016-11-02 (×2): 0.03 [IU]/min via INTRAVENOUS
  Filled 2016-11-02 (×2): qty 2

## 2016-11-02 MED ORDER — SODIUM CHLORIDE 0.9 % IV BOLUS (SEPSIS)
500.0000 mL | Freq: Once | INTRAVENOUS | Status: AC
  Administered 2016-11-02: 500 mL via INTRAVENOUS

## 2016-11-02 MED ORDER — VANCOMYCIN HCL IN DEXTROSE 750-5 MG/150ML-% IV SOLN
750.0000 mg | INTRAVENOUS | Status: DC
  Administered 2016-11-03: 750 mg via INTRAVENOUS
  Filled 2016-11-02: qty 150

## 2016-11-02 MED ORDER — VANCOMYCIN HCL 10 G IV SOLR
1250.0000 mg | Freq: Once | INTRAVENOUS | Status: AC
  Administered 2016-11-02: 1250 mg via INTRAVENOUS
  Filled 2016-11-02: qty 1250

## 2016-11-02 MED ORDER — RIFAXIMIN 550 MG PO TABS
550.0000 mg | ORAL_TABLET | Freq: Three times a day (TID) | ORAL | Status: DC
  Administered 2016-11-02 (×3): 550 mg via ORAL
  Filled 2016-11-02 (×5): qty 1

## 2016-11-02 MED ORDER — NOREPINEPHRINE BITARTRATE 1 MG/ML IV SOLN
0.0000 ug/min | INTRAVENOUS | Status: DC
  Administered 2016-11-02: 5 ug/min via INTRAVENOUS
  Administered 2016-11-02: 25 ug/min via INTRAVENOUS
  Filled 2016-11-02 (×3): qty 4

## 2016-11-02 NOTE — Progress Notes (Signed)
[facility] Progress Note Patient Name: Mario Andrews DOB: Feb 25, 1961 MRN: [id number]   Date of Service  11/02/2016  HPI/Events of Note  Needing increasing doses of neo  eICU Interventions  Change to levo gtt     Intervention Category Major Interventions: Shock - evaluation and management  Vasiliki Smaldone V. 11/02/2016, 12:58 AM

## 2016-11-02 NOTE — Progress Notes (Signed)
Upon assessment patient had tube feeding coming out of mouth, was in the subglottic tube, and in the ET tube. Tube feedings stopped and checked the placement of the OG tube. Tube was in the right spot and not coiled in the mouth. Checked a residual, pulled 800cc out and was not finished. Dr. Titus Mould notified and per MD will leave the tube feeding off and connect the OG to full suction until all contents are out and will then place it on intermittent suction. Will continue to monitor closely.

## 2016-11-02 NOTE — Progress Notes (Addendum)
[facility]  56 year old man with past medical history of cocaine and alcohol abuse with COPD and hepatitis C admitted with a postobstructive pneumonia and empyema, found to have non-small cell cancer and bronchoscopy blocking the right upper lobe bronchus, status post VATS on 2/27. Remains critically on the vent.   Subjective:  Not responding Levo now, pressors up  Objective   Temp:  [98.3 F (36.8 C)-99.3 F (37.4 C)] 99.1 F (37.3 C) (03/08 0839) Pulse Rate:  [83-107] 98 (03/08 0808) Resp:  [10-20] 20 (03/08 0808) BP: (82-115)/(51-72) 115/71 (03/08 0808) SpO2:  [83 %-100 %] 92 % (03/08 0808) Arterial Line BP: (78-111)/(45-60) 92/58 (03/08 0715) FiO2 (%):  [40 %-70 %] 70 % (03/08 0808) Weight:  [83 kg (182 lb 15.7 oz)] 83 kg (182 lb 15.7 oz) (03/08 0500) . dexmedetomidine Stopped (11/02/16 0100)  . dextrose 5 % and 0.9 % NaCl with KCl 20 mEq/L 10 mL/hr at 11/01/16 1900  . fentaNYL infusion INTRAVENOUS 50 mcg/hr (11/02/16 0725)  . lactated ringers 10 mL/hr at 11/01/16 2213  . midazolam (VERSED) infusion 1 mg/hr (11/02/16 0725)  . norepinephrine (LEVOPHED) Adult infusion 25 mcg/min (11/02/16 0600)  . phenylephrine (NEO-SYNEPHRINE) Adult infusion 45 mcg/min (11/02/16 0600)  . vasopressin (PITRESSIN) infusion - *FOR SHOCK* 0.03 Units/min (11/02/16 0359)    Intake/Output Summary (Last 24 hours) at 11/02/16 0841 Last data filed at 11/02/16 0600  Gross per 24 hour  Intake          2967.39 ml  Output              920 ml  Net          2047.39 ml    General: sedated, not responding Neuro: rass -3, not fc HEENT:jvd PULM: ronchi, CT rt water seal CV: s 1 s2 ST, no r GI: soft, BS low, no r, stool present Extremities: edema 2  CBC Recent Labs     10/31/16  0338  11/01/16  0352  11/02/16  0430  WBC  22.4*  25.4*  50.8*  HGB  10.6*  9.4*  9.8*  HCT  33.0*  29.4*  31.0*  PLT  123*  135*  165    Coag's No results for input(s): APTT, INR in the last 72  hours.  BMET Recent Labs     11/01/16  0352  11/01/16  1543  11/02/16  0430  NA  143  145  144  K  3.9  4.7  5.3*  CL  115*  116*  115*  CO2  '22  24  22  '$ BUN  21*  27*  33*  CREATININE  0.61  0.84  1.08  GLUCOSE  140*  124*  102*    Electrolytes Recent Labs     10/31/16  0338  10/31/16  1130   11/01/16  0352  11/01/16  1543  11/02/16  0430  CALCIUM  8.2*   --    < >  8.5*  8.4*  8.4*  MG  1.8  2.1   --   2.1   --   2.3  PHOS  4.0   --    --   4.2   --   6.5*   < > = values in this interval not displayed.    Sepsis Markers No results for input(s): PROCALCITON, O2SATVEN in the last 72 hours.  Invalid input(s): LACTICACIDVEN  ABG Recent Labs     11/01/16  0348  11/01/16  1325  11/02/16  0151  PHART  7.379  7.355  7.275*  PCO2ART  40.5  41.5  48.8*  PO2ART  69.6*  70.0*  82.0*    Liver Enzymes Recent Labs     11/01/16  0352  11/01/16  1543  11/02/16  0430  AST   --   95*   --   ALT   --   43   --   ALKPHOS   --   223*   --   BILITOT   --   1.3*   --   ALBUMIN  2.2*  1.9*  1.9*    Cardiac Enzymes No results for input(s): TROPONINI, PROBNP in the last 72 hours.  Glucose Recent Labs     11/01/16  1132  11/01/16  1619  11/01/16  1915  11/01/16  2348  11/02/16  0351  11/02/16  0833  GLUCAP  126*  118*  107*  99  95  113*    Imaging Dg Chest Port 1 View  Result Date: 11/02/2016 CLINICAL DATA:  Followup right upper lobe infiltrate EXAM: PORTABLE CHEST 1 VIEW COMPARISON:  11/01/2016 FINDINGS: Chronic changes in the right upper lobe are again seen. A right basilar chest tube is noted without evidence of pneumothorax. Endotracheal tube, nasogastric catheter and right jugular central line are again seen. Left basilar infiltrate is noted stable from the prior exam. Right basilar changes are noted as well. IMPRESSION: No evidence of pneumothorax. No significant interval change in bilateral airspace disease. Electronically Signed   By: Inez Catalina M.D.    On: 11/02/2016 08:17   Dg Chest Port 1 View  Result Date: 11/01/2016 CLINICAL DATA:  Postobstructive pneumonia and empyema in a patient with history of COPD. Non-small cell lung carcinoma. Chest tube removed today. EXAM: PORTABLE CHEST 1 VIEW COMPARISON:  Single-view of the chest 10/31/2016. FINDINGS: Right chest tube has been removed. Support apparatus is otherwise unchanged. No pneumothorax is identified. Dense opacity is again seen in the right upper lobe with a cavitary lesion identified. There is a small right pleural effusion and hazy airspace disease throughout the mid and lower right chest. Left mid lung zone airspace opacity is mildly improved. Heart size is normal. IMPRESSION: Negative for pneumothorax after chest tube removal. No change in airspace disease throughout the right chest worst in the right upper lobe where a cavitary lesion is identified. Some improvement in aeration in the left mid lung zone. Electronically Signed   By: Inge Rise M.D.   On: 11/01/2016 09:35    LINES/TUBES: OETT 7.5 2/26 >>> Foley 2/26 >>> OGT 2/26 >>> Right Chest Tube x2 2/27 >>> One removed on 3/6; still has 1 CT R IJ CVL 2/26 >>> R Radial Art Line 2/27 >>> PIV    IMAGING/STUDIES: CT CHEST W/O 2/26:  Previously reviewed by me. Liver cirrhosis with a small amount of ascites. Enlarged precarinal lymph nodes. Emphysema noted predominantly apical on the left. Dense consolidation on the right with pockets and what seems to be air-fluid levels. Appears to have a pleural effusion on the right as well. TTE 2/27:Poor quality imaging. LV cavity size normal. EF 55-60% with no regional wall motion abnormalities. Unable to assess diastolic function. No obvious aortic stenosis or regurgitation with poorly visualized valve. Aortic root normal in size. No significant mitral regurgitation. Left atrium normal in size. Right ventricle and right Atrium poorly visualized. Moderate-sized pericardial effusion apically and  posterior to the heart. 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  PERITONEAL FLUID 3/1:  WBC 28 (6% lymph, 56% neutrophils, 38% monocytes)   MICROBIOLOGY: HIV 1/2 2/23:  Nonreactive Urine Culture 2/23:  <10,000 insignificant growth Blood Culture x1 2/24 >>>ng MRSA PCR 2/26:  Negative  Blood Cultures x2 2/25 >>> Tracheal Aspirate Culture 2/26:  Normal flora  Respiratory Panel PCR 2/26:  Negative  Influenza A/B PCR 2/26:  Negative  Urine Streptococcal Antigen 2/26:  Negative  Urine Legionella Antigen 2/26:  Negative  Bronchial Wash Right 2/27 >>>ng Right Pleural Fluid Culture 2/27 >>>Abundent peptostreptococcus Peritoneal Fluid Culture 3/1 >>>ng cdiff 3/8>>>neg  ANTIBIOTICS: Cefepime 2/26 (1 dose) Vancomycin 2/26 (1 dose) Tamiflu 2/26 (stopped) Azithromycin 2/26 - 3/1 Rocephin 2/26 >>>3/7 Zosyn 3/8>>> vanc 3/8>>>  SIGNIFICANT EVENTS: 02/26 - Admit 02/27 - VATS w/ decortication & bronchoscopy by Dr. Servando Snare 02/28 - Tube feeds held with increased abdominal distension 03/01 - Paracentesis performed at bedside 3/8 overt septic shock worsening status  ASSESSMENT/PLAN:  Pulmonary/ID Acute hypoxic respiratory Failure in the setting of severe CAP and post-obstructive PNA c/b right sided parapneumonic effusion/empeyma.  Pulmonary edema / Volume overload New diagnosis of NSCLCA, RUL, 10/09/2016 S/p VATS on 2/27.  Bronchopleural fistula NEW VAP? 3/8  Worsening necrosis lung?  Plan ABg reviewed, rate increased , repeat pcxr in am  Ct per cvts If desat , peep to 8  Neuro Acute encephalopathy in setting of polysubstance abuse and ETOH abuse.   Plan Cont thiamine, folic acid and nicotine patch Cont precedex gtt -off  fentanyl dripm, versed needed Cont versed prn  >> may need versed drip if he remains uncomfortable on the vent.  With versed drip = clonazepam dc Cont lactulose    CARDS Mod pericardial effusion w/out tamponade, wonder if malignant Hypotension- related to sedative agents. Stable.  Volume overload NEW SEPTIC SHOCK Plan No lasix Wean neo off as high levo needed Keep vaso Cortisol then stress roids Re assess cvp Bolus once  HEME Right upper lobe NSCLCA. New dx from endobronchial Bx. 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 3/8 Plan Dc lasix Chem in am   ID PNA, r/o VAP, r/o lung necrosis Dc CTX  Continued zosyn, vanc Get UA Line clean and with worsening vent needs source unlikley line  Prognosis poor, needs comfort care  Ccm time 35 min  Lavon Paganini. Titus Mould, MD, [place] Pgr: [facility]

## 2016-11-02 NOTE — Progress Notes (Signed)
CRITICAL VALUE ALERT  Critical value received:  Lactic 3.7  Date of notification:  11/02/16  Time of notification:  7944  Critical value read back: yes  Nurse who received alert:  Henrietta Dine RN  MD notified (1st page):  Dr Titus Mould  Time of first page:  [phone]  Responding MD:  Dr Titus Mould  Time MD responded:  1016   Per MD will give another 21cc of NS. Will continue to monitor.

## 2016-11-02 NOTE — Progress Notes (Signed)
Pharmacy Antibiotic Note  Mario Andrews is a 56 y.o. male with VDRF s/p VATS, now with worsening shock/hypotension currently receiving Rocephin.  Pharmacy has been consulted for Vancomycin and Zosyn  dosing.  Plan: Vancomycin 1250 mg IV now, then 750 mg IV q24h Zosyn 3.375 g IV q8h   Height: '5\' 11"'$  (180.3 cm) Weight: 177 lb 0.5 oz (80.3 kg) IBW/kg (Calculated) : 75.3  Temp (24hrs), Avg:99 F (37.2 C), Min:98.3 F (36.8 C), Max:99.4 F (37.4 C)   Recent Labs Lab 10/29/16 0415 10/30/16 0415 10/31/16 0338 10/31/16 1650 11/01/16 0352 11/01/16 1543 11/02/16 0430  WBC 19.1* 24.6* 22.4*  --  25.4*  --  50.8*  CREATININE 0.47* 0.44* 0.46* 0.54* 0.61 0.84 1.08    Estimated Creatinine Clearance: 83.3 mL/min (by C-G formula based on SCr of 1.08 mg/dL).    Allergies  Allergen Reactions  . Peanut-Containing Drug Products Hives  . Tramadol Nausea Only    "upset stomach"    ANTIBIOTICS: Cefepime 2/26 (1 dose) Zosyn 3/8 >> Vancomycin 2/26, 3/8 >> Tamiflu 2/26 (stopped) Azithromycin 2/26 >> 3/1 Rocephin 2/26 >>3/7   Aaniya Sterba, Bronson Curb 11/02/2016 5:51 AM

## 2016-11-02 NOTE — Progress Notes (Signed)
eLink Physician-Brief Progress Note Patient Name: Mario Andrews DOB: 07-01-1961 MRN: [id number]   Date of Service  11/02/2016  HPI/Events of Note  Progressive shock resp acidosis  eICU Interventions  Increase RR to 20 Add vaso gtt     Intervention Category Major Interventions: Acid-Base disturbance - evaluation and management  ALVA,RAKESH V. 11/02/2016, 3:39 AM

## 2016-11-02 NOTE — Progress Notes (Signed)
CRITICAL VALUE ALERT  Critical value received:  WBC 50.8  Date of notification:  11/02/16  Time of notification:  6861  Critical value read back: yes  Nurse who received alert:  A. Darcel Smalling RN  MD notified (1st page):  Dr. Elsworth Soho  Time of first page:  [phone]  [facility] RN in [place] made aware of value.

## 2016-11-02 NOTE — Progress Notes (Signed)
Patient ID: Mario Andrews, male   DOB: [birth year], 56 y.o.   MRN: [id number] TCTS DAILY ICU PROGRESS NOTE                   [address]            [place] [id number]          [phone]   9 Days Post-Op Procedure(s) (LRB): VIDEO BRONCHOSCOPY (N/A) VIDEO ASSISTED THORACOSCOPY (VATS)/DECORTICATION AND EMPYEMA (Right)  Total Length of Stay:  LOS: 10 days   Subjective: Sedated on vent now on increasing pressors  Objective: Vital signs in last 24 hours: Temp:  [98.3 F (36.8 C)-99.3 F (37.4 C)] 99 F (37.2 C) (03/08 0400) Pulse Rate:  [83-107] 98 (03/08 0808) Cardiac Rhythm: Sinus tachycardia (03/08 0700) Resp:  [10-26] 20 (03/08 0808) BP: (82-115)/(51-72) 115/71 (03/08 0808) SpO2:  [83 %-100 %] 92 % (03/08 0808) Arterial Line BP: (78-111)/(45-60) 92/58 (03/08 0715) FiO2 (%):  [40 %-70 %] 70 % (03/08 0808) Weight:  [182 lb 15.7 oz (83 kg)] 182 lb 15.7 oz (83 kg) (03/08 0500)  Filed Weights   10/31/16 0500 11/01/16 0500 11/02/16 0500  Weight: 172 lb 6.4 oz (78.2 kg) 177 lb 0.5 oz (80.3 kg) 182 lb 15.7 oz (83 kg)    Weight change: 5 lb 15.2 oz (2.7 kg)   Hemodynamic parameters for last 24 hours:    Intake/Output from previous day: 03/07 0701 - 03/08 0700 In: 3089.4 [I.V.:2109.4; NG/GT:930; IV Piggyback:50] Out: 970 [Urine:730; Stool:200; Chest Tube:40]  Intake/Output this shift: No intake/output data recorded.  Current Meds: Scheduled Meds: . chlorhexidine gluconate (MEDLINE KIT)  15 mL Mouth Rinse BID  . Chlorhexidine Gluconate Cloth  6 each Topical Daily  . enoxaparin (LOVENOX) injection  40 mg Subcutaneous Q24H  . famotidine (PEPCID) IV  20 mg Intravenous Q12H  . feeding supplement (VITAL AF 1.2 CAL)  1,000 mL Per Tube Q24H  . folic acid  1 mg Intravenous Daily  . furosemide  40 mg Intravenous Q12H  . ipratropium-albuterol  3 mL Nebulization Q6H  . lactulose  30 g Per Tube BID  . mouth rinse  15 mL Mouth Rinse 10 times per day  .  multivitamin  15 mL Per Tube Daily  . nicotine  14 mg Transdermal Daily  . piperacillin-tazobactam (ZOSYN)  IV  3.375 g Intravenous Q8H  . sodium chloride flush  10-40 mL Intracatheter Q12H  . thiamine injection  100 mg Intravenous Daily  . [START ON 11-24-2016] vancomycin  750 mg Intravenous Q24H   Continuous Infusions: . dexmedetomidine Stopped (11/02/16 0100)  . dextrose 5 % and 0.9 % NaCl with KCl 20 mEq/L 10 mL/hr at 11/01/16 1900  . fentaNYL infusion INTRAVENOUS 50 mcg/hr (11/02/16 0725)  . lactated ringers 10 mL/hr at 11/01/16 2213  . midazolam (VERSED) infusion 1 mg/hr (11/02/16 0725)  . norepinephrine (LEVOPHED) Adult infusion 25 mcg/min (11/02/16 0600)  . phenylephrine (NEO-SYNEPHRINE) Adult infusion 45 mcg/min (11/02/16 0600)  . vasopressin (PITRESSIN) infusion - *FOR SHOCK* 0.03 Units/min (11/02/16 0359)   PRN Meds:.sodium chloride, acetaminophen, docusate, fentaNYL (SUBLIMAZE) injection, midazolam, midazolam, midazolam, ondansetron (ZOFRAN) IV, potassium chloride (KCL MULTIRUN) 30 mEq in 265 mL IVPB, sodium chloride flush No air leak in remaining chest tube   Lab Results: CBC: Recent Labs  11/01/16 0352 11/02/16 0430  WBC 25.4* 50.8*  HGB 9.4* 9.8*  HCT 29.4* 31.0*  PLT 135* 165   BMET:  Recent Labs  11/01/16 1543 11/02/16  0430  NA 145 144  K 4.7 5.3*  CL 116* 115*  CO2 24 22  GLUCOSE 124* 102*  BUN 27* 33*  CREATININE 0.84 1.08  CALCIUM 8.4* 8.4*    CMET: Lab Results  Component Value Date   WBC 50.8 (HH) 11/02/2016   HGB 9.8 (L) 11/02/2016   HCT 31.0 (L) 11/02/2016   PLT 165 11/02/2016   GLUCOSE 102 (H) 11/02/2016   TRIG 40 10/29/2016   ALT 43 11/01/2016   AST 95 (H) 11/01/2016   NA 144 11/02/2016   K 5.3 (H) 11/02/2016   CL 115 (H) 11/02/2016   CREATININE 1.08 11/02/2016   BUN 33 (H) 11/02/2016   CO2 22 11/02/2016   TSH 5.250 (H) 05/15/2014   INR 1.57 10/23/2016      PT/INR: No results for input(s): LABPROT, INR in the last 72  hours. Radiology: Dg Chest Port 1 View  Result Date: 11/02/2016 CLINICAL DATA:  Followup right upper lobe infiltrate EXAM: PORTABLE CHEST 1 VIEW COMPARISON:  11/01/2016 FINDINGS: Chronic changes in the right upper lobe are again seen. A right basilar chest tube is noted without evidence of pneumothorax. Endotracheal tube, nasogastric catheter and right jugular central line are again seen. Left basilar infiltrate is noted stable from the prior exam. Right basilar changes are noted as well. IMPRESSION: No evidence of pneumothorax. No significant interval change in bilateral airspace disease. Electronically Signed   By: Inez Catalina M.D.   On: 11/02/2016 08:17   Dg Chest Port 1 View  Result Date: 11/01/2016 CLINICAL DATA:  Postobstructive pneumonia and empyema in a patient with history of COPD. Non-small cell lung carcinoma. Chest tube removed today. EXAM: PORTABLE CHEST 1 VIEW COMPARISON:  Single-view of the chest 10/31/2016. FINDINGS: Right chest tube has been removed. Support apparatus is otherwise unchanged. No pneumothorax is identified. Dense opacity is again seen in the right upper lobe with a cavitary lesion identified. There is a small right pleural effusion and hazy airspace disease throughout the mid and lower right chest. Left mid lung zone airspace opacity is mildly improved. Heart size is normal. IMPRESSION: Negative for pneumothorax after chest tube removal. No change in airspace disease throughout the right chest worst in the right upper lobe where a cavitary lesion is identified. Some improvement in aeration in the left mid lung zone. Electronically Signed   By: Inge Rise M.D.   On: 11/01/2016 09:35     Assessment/Plan: S/P Procedure(s) (LRB): VIDEO BRONCHOSCOPY (N/A) VIDEO ASSISTED THORACOSCOPY (VATS)/DECORTICATION AND EMPYEMA (Right) Leave chest tube,  Require increasing pressors to combat sepsis  rx per ccm    Grace Isaac 11/02/2016 8:23 AM

## 2016-11-02 NOTE — Progress Notes (Signed)
[facility] Progress Note Patient Name: Mario Andrews DOB: [birth year] MRN: [id number]   Date of Service  11/02/2016  HPI/Events of Note  Worsening shock & WBC  eICU Interventions  Broaden to Vanc/zosyn Chk c diff (although on laxative)     Intervention Category Major Interventions: Sepsis - evaluation and management  ALVA,RAKESH V. 11/02/2016, 5:49 AM

## 2016-11-03 ENCOUNTER — Inpatient Hospital Stay (HOSPITAL_COMMUNITY): Payer: Medicaid Other

## 2016-11-03 DIAGNOSIS — R569 Unspecified convulsions: Secondary | ICD-10-CM

## 2016-11-03 DIAGNOSIS — Z515 Encounter for palliative care: Secondary | ICD-10-CM

## 2016-11-03 LAB — RENAL FUNCTION PANEL
ALBUMIN: 1.7 g/dL — AB (ref 3.5–5.0)
Anion gap: 8 (ref 5–15)
BUN: 47 mg/dL — AB (ref 6–20)
CALCIUM: 8.2 mg/dL — AB (ref 8.9–10.3)
CO2: 22 mmol/L (ref 22–32)
CREATININE: 1.15 mg/dL (ref 0.61–1.24)
Chloride: 116 mmol/L — ABNORMAL HIGH (ref 101–111)
Glucose, Bld: 135 mg/dL — ABNORMAL HIGH (ref 65–99)
PHOSPHORUS: 5.2 mg/dL — AB (ref 2.5–4.6)
Potassium: 5.2 mmol/L — ABNORMAL HIGH (ref 3.5–5.1)
Sodium: 146 mmol/L — ABNORMAL HIGH (ref 135–145)

## 2016-11-03 LAB — GLUCOSE, CAPILLARY
GLUCOSE-CAPILLARY: 132 mg/dL — AB (ref 65–99)
Glucose-Capillary: 137 mg/dL — ABNORMAL HIGH (ref 65–99)

## 2016-11-03 LAB — CBC WITH DIFFERENTIAL/PLATELET
BASOS ABS: 0 10*3/uL (ref 0.0–0.1)
Basophils Relative: 0 %
EOS ABS: 0 10*3/uL (ref 0.0–0.7)
Eosinophils Relative: 0 %
HCT: 28.2 % — ABNORMAL LOW (ref 39.0–52.0)
Hemoglobin: 9.3 g/dL — ABNORMAL LOW (ref 13.0–17.0)
LYMPHS ABS: 1.9 10*3/uL (ref 0.7–4.0)
Lymphocytes Relative: 5 %
MCH: 30.7 pg (ref 26.0–34.0)
MCHC: 33 g/dL (ref 30.0–36.0)
MCV: 93.1 fL (ref 78.0–100.0)
MONO ABS: 1.9 10*3/uL — AB (ref 0.1–1.0)
Monocytes Relative: 5 %
NEUTROS PCT: 90 %
Neutro Abs: 34.6 10*3/uL — ABNORMAL HIGH (ref 1.7–7.7)
PLATELETS: 152 10*3/uL (ref 150–400)
RBC: 3.03 MIL/uL — AB (ref 4.22–5.81)
RDW: 17.1 % — AB (ref 11.5–15.5)
WBC: 38.4 10*3/uL — AB (ref 4.0–10.5)

## 2016-11-03 LAB — MAGNESIUM: MAGNESIUM: 2.6 mg/dL — AB (ref 1.7–2.4)

## 2016-11-03 MED ORDER — GLYCOPYRROLATE 0.2 MG/ML IJ SOLN
0.6000 mg | Freq: Four times a day (QID) | INTRAMUSCULAR | Status: DC
  Administered 2016-11-03: 0.6 mg via INTRAVENOUS
  Filled 2016-11-03 (×3): qty 3

## 2016-11-03 MED ORDER — MIDAZOLAM BOLUS VIA INFUSION (WITHDRAWAL LIFE SUSTAINING TX)
5.0000 mg | INTRAVENOUS | Status: DC | PRN
Start: 2016-11-03 — End: 2016-11-03
  Administered 2016-11-03 (×2): 20 mg via INTRAVENOUS
  Filled 2016-11-03: qty 20

## 2016-11-03 MED ORDER — ONDANSETRON HCL 4 MG/2ML IJ SOLN: 4.0000 mg | Freq: Four times a day (QID) | INTRAMUSCULAR | Status: DC | PRN

## 2016-11-03 MED ORDER — ONDANSETRON 4 MG PO TBDP
4.0000 mg | ORAL_TABLET | Freq: Four times a day (QID) | ORAL | Status: DC | PRN
  Filled 2016-11-03: qty 1

## 2016-11-03 MED ORDER — GLYCOPYRROLATE 1 MG PO TABS: 1.0000 mg | ORAL_TABLET | ORAL | Status: DC | PRN

## 2016-11-03 MED ORDER — MORPHINE BOLUS VIA INFUSION
5.0000 mg | INTRAVENOUS | Status: DC | PRN
Start: 2016-11-03 — End: 2016-11-03
  Administered 2016-11-03: 10 mg via INTRAVENOUS
  Filled 2016-11-03: qty 20

## 2016-11-03 MED ORDER — SODIUM CHLORIDE 0.9 % IV SOLN
10.0000 mg/h | INTRAVENOUS | Status: DC
  Administered 2016-11-03: 15 mg/h via INTRAVENOUS
  Administered 2016-11-03: 10 mg/h via INTRAVENOUS
  Filled 2016-11-03 (×2): qty 20

## 2016-11-03 MED ORDER — PROPOFOL 1000 MG/100ML IV EMUL: 5.0000 ug/kg/min | INTRAVENOUS | Status: DC

## 2016-11-03 MED ORDER — LORAZEPAM 2 MG/ML IJ SOLN
4.0000 mg | Freq: Once | INTRAMUSCULAR | Status: AC
  Administered 2016-11-03: 4 mg via INTRAVENOUS

## 2016-11-03 MED ORDER — HALOPERIDOL LACTATE 5 MG/ML IJ SOLN: 0.5000 mg | INTRAMUSCULAR | Status: DC | PRN

## 2016-11-03 MED ORDER — POLYVINYL ALCOHOL 1.4 % OP SOLN
1.0000 [drp] | Freq: Four times a day (QID) | OPHTHALMIC | Status: DC | PRN
  Filled 2016-11-03: qty 15

## 2016-11-03 MED ORDER — GLYCOPYRROLATE 0.2 MG/ML IJ SOLN
0.6000 mg | Freq: Once | INTRAMUSCULAR | Status: AC
  Filled 2016-11-03: qty 3

## 2016-11-03 MED ORDER — ACETAMINOPHEN 650 MG RE SUPP: 650.0000 mg | Freq: Four times a day (QID) | RECTAL | Status: DC | PRN

## 2016-11-03 MED ORDER — VALPROATE SODIUM 500 MG/5ML IV SOLN
500.0000 mg | Freq: Two times a day (BID) | INTRAVENOUS | Status: DC
  Administered 2016-11-03: 500 mg via INTRAVENOUS
  Filled 2016-11-03 (×2): qty 5

## 2016-11-03 MED ORDER — PROPOFOL BOLUS VIA INFUSION
1.0000 mg/kg | Freq: Once | INTRAVENOUS | Status: DC
  Filled 2016-11-03: qty 85

## 2016-11-03 MED ORDER — LORAZEPAM 2 MG/ML IJ SOLN
4.0000 mg | INTRAMUSCULAR | Status: DC | PRN
  Administered 2016-11-03 (×3): 4 mg via INTRAVENOUS
  Filled 2016-11-03 (×3): qty 2

## 2016-11-03 MED ORDER — MORPHINE SULFATE 25 MG/ML IV SOLN
10.0000 mg/h | INTRAVENOUS | Status: DC
  Administered 2016-11-03: 10 mg/h via INTRAVENOUS
  Filled 2016-11-03: qty 10

## 2016-11-03 MED ORDER — LORAZEPAM 2 MG/ML IJ SOLN
4.0000 mg | INTRAMUSCULAR | Status: DC | PRN
  Administered 2016-11-03 (×2): 4 mg via INTRAVENOUS
  Filled 2016-11-03 (×2): qty 2

## 2016-11-03 MED ORDER — HALOPERIDOL 0.5 MG PO TABS: 0.5000 mg | ORAL_TABLET | ORAL | Status: DC | PRN

## 2016-11-03 MED ORDER — PHENOBARBITAL SODIUM 65 MG/ML IJ SOLN
INTRAMUSCULAR | Status: DC
  Administered 2016-11-03: 13:00:00 via INTRAVENOUS
  Filled 2016-11-03 (×45): qty 100

## 2016-11-03 MED ORDER — SODIUM CHLORIDE 0.9 % IV SOLN
500.0000 mg | Freq: Two times a day (BID) | INTRAVENOUS | Status: DC
  Administered 2016-11-03: 500 mg via INTRAVENOUS
  Filled 2016-11-03 (×2): qty 5

## 2016-11-03 MED ORDER — BIOTENE DRY MOUTH MT LIQD: 15.0000 mL | OROMUCOSAL | Status: DC | PRN

## 2016-11-03 MED ORDER — GLYCOPYRROLATE 0.2 MG/ML IJ SOLN
0.2000 mg | INTRAMUSCULAR | Status: DC | PRN
  Filled 2016-11-03: qty 1

## 2016-11-03 MED ORDER — HALOPERIDOL LACTATE 2 MG/ML PO CONC
0.5000 mg | ORAL | Status: DC | PRN
Start: 2016-11-03 — End: 2016-11-03
  Administered 2016-11-03: 0.5 mg via SUBLINGUAL
  Filled 2016-11-03 (×2): qty 0.3

## 2016-11-03 MED ORDER — LORAZEPAM 2 MG/ML IJ SOLN
INTRAMUSCULAR | Status: AC
  Administered 2016-11-03: 4 mg via INTRAVENOUS
  Filled 2016-11-03: qty 1

## 2016-11-03 MED ORDER — SODIUM CHLORIDE 0.9 % IV SOLN
10.0000 mg/h | INTRAVENOUS | Status: DC
  Filled 2016-11-03: qty 10
  Filled 2016-11-03: qty 5

## 2016-11-03 MED ORDER — ACETAMINOPHEN 325 MG PO TABS: 650.0000 mg | ORAL_TABLET | Freq: Four times a day (QID) | ORAL | Status: DC | PRN

## 2016-11-03 MED ORDER — PROPOFOL 10 MG/ML IV BOLUS: 20.0000 mg | Freq: Once | INTRAVENOUS | Status: DC

## 2016-11-03 MED ORDER — PROPOFOL 1000 MG/100ML IV EMUL
INTRAVENOUS | Status: AC
  Administered 2016-11-03: 20 ug/kg/min
  Filled 2016-11-03: qty 100

## 2016-11-03 MED ORDER — FENTANYL CITRATE (PF) 100 MCG/2ML IJ SOLN: 100.0000 ug | INTRAMUSCULAR | Status: DC | PRN

## 2016-11-03 MED ORDER — MIDAZOLAM HCL 5 MG/ML IJ SOLN
10.0000 mg/h | INTRAMUSCULAR | Status: DC
  Administered 2016-11-03: 10 mg/h via INTRAVENOUS
  Filled 2016-11-03: qty 10

## 2016-11-08 ENCOUNTER — Telehealth: Payer: Self-pay

## 2016-11-08 NOTE — Telephone Encounter (Signed)
On 11/08/16 I received a death certificate from [facility] (original). The death certificate is for cremation. The patient is a patient of Doctor Titus Mould. The death certificate will be taken to [facility]) this am for signature.  On 11-16-16 I received the death certificate back from Doctor Titus Mould. I got the death certificate ready and called the funeral home to let them know the death certificate is ready for pickup. I also faxed a copy to the funeral home per the funeral home request.

## 2016-11-22 LAB — FUNGUS CULTURE WITH STAIN

## 2016-11-22 LAB — FUNGUS CULTURE RESULT

## 2016-11-22 LAB — FUNGAL ORGANISM REFLEX

## 2016-11-26 NOTE — Consult Note (Signed)
Consultation Note Date: 12/02/16   Patient Name: Mario Andrews  DOB: [birth year]  MRN: [id number]  Age / Sex: 56 y.o., male  PCP: Provider Not In System Referring Physician: Juanito Doom, MD  Reason for Consultation: Psychosocial/spiritual support, Terminal Care and Withdrawal of life-sustaining treatment  HPI/Patient Profile: 55 y.o. male  with past medical history of cocaine and alcohol abuse, hep C, cirrhosis, and stroke who was admitted on 10/01/2016 with acute respiratory failure secondary. He required ventilator support at the time of admission.  He under went VATS on 2/27 and was found to have an empyema and non small cell lung cancer in addition to post obstructive pneumonia.  He required chest tube placement.  He continued to decline requiring pressor support, unable to accept tube feeds, and on 3/9 seizing.  Dr. Titus Mould spoke at length with the brother Annie Main) this morning and explained to him that Mario Andrews is dying.  He has been transitioned to comfort care.  Clinical Assessment and Goals of Care: I met with CCM and the bedside RN prior to meeting with the family.  Medications were adjusted.  Extended family was present at beside.  Comfort care and ventilator withdraw was explained.  We discussed what to expect at end of life.  The family was advised that death could happen quickly or could take days.  If Mario Andrews survives and stabilizes we will transfer him to 6N.  The family is comfortable with this plan.  As more family members arrived the conversation was repeated.   Family members were made comfortable with ice water, coffee and tissues.    Mario Andrews continued to have seizures.  After discussing the situation with my attending physician, phenobarbital was ordered.  Mario Andrews continued to have refractory seizures.  Propofol was added for comfort just prior to extubation.  I remained with the  family until all questions were answered and the patient passed at approximately 2:27 pm.  Primary Decision Maker:  NEXT OF KIN brother Annie Main.    SUMMARY OF RECOMMENDATIONS    Code Status/Advance Care Planning:  DNR    Symptom Management:   Morphine for pain / dyspnea  Lorazepam for anxiety  Keppra and Phenobarbital for seizures.  Robinul for secretions.  Propofol refractory agitation and seizures   Psycho-social/Spiritual:   Desire for further Chaplaincy support: the family's pastor is present.  Prognosis:   Hours - Days  Discharge Planning: Anticipated Hospital Death      Primary Diagnoses: Present on Admission: . Community acquired pneumonia of right upper lobe of lung ([place])   I have reviewed the medical record, interviewed the patient and family, and examined the patient. The following aspects are pertinent.  Past Medical History:  Diagnosis Date  . Alcohol abuse   . Anxiety   . Cocaine abuse   . COPD (chronic obstructive pulmonary disease) ([place])   . Depression   . Hepatitis C   . Seizures ([place])    last seizure 2 months ago  . Stroke [facility])  2009   Social History   Social History  . Marital status: Single    Spouse name: N/A  . Number of children: N/A  . Years of education: N/A   Social History Main Topics  . Smoking status: Current Every Day Smoker    Packs/day: 0.50    Years: 18.00    Types: Cigarettes  . Smokeless tobacco: Never Used  . Alcohol use Yes     Comment: pt states 2 quarts daily  . Drug use: No     Comment: former  . Sexual activity: Yes    Birth control/ protection: Condom   Other Topics Concern  . Not on file   Social History Narrative  . No narrative on file   Family History  Problem Relation Age of Onset  . Cancer Mother   . Stroke Father   . Cancer Father    Scheduled Meds: . chlorhexidine gluconate (MEDLINE KIT)  15 mL Mouth Rinse BID  . Chlorhexidine Gluconate Cloth  6 each Topical Daily  .  enoxaparin (LOVENOX) injection  40 mg Subcutaneous Q24H  . famotidine (PEPCID) IV  20 mg Intravenous Q12H  . feeding supplement (VITAL AF 1.2 CAL)  1,000 mL Per Tube Q24H  . folic acid  1 mg Intravenous Daily  . hydrocortisone sodium succinate  50 mg Intravenous Q6H  . ipratropium-albuterol  3 mL Nebulization Q6H  . lactulose  30 g Per Tube BID  . levETIRAcetam  500 mg Intravenous Q12H  . mouth rinse  15 mL Mouth Rinse 10 times per day  . multivitamin  15 mL Per Tube Daily  . nicotine  14 mg Transdermal Daily  . piperacillin-tazobactam (ZOSYN)  IV  3.375 g Intravenous Q8H  . rifaximin  550 mg Oral TID  . sodium chloride  500 mL Intravenous Once  . sodium chloride flush  10-40 mL Intracatheter Q12H  . thiamine injection  100 mg Intravenous Daily  . valproate sodium  500 mg Intravenous Q12H  . vancomycin  750 mg Intravenous Q24H   Continuous Infusions: . dextrose 5 % and 0.9 % NaCl with KCl 20 mEq/L 10 mL/hr at 2016/11/22 0900  . fentaNYL infusion INTRAVENOUS 100 mcg/hr (11-22-16 0900)  . lactated ringers 10 mL/hr at November 22, 2016 0900  . midazolam (VERSED) infusion 15 mg/hr (11-22-2016 1203)  . morphine 10 mg/hr (2016/11/22 0945)  . norepinephrine (LEVOPHED) Adult infusion Stopped (11-22-16 0800)  . vasopressin (PITRESSIN) infusion - *FOR SHOCK* 0.03 Units/min (11-22-2016 0900)   PRN Meds:.sodium chloride, acetaminophen, docusate, fentaNYL (SUBLIMAZE) injection, LORazepam, midazolam, midazolam, midazolam, midazolam, morphine, ondansetron (ZOFRAN) IV, potassium chloride (KCL MULTIRUN) 30 mEq in 265 mL IVPB, sodium chloride flush Allergies  Allergen Reactions  . Peanut-Containing Drug Products Hives  . Tramadol Nausea Only    "upset stomach"   Review of Systems patient intubated.  Physical Exam  Elderly chronically ill appearing male, intubated with jerking movements of his jaw and left arm Abdomen distended and tight Lower extremities with 2-3+ edema in prevlon boots.  Vital Signs: BP  130/73   Pulse (!) 128   Temp 97.5 F (36.4 C) (Oral)   Resp 15   Ht '5\' 11"'  (1.803 m)   Wt 84.6 kg (186 lb 8.2 oz)   SpO2 97%   BMI 26.01 kg/m  Pain Assessment: CPOT   Pain Score: Asleep   SpO2: SpO2: 97 % O2 Device:SpO2: 97 % O2 Flow Rate: .   IO: Intake/output summary:  Intake/Output Summary (Last 24 hours) at 22-Nov-2016 1219  Last data filed at 2016-11-26 0900  Gross per 24 hour  Intake          1436.68 ml  Output             2655 ml  Net         -1218.32 ml    LBM: Last BM Date: 11/26/16 Baseline Weight: Weight: 65.8 kg (145 lb) Most recent weight: Weight: 84.6 kg (186 lb 8.2 oz)     Palliative Assessment/Data:   Flowsheet Rows   Flowsheet Row Most Recent Value  Intake Tab  Referral Department  Critical care  Unit at Time of Referral  ICU  Palliative Care Primary Diagnosis  Cancer  Date Notified  11/01/16  Palliative Care Type  New Palliative care  Reason for referral  Clarify Goals of Care  Date of Admission  10/21/2016  Date first seen by Palliative Care  2016/11/26  # of days Palliative referral response time  2 Day(s)  # of days IP prior to Palliative referral  10  Clinical Assessment  Psychosocial & Spiritual Assessment  Palliative Care Outcomes      Time In: 12:30 Time Out:  2:30 Time Total: 120 min. Greater than 50%  of this time was spent counseling and coordinating care related to the above assessment and plan.  Signed by: Imogene Burn, PA-C Palliative Medicine Pager: [phone]  Please contact Palliative Medicine Team phone at [phone] for questions and concerns.  For individual provider: See Shea Evans

## 2016-11-26 NOTE — Progress Notes (Signed)
  Dg Chest Port 1 View  Result Date: 11/02/2016 CLINICAL DATA:  Followup right upper lobe infiltrate EXAM: PORTABLE CHEST 1 VIEW COMPARISON:  11/01/2016 FINDINGS: Chronic changes in the right upper lobe are again seen. A right basilar chest tube is noted without evidence of pneumothorax. Endotracheal tube, nasogastric catheter and right jugular central line are again seen. Left basilar infiltrate is noted stable from the prior exam. Right basilar changes are noted as well. IMPRESSION: No evidence of pneumothorax. No significant interval change in bilateral airspace disease. Electronically Signed   By: Inez Catalina M.D.   On: 11/02/2016 08:17    rx per ccm, now on pressors and vent Would leave chest tube in place while on vent Wounds ok with out evidence of infection  Grace Isaac MD      [address] [place] [address] Office [phone]   [place]

## 2016-11-26 NOTE — Progress Notes (Signed)
   2016/11/23 1108  Clinical Encounter Type  Visited With Patient and family together  Visit Type Initial;Patient actively dying  Referral From Nurse  Consult/Referral To Chaplain  Recommendations (follow up if needed)  Spiritual Encounters  Spiritual Needs Prayer;Emotional;Grief support  Stress Factors  Patient Stress Factors Major life changes;Other (Comment) (end of life, comfort care)  pt. Actively dying,in comfort care.  Brother is by bedside, emotional and anticipatory grief.  A family pastor is coming by later today to offer a support, as well as some other siblings will be visiting.  Chaplain offered a prayer, emotional support, grief support,  active listening, ministry of presence.  Advise the family to contact Pastoral Care services if need further assistance.  Chaplain Heidie Krall A. Jalan Bodi ,  MA-PC , BA-REL/PHIL, [phone]

## 2016-11-26 NOTE — Progress Notes (Signed)
[facility]  56 year old man with past medical history of cocaine and alcohol abuse with COPD and hepatitis C admitted with a postobstructive pneumonia and empyema, found to have non-small cell cancer and bronchoscopy blocking the right upper lobe bronchus, status post VATS on 2/27. Remains critically on the vent.   Subjective:  Not responding Facial and neck twitching, likely seizures this am  Pressors improved No fevers  Objective   Temp:  [97.3 F (36.3 C)-97.8 F (36.6 C)] 97.5 F (36.4 C) (03/09 0808) Pulse Rate:  [100-133] 121 (03/09 0820) Resp:  [13-29] 20 (03/09 0820) BP: (95-131)/(56-85) 120/74 (03/09 0820) SpO2:  [91 %-97 %] 95 % (03/09 0820) Arterial Line BP: (81-118)/(48-67) 105/57 (03/09 0600) FiO2 (%):  [70 %-100 %] 100 % (03/09 0820) Weight:  [84.6 kg (186 lb 8.2 oz)] 84.6 kg (186 lb 8.2 oz) (03/09 0500) . dextrose 5 % and 0.9 % NaCl with KCl 20 mEq/L 10 mL/hr at 11/13/2016 0900  . fentaNYL infusion INTRAVENOUS 100 mcg/hr (Nov 13, 2016 0900)  . lactated ringers 10 mL/hr at Nov 13, 2016 0900  . midazolam (VERSED) infusion 9 mg/hr (13-Nov-2016 0900)  . norepinephrine (LEVOPHED) Adult infusion Stopped (11-13-16 0800)  . vasopressin (PITRESSIN) infusion - *FOR SHOCK* 0.03 Units/min (2016-11-13 0900)    Intake/Output Summary (Last 24 hours) at 13-Nov-2016 0914 Last data filed at 13-Nov-2016 0900  Gross per 24 hour  Intake          2102.17 ml  Output             2765 ml  Net          -662.83 ml    General: sedated, not responding, twitching face neck Neuro: rass -4 HEENT:jvd, thin PULM: ronchi, CT rt water seal CV: s 1 s2 ST, no r GI: soft, BS low, no r Extremities: edema 2  CBC Recent Labs     11/01/16  0352  11/02/16  0430  11/13/16  0500  WBC  25.4*  50.8*  38.4*  HGB  9.4*  9.8*  9.3*  HCT  29.4*  31.0*  28.2*  PLT  135*  165  152    Coag's No results for input(s): APTT, INR in the last 72 hours.  BMET Recent Labs     11/01/16  1543   11/02/16  0430  11-13-2016  0500  NA  145  144  146*  K  4.7  5.3*  5.2*  CL  116*  115*  116*  CO2  '24  22  22  '$ BUN  27*  33*  47*  CREATININE  0.84  1.08  1.15  GLUCOSE  124*  102*  135*    Electrolytes Recent Labs     11/01/16  0352  11/01/16  1543  11/02/16  0430  11-13-2016  0500  CALCIUM  8.5*  8.4*  8.4*  8.2*  MG  2.1   --   2.3  2.6*  PHOS  4.2   --   6.5*  5.2*    Sepsis Markers No results for input(s): PROCALCITON, O2SATVEN in the last 72 hours.  Invalid input(s): LACTICACIDVEN  ABG Recent Labs     11/01/16  1325  11/02/16  0151  11/02/16  0926  PHART  7.355  7.275*  7.339*  PCO2ART  41.5  48.8*  39.1  PO2ART  70.0*  82.0*  78.0*    Liver Enzymes Recent Labs     11/01/16  1543  11/02/16  0430  11/02/16  0913  11-28-16  0500  AST  95*   --   94*   --   ALT  43   --   42   --   ALKPHOS  223*   --   204*   --   BILITOT  1.3*   --   2.6*   --   ALBUMIN  1.9*  1.9*  1.8*  1.7*    Cardiac Enzymes No results for input(s): TROPONINI, PROBNP in the last 72 hours.  Glucose Recent Labs     11/02/16  1228  11/02/16  1545  11/02/16  1925  11/02/16  2348  11-28-2016  0342  11/28/2016  0804  GLUCAP  149*  131*  123*  127*  132*  137*    Imaging Dg Chest Port 1 View  Result Date: 11/02/2016 CLINICAL DATA:  Followup right upper lobe infiltrate EXAM: PORTABLE CHEST 1 VIEW COMPARISON:  11/01/2016 FINDINGS: Chronic changes in the right upper lobe are again seen. A right basilar chest tube is noted without evidence of pneumothorax. Endotracheal tube, nasogastric catheter and right jugular central line are again seen. Left basilar infiltrate is noted stable from the prior exam. Right basilar changes are noted as well. IMPRESSION: No evidence of pneumothorax. No significant interval change in bilateral airspace disease. Electronically Signed   By: Inez Catalina M.D.   On: 11/02/2016 08:17   Dg Chest Port 1 View  Result Date: 11/01/2016 CLINICAL DATA:   Postobstructive pneumonia and empyema in a patient with history of COPD. Non-small cell lung carcinoma. Chest tube removed today. EXAM: PORTABLE CHEST 1 VIEW COMPARISON:  Single-view of the chest 10/31/2016. FINDINGS: Right chest tube has been removed. Support apparatus is otherwise unchanged. No pneumothorax is identified. Dense opacity is again seen in the right upper lobe with a cavitary lesion identified. There is a small right pleural effusion and hazy airspace disease throughout the mid and lower right chest. Left mid lung zone airspace opacity is mildly improved. Heart size is normal. IMPRESSION: Negative for pneumothorax after chest tube removal. No change in airspace disease throughout the right chest worst in the right upper lobe where a cavitary lesion is identified. Some improvement in aeration in the left mid lung zone. Electronically Signed   By: Inge Rise M.D.   On: 11/01/2016 09:35    LINES/TUBES: OETT 7.5 2/26 >>> Foley 2/26 >>> OGT 2/26 >>> Right Chest Tube x2 2/27 >>> One removed on 3/6; still has 1 CT R IJ CVL 2/26 >>> R Radial Art Line 2/27 >>> PIV    IMAGING/STUDIES: CT CHEST W/O 2/26:  Previously reviewed by me. Liver cirrhosis with a small amount of ascites. Enlarged precarinal lymph nodes. Emphysema noted predominantly apical on the left. Dense consolidation on the right with pockets and what seems to be air-fluid levels. Appears to have a pleural effusion on the right as well. TTE 2/27:Poor quality imaging. LV cavity size normal. EF 55-60% with no regional wall motion abnormalities. Unable to assess diastolic function. No obvious aortic stenosis or regurgitation with poorly visualized valve. Aortic root normal in size. No significant mitral regurgitation. Left atrium normal in size. Right ventricle and right Atrium poorly visualized. Moderate-sized pericardial effusion apically and posterior to the heart. RIGHT PLEURAL FLUID 2/27:  LDH 2298 / WBC 28,000 (98%  neutrophils & 2% monocytes) / Cytology negative RUL ENDOBRONCHIAL BIOPSIES 2/27: Non-small cell lung cancer (favor squamous cell carcinoma) RIGHT PLEURAL PEAL PATHOLOGY 2/27:  No evidence of  malignancy.  PERITONEAL FLUID 3/1:  WBC 28 (6% lymph, 56% neutrophils, 38% monocytes)   MICROBIOLOGY: HIV 1/2 2/23:  Nonreactive Urine Culture 2/23:  <10,000 insignificant growth Blood Culture x1 2/24 >>>ng MRSA PCR 2/26:  Negative  Blood Cultures x2 2/25 >>> Tracheal Aspirate Culture 2/26:  Normal flora  Respiratory Panel PCR 2/26:  Negative  Influenza A/B PCR 2/26:  Negative  Urine Streptococcal Antigen 2/26:  Negative  Urine Legionella Antigen 2/26:  Negative  Bronchial Wash Right 2/27 >>>ng Right Pleural Fluid Culture 2/27 >>>Abundent peptostreptococcus Peritoneal Fluid Culture 3/1 >>>ng cdiff 3/8>>>neg  ANTIBIOTICS: Cefepime 2/26 (1 dose) Vancomycin 2/26 (1 dose) Tamiflu 2/26 (stopped) Azithromycin 2/26 - 3/1 Rocephin 2/26 >>>3/7 Zosyn 3/8>>> vanc 3/8>>>  SIGNIFICANT EVENTS: 02/26 - Admit 02/27 - VATS w/ decortication & bronchoscopy by Dr. Servando Snare 02/28 - Tube feeds held with increased abdominal distension 03/01 - Paracentesis performed at bedside 3/8 overt septic shock worsening status  ASSESSMENT/PLAN:  Pulmonary/ID Acute hypoxic respiratory Failure in the setting of severe CAP and post-obstructive PNA c/b right sided parapneumonic effusion/empeyma.  Pulmonary edema / Volume overload New diagnosis of NSCLCA, RUL, 10/16/2016 S/p VATS on 2/27.  Bronchopleural fistula NEW VAP? 3/8  Worsening necrosis lung?  Plan New neuro changes, no sbt Chest tube per cvts Considering comfort care, will need to assess comfort needs, assess short assessments   Neuro Acute encephalopathy in setting of polysubstance abuse and ETOH abuse.   Plan Comfort care Versed for seizures keppra May need higher dose ativan additional Morphine, dc fent Titrate rr 12-18 Prognosis poor, needs  comfort care  I have had extensive discussions with family brother ( he states NO other family members can decide as they are all under influence and poor judgmenet. We discussed patients current circumstances and organ failures. We also discussed patient's prior wishes under circumstances such as this. Family has decided to NOT perform resuscitation if arrest and full comfort at 2-4 pm D/w palliative care   Ccm time 35 min  Lavon Paganini. Titus Mould, MD, [place] Pgr: [facility]

## 2016-11-26 NOTE — Progress Notes (Signed)
Nutrition Follow Up/Brief Note  Chart reviewed. Pt now transitioning to comfort care.  No further nutrition interventions warranted at this time.  Please re-consult as needed.   Katie Kagan Mutchler, RD, LDN Pager #: [phone] After-Hours Pager #: [phone]    

## 2016-11-26 NOTE — Progress Notes (Addendum)
Wasted in sink with Hyu Megan Hayduk Morphine 44m Versed 1031mPhenobarbital 2054m

## 2016-11-26 NOTE — Discharge Summary (Signed)
NAME:  Mario Andrews, Mario Andrews NO.:  [id number]  MEDICAL RECORD NO.:  [id number]  LOCATION:                                FACILITY:  [facility]  PHYSICIAN:  Raylene Miyamoto, MD DATE OF BIRTH:  03-07-1961  DATE OF ADMISSION:  10/21/2016 DATE OF DISCHARGE:  11-12-16                              DISCHARGE SUMMARY   DEATH SUMMARY  This is a 56 year old man with past medical history of cocaine and alcohol abuse with COPD, hepatitis C, admitted with postobstructive pneumonia and empyema, found to have non-small cell lung cancer, and bronchoscopically blocking the right upper lobe bronchus, status post VATS on October 24, 2016, who had a hospital course significant for the VATS procedure, decortication, and bronchoscopy by CVTS doctors.  On October 25, 2016, the tube feeds were held secondary to increased abdominal distention.  On October 26, 2016, the patient required a paracentesis.  On November 02, 2016, the patient had worsening overt septic shock and worsening status in the setting of multiorgan failure and new diagnosis of cancer with appearing to be a new possible ventilator associated pneumonia on November 02, 2016, with worsening or necrotizing lung infection with multiorgan failure from this with seizures and worsening neurologic status as well.  With this cancer and multiorgan failure, his quality of life, abilities, and survival was very low and family opted for comfort care.  Comfort care was provided.  The patient expired.  FINAL DIAGNOSES UPON DEATH: 1. Acute hypoxic respiratory failure secondary to postobstructive     pneumonia from cancer. 2. Parapneumonic effusion/empyema. 3. Pulmonary edema. 4. New diagnosis of non-small cell lung cancer, right upper lobe on     October 24, 2016. 5. Status post video-assisted thoracoscopic surgery on October 24, 2016. 6. Bronchopleural fistula. 7. Ventilator associated pneumonia on November 02, 2016. 8. Septic shock and  multiple organ dysfunction syndrome secondary to     new likely necrotizing lung infection.     Raylene Miyamoto, MD     DJF/MEDQ  D:  11/13/2016  T:  11/13/2016  Job:  [id number]

## 2016-11-26 NOTE — Progress Notes (Signed)
Patient was extubated per "withdrawal of life" protocol.  Patient tolerated well.  No complications noted.

## 2016-11-26 NOTE — Care Management (Signed)
Pt has been transitioned to full comfort care.  No CM needs identified

## 2016-11-26 NOTE — Progress Notes (Signed)
Called Titus Mould to report seizure activity in patient. Verbal orders given

## 2016-11-26 NOTE — Progress Notes (Signed)
Time of death 50. At bedside with Patria Mane PA, pallative care

## 2016-11-26 DEATH — deceased

## 2016-12-06 LAB — ACID FAST CULTURE WITH REFLEXED SENSITIVITIES (MYCOBACTERIA): Acid Fast Culture: NEGATIVE

## 2016-12-07 LAB — ACID FAST CULTURE WITH REFLEXED SENSITIVITIES (MYCOBACTERIA): Acid Fast Culture: NEGATIVE

## 2017-01-16 DIAGNOSIS — R569 Unspecified convulsions: Secondary | ICD-10-CM

## 2017-01-16 DIAGNOSIS — Z515 Encounter for palliative care: Secondary | ICD-10-CM

## 2017-01-26 NOTE — Addendum Note (Signed)
Addendum  created 01/26/17 1206 by Rica Koyanagi, MD   Sign clinical note

## 2018-02-18 IMAGING — DX DG CHEST 1V PORT
1 series · 1 of 1 positions shown · non-contrast
Comparison: Chest x-ray dated 10/24/2016 at [DATE] a.m.

CLINICAL DATA: Empyema. Obstructing carcinoma of the right upper
lobe bronchus.

EXAM:
PORTABLE CHEST 1 VIEW [DATE] a.m.

[chest ap]
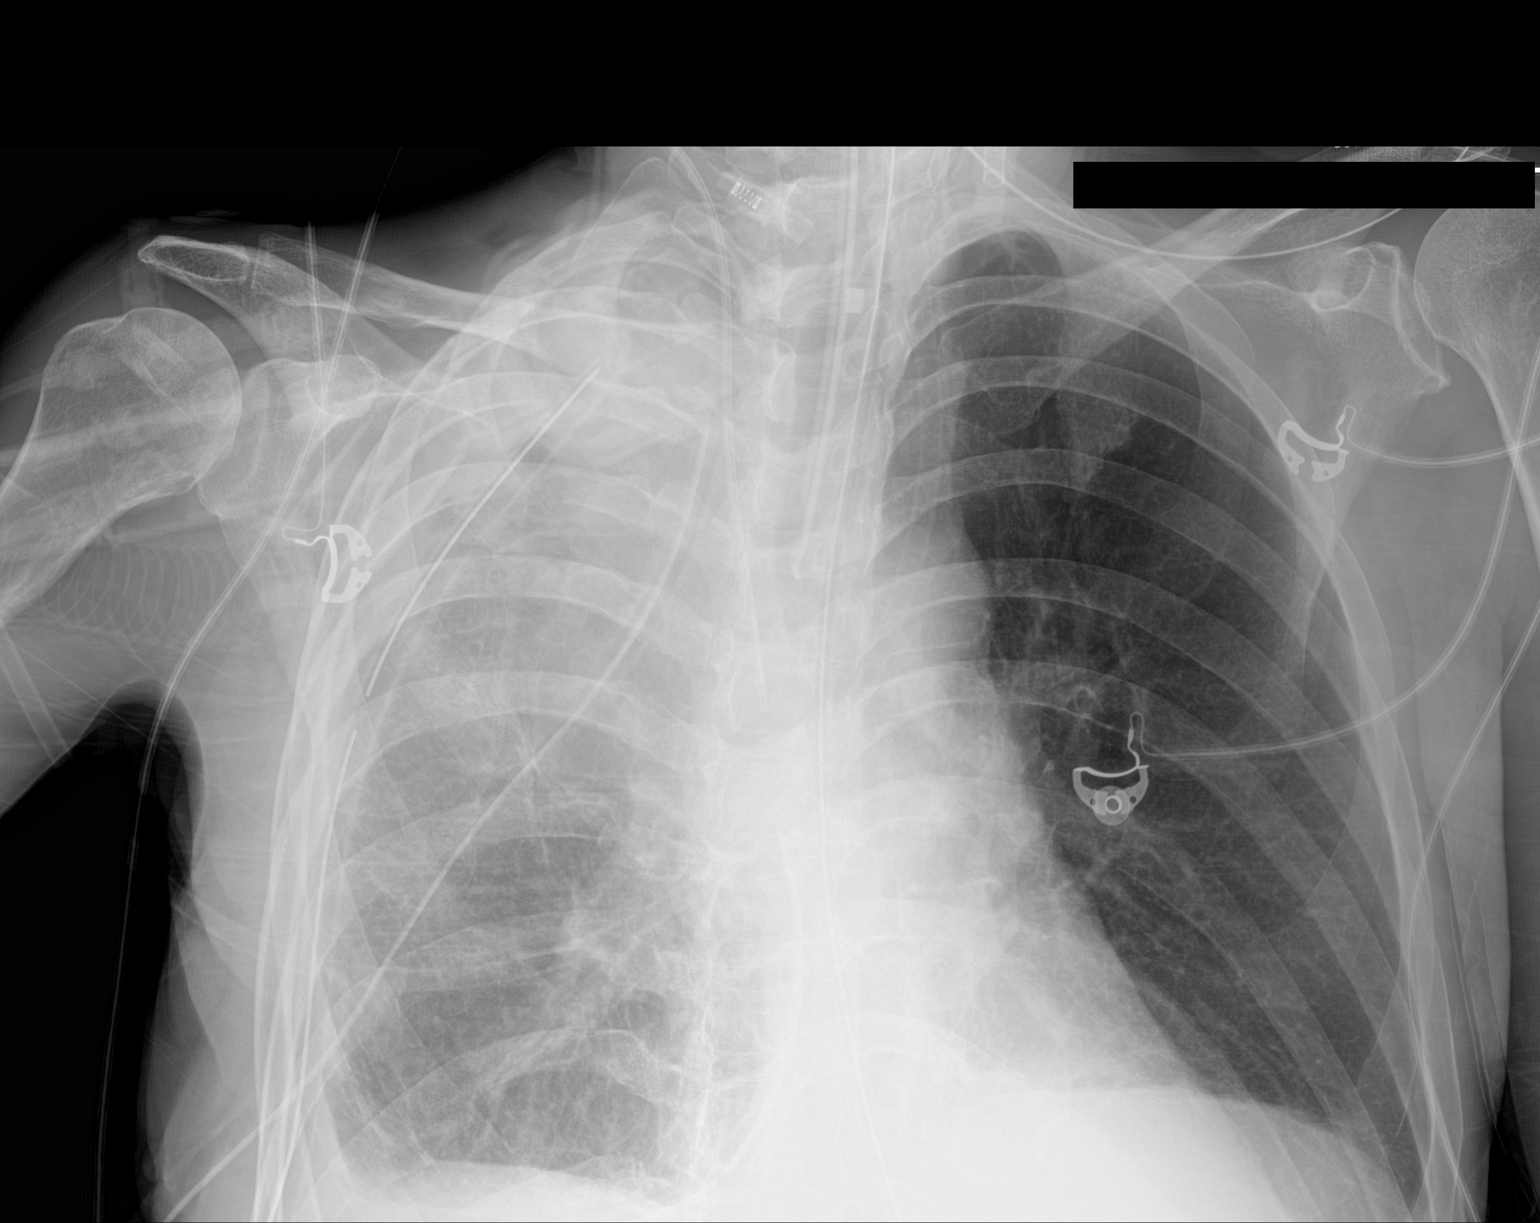

[1 of 1 positions shown; findings below may reference images not displayed]

FINDINGS: Endotracheal tube, central line and NG tube appear in good position.
The patient has undergone interval video-assisted thoracoscopy,
decortication and drainage of right empyema.

There are 2 new right chest tubes in place. Extra is re-expansion of
the right lower lobe. Persistent consolidation in the right upper
lobe.

Left lung is clear.  Heart size is normal.

No pneumothorax.
IMPRESSION: Re-expansion of the right lower lobe with no residual pneumothorax.
No residual visible empyema. Consolidation of the right upper lobe
remains. New diagnosis of carcinoma of the right upper lobe
bronchus.

## 2018-02-19 IMAGING — CR DG CHEST 1V PORT
1 series · 1 of 1 positions shown · non-contrast
Comparison: 10/24/2016.

CLINICAL DATA: Chest tube .  Prior thoracotomy.

EXAM:
PORTABLE CHEST 1 VIEW

[AP]
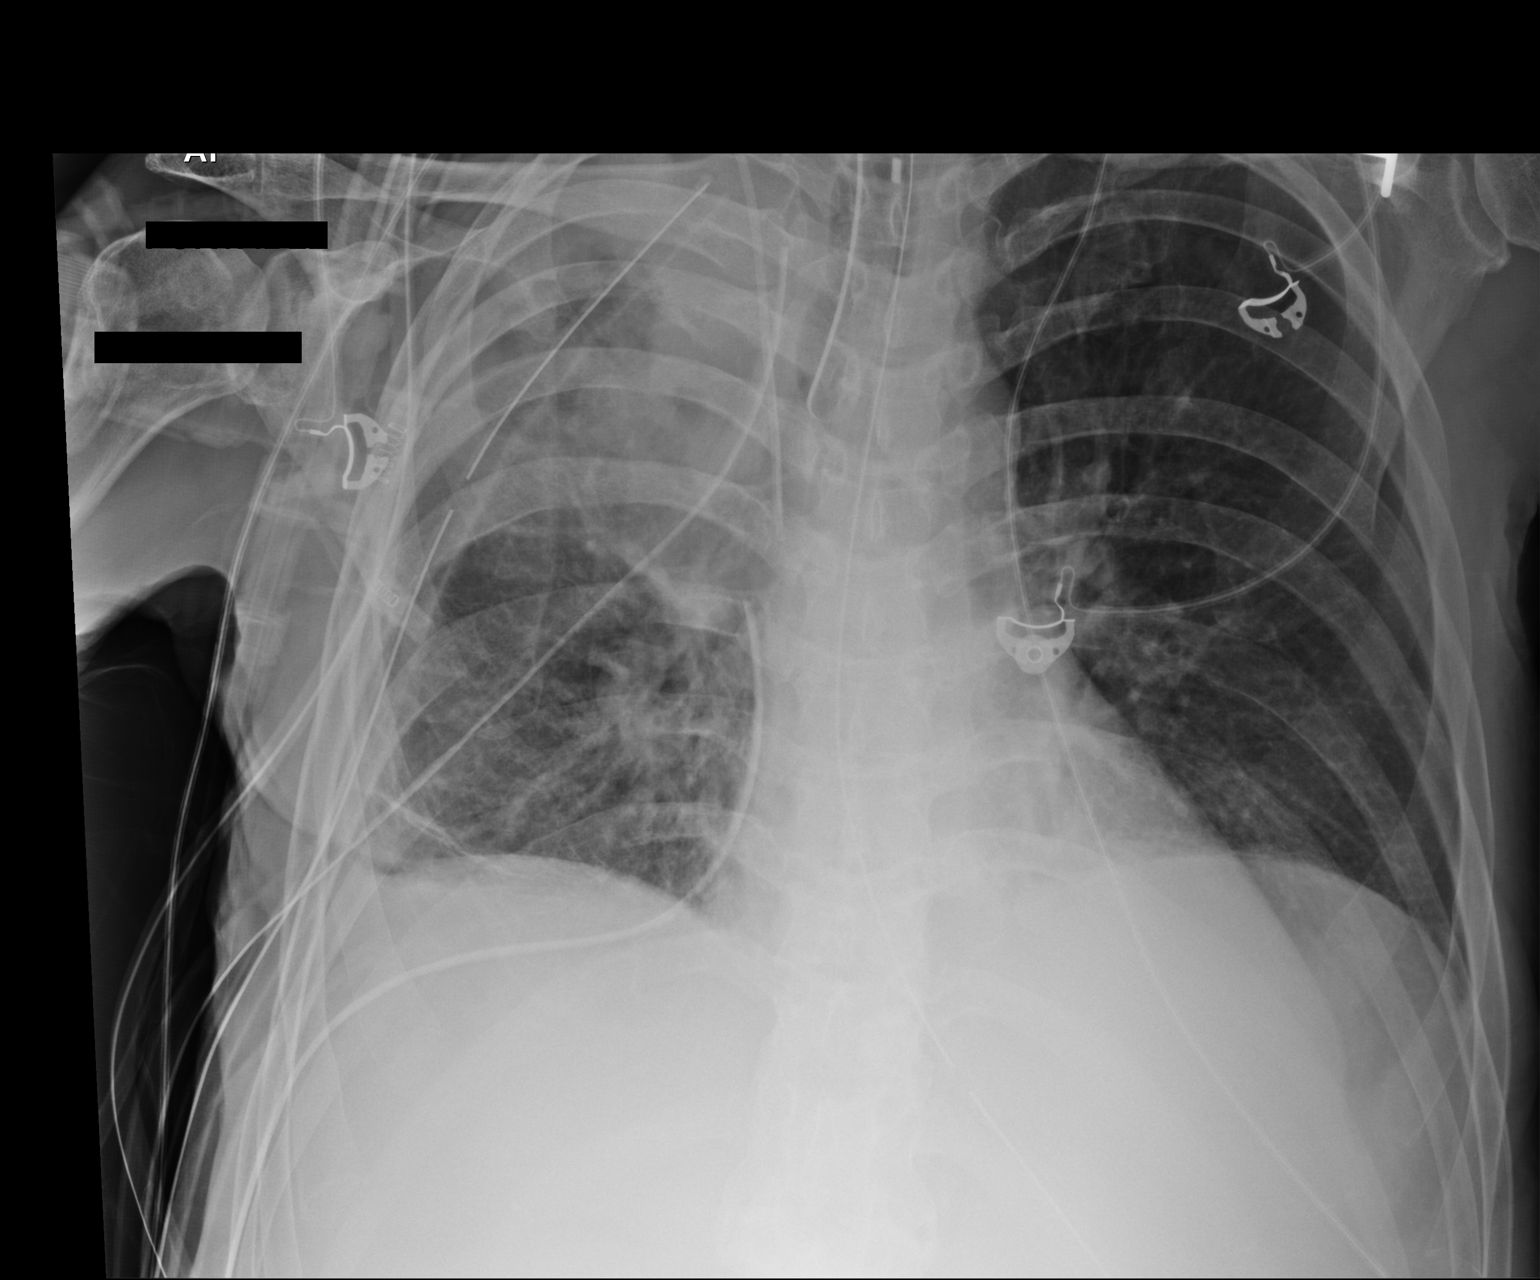

[1 of 1 positions shown; findings below may reference images not displayed]

FINDINGS: Endotracheal tube, NG tube, right IJ line, right chest tubes in
stable position. No pneumothorax. Persistent consolidation of the
right upper lung. Mild right pleural thickening noted. No prominent
pleural effusion. Heart size stable. No acute bony abnormality .
IMPRESSION: 1. Lines and tubes including chest tube in stable position. No
pneumothorax. No prominent pleural effusion. Mild right-sided
pleural thickening noted.

2. Persistent consolidation right upper lung.

## 2018-02-20 IMAGING — CR DG CHEST 1V PORT
1 series · 1 of 1 positions shown · non-contrast
Comparison: Portable chest x-ray October 25, 2016

CLINICAL DATA: Status post thoracoscopy and empyema drainage 2 days
ago. History of COPD, previous CVA common pneumonia.

EXAM:
PORTABLE CHEST 1 VIEW

[AP]
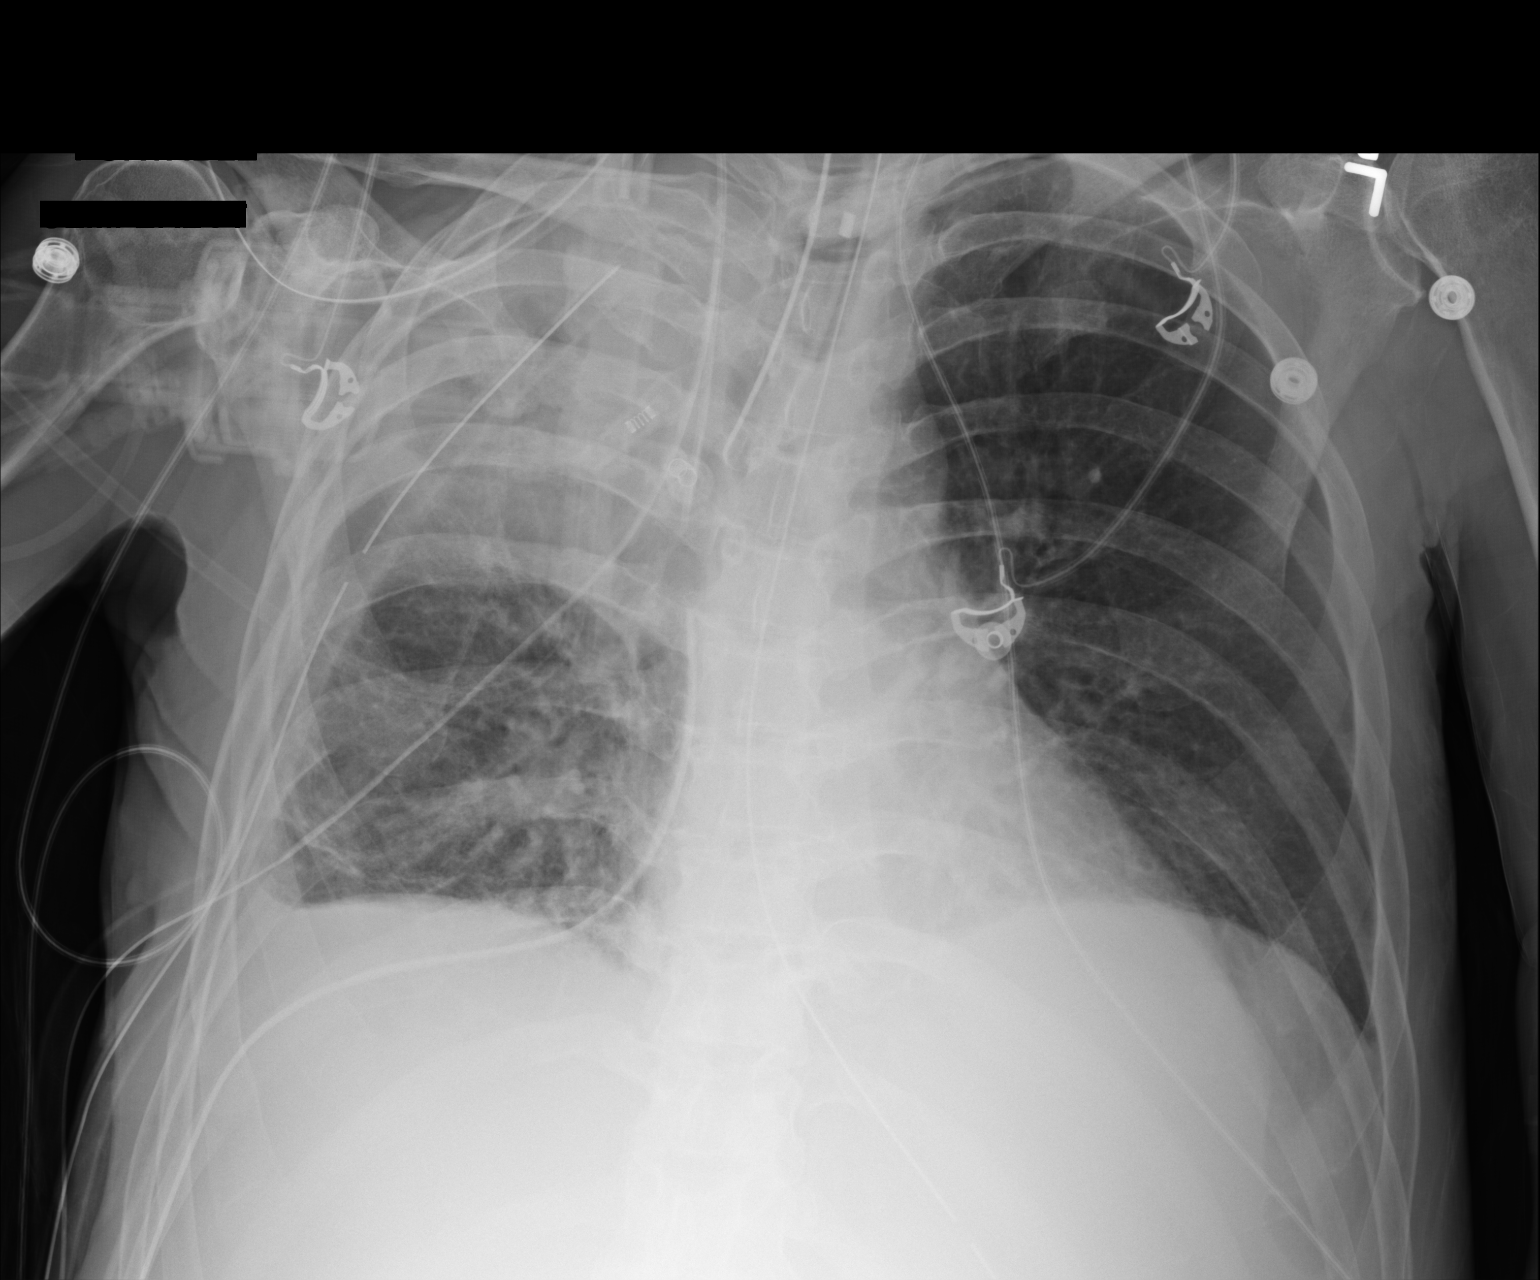

[1 of 1 positions shown; findings below may reference images not displayed]

FINDINGS: Parenchymal density in the upper [DATE] of the right hemithorax
persists. There is no pneumothorax. Two upper chest tubes and the
chest tube in the medial aspect of the mid hemithorax are in stable
position. There is a small amount of pleural fluid at the right lung
base. The left lung is clear. There is mild shift of the mediastinum
toward the right. The heart is normal in size. There is no pulmonary
vascular congestion.

The endotracheal tube is positioned 2.1 cm above the carina. The
esophagogastric tube is in reasonable position. The right internal
jugular venous catheter tip projects over the midportion of the SVC.
IMPRESSION: Persistent partial opacification of the right upper hemithorax
consistent with residual pleural fluid and/or pleural thickening. No
pneumothorax. Persistent coarse interstitial lung markings at the
right lung base with trace right pleural effusion.

The support tubes are in reasonable position.

## 2018-02-21 IMAGING — CR DG CHEST 1V PORT
1 series · 1 of 1 positions shown · non-contrast
Comparison: 10/26/2016.

CLINICAL DATA: Intubation.

EXAM:
PORTABLE CHEST 1 VIEW

[AP]
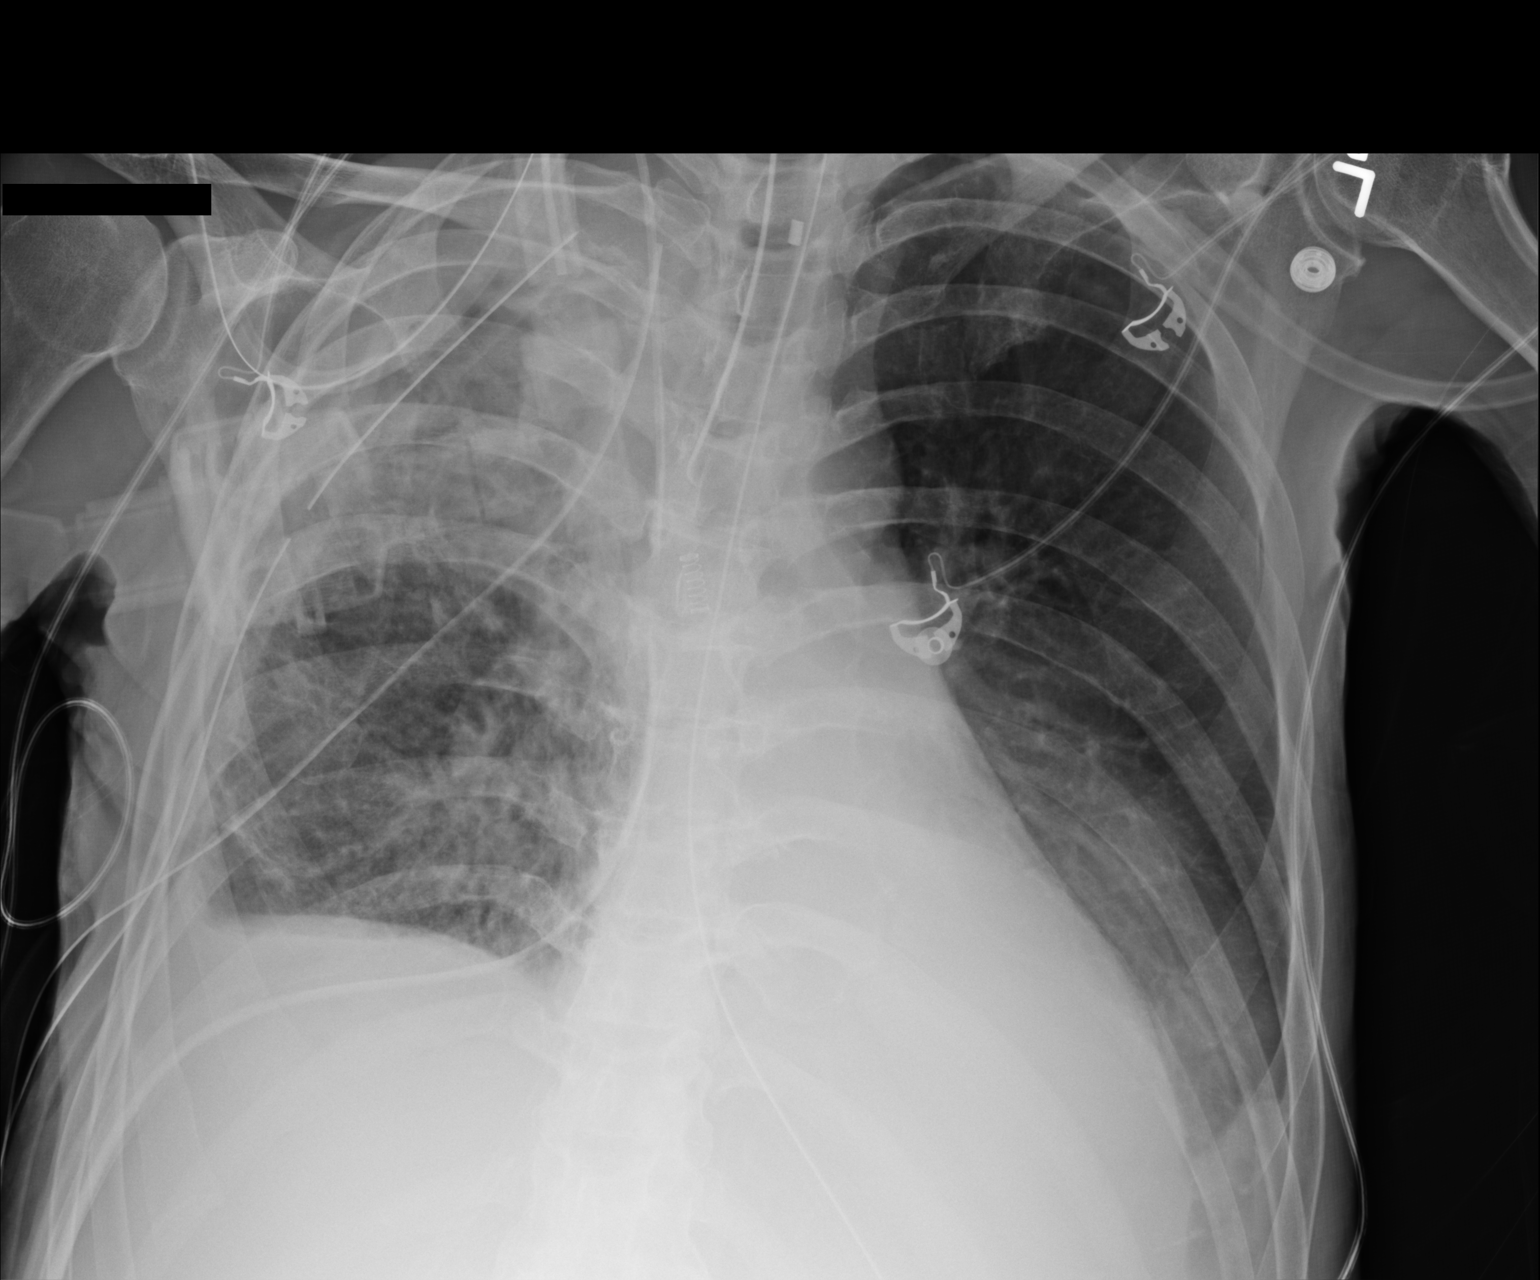

[1 of 1 positions shown; findings below may reference images not displayed]

FINDINGS: Endotracheal tube, NG tube, mediastinal drainage catheter, right IJ
line, right chest tube in stable position. No pneumothorax. Stable
cardiomegaly. Persistent right upper lobe atelectasis with slight
improvement. New onset left lower lobe atelectasis. Bibasilar
infiltrates noted on today's exam. New small left pleural effusion.
Stable small right pleural effusion.
IMPRESSION: 1. Lines and tubes including right chest tube in stable position. No
pneumothorax.

2. Persistent right upper lobe atelectasis with slight improvement.
New onset left lower lobe atelectasis. Bibasilar infiltrates noted
on today's exam. New small left pleural effusion. Stable right
pleural effusion .

## 2018-02-27 IMAGING — CR DG CHEST 1V PORT
1 series · 1 of 1 positions shown · non-contrast
Comparison: 11/01/2016

CLINICAL DATA: Followup right upper lobe infiltrate

EXAM:
PORTABLE CHEST 1 VIEW

[AP]
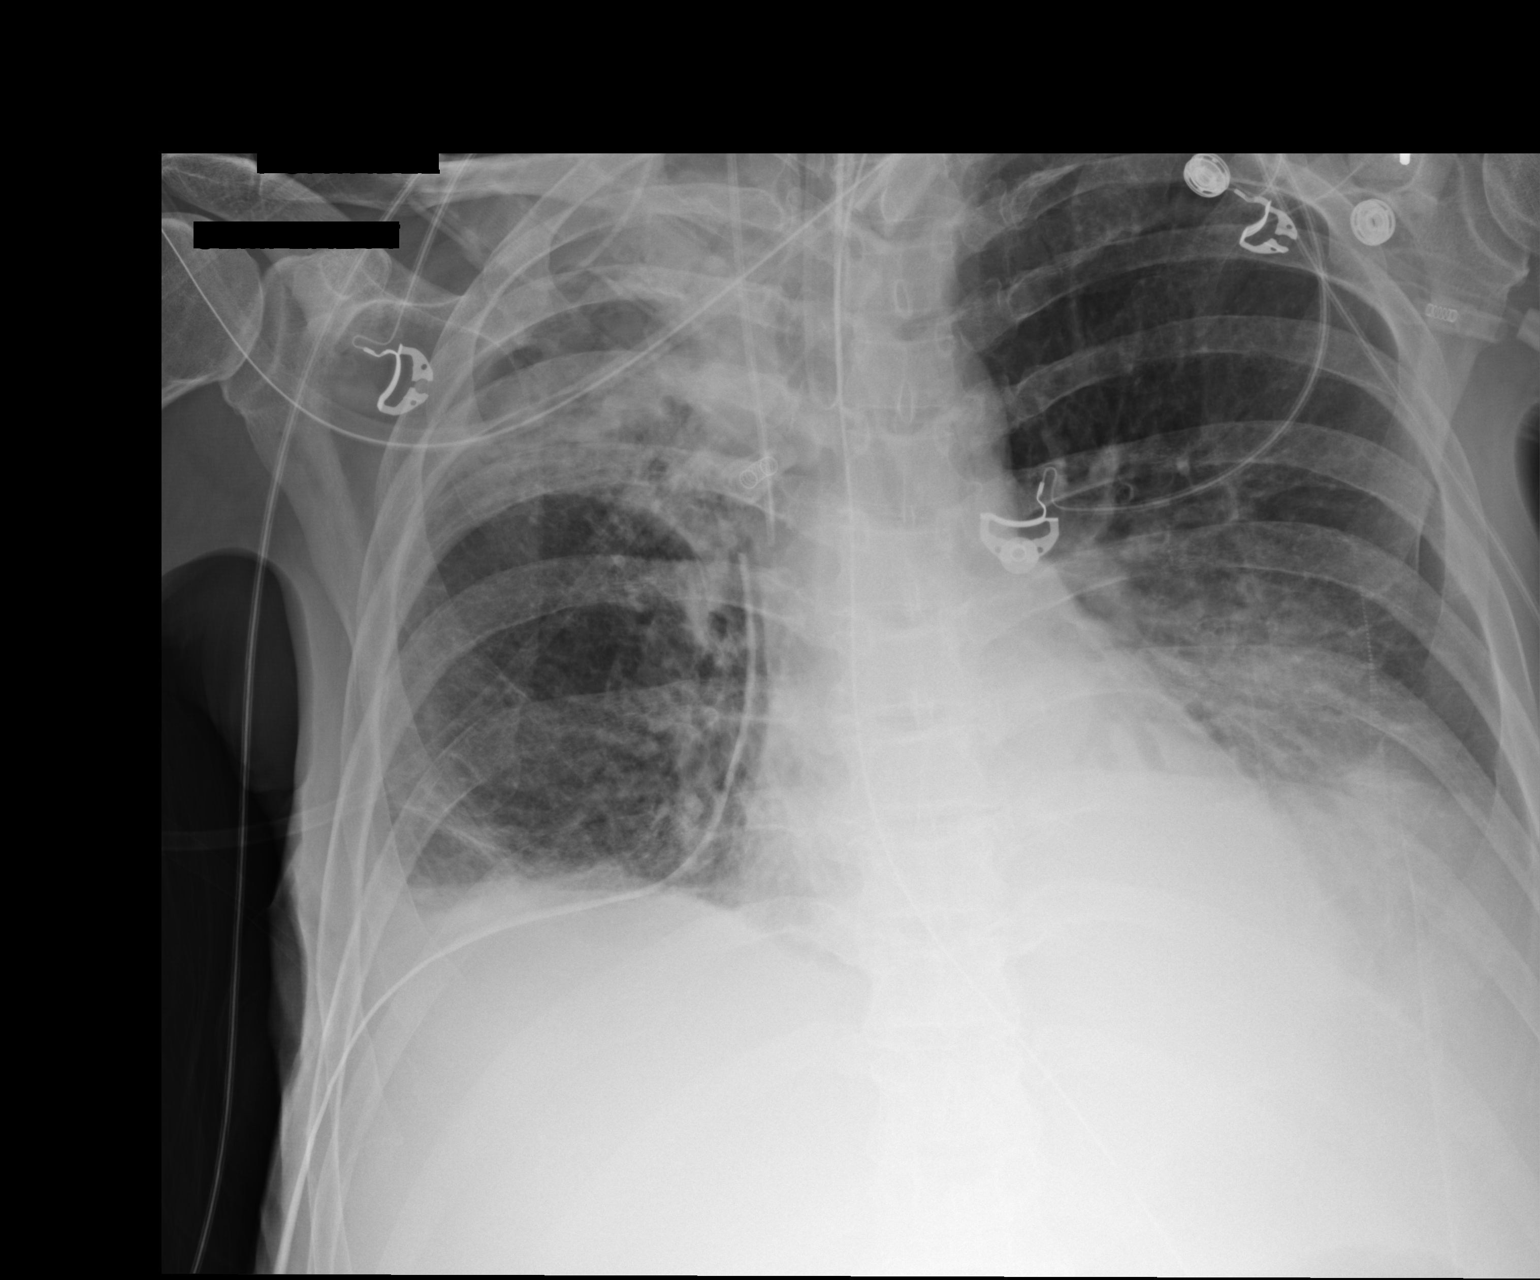

[1 of 1 positions shown; findings below may reference images not displayed]

FINDINGS: Chronic changes in the right upper lobe are again seen. A right
basilar chest tube is noted without evidence of pneumothorax.
Endotracheal tube, nasogastric catheter and right jugular central
line are again seen. Left basilar infiltrate is noted stable from
the prior exam. Right basilar changes are noted as well.
IMPRESSION: No evidence of pneumothorax.

No significant interval change in bilateral airspace disease.

## 2021-08-28 DEATH — deceased
